# Patient Record
Sex: Male | Born: 1950 | Race: White | Hispanic: No | Marital: Married | Smoking: Never smoker
Health system: Southern US, Community
[De-identification: ages and names within clinical notes are randomized; demographics above are authoritative.]

## PROBLEM LIST (undated history)

## (undated) DIAGNOSIS — M1990 Unspecified osteoarthritis, unspecified site: Secondary | ICD-10-CM

## (undated) DIAGNOSIS — E119 Type 2 diabetes mellitus without complications: Secondary | ICD-10-CM

## (undated) DIAGNOSIS — R011 Cardiac murmur, unspecified: Secondary | ICD-10-CM

## (undated) DIAGNOSIS — N200 Calculus of kidney: Secondary | ICD-10-CM

## (undated) DIAGNOSIS — K219 Gastro-esophageal reflux disease without esophagitis: Secondary | ICD-10-CM

## (undated) DIAGNOSIS — R42 Dizziness and giddiness: Secondary | ICD-10-CM

## (undated) DIAGNOSIS — N189 Chronic kidney disease, unspecified: Secondary | ICD-10-CM

## (undated) DIAGNOSIS — E785 Hyperlipidemia, unspecified: Secondary | ICD-10-CM

## (undated) DIAGNOSIS — G4730 Sleep apnea, unspecified: Secondary | ICD-10-CM

## (undated) DIAGNOSIS — G5602 Carpal tunnel syndrome, left upper limb: Secondary | ICD-10-CM

## (undated) HISTORY — DX: Hyperlipidemia, unspecified: E78.5

## (undated) HISTORY — PX: CARPAL TUNNEL RELEASE: SHX101

## (undated) HISTORY — DX: Calculus of kidney: N20.0

## (undated) HISTORY — PX: LAMINECTOMY: SHX219

## (undated) HISTORY — DX: Chronic kidney disease, unspecified: N18.9

## (undated) HISTORY — DX: Sleep apnea, unspecified: G47.30

## (undated) HISTORY — PX: OTHER SURGICAL HISTORY: SHX169

---

## 2005-07-13 ENCOUNTER — Ambulatory Visit: Payer: Self-pay | Admitting: Unknown Physician Specialty

## 2005-08-13 ENCOUNTER — Emergency Department: Payer: Self-pay | Admitting: Emergency Medicine

## 2005-11-28 ENCOUNTER — Ambulatory Visit: Payer: Self-pay

## 2007-02-06 ENCOUNTER — Ambulatory Visit: Payer: Self-pay | Admitting: Nephrology

## 2007-03-12 ENCOUNTER — Ambulatory Visit: Payer: Self-pay | Admitting: Neurosurgery

## 2007-05-01 ENCOUNTER — Other Ambulatory Visit: Payer: Self-pay

## 2007-05-01 ENCOUNTER — Ambulatory Visit: Payer: Self-pay | Admitting: Urology

## 2007-05-05 ENCOUNTER — Ambulatory Visit: Payer: Self-pay | Admitting: Urology

## 2009-12-16 ENCOUNTER — Ambulatory Visit: Payer: Self-pay | Admitting: Urology

## 2010-01-16 ENCOUNTER — Ambulatory Visit: Payer: Self-pay | Admitting: Urology

## 2010-02-22 ENCOUNTER — Ambulatory Visit: Payer: Self-pay | Admitting: Urology

## 2010-03-01 ENCOUNTER — Ambulatory Visit: Payer: Self-pay | Admitting: Urology

## 2010-03-09 ENCOUNTER — Ambulatory Visit: Payer: Self-pay | Admitting: Urology

## 2010-04-10 ENCOUNTER — Ambulatory Visit: Payer: Self-pay | Admitting: Urology

## 2010-08-07 ENCOUNTER — Ambulatory Visit: Payer: Self-pay | Admitting: Urology

## 2010-08-15 ENCOUNTER — Ambulatory Visit: Payer: Self-pay | Admitting: Urology

## 2011-02-15 DIAGNOSIS — E669 Obesity, unspecified: Secondary | ICD-10-CM | POA: Insufficient documentation

## 2012-05-01 ENCOUNTER — Ambulatory Visit: Payer: Self-pay | Admitting: Unknown Physician Specialty

## 2012-05-01 LAB — BASIC METABOLIC PANEL
Anion Gap: 6 — ABNORMAL LOW (ref 7–16)
Co2: 26 mmol/L (ref 21–32)
Creatinine: 1.81 mg/dL — ABNORMAL HIGH (ref 0.60–1.30)
EGFR (African American): 46 — ABNORMAL LOW
EGFR (Non-African Amer.): 40 — ABNORMAL LOW
Glucose: 111 mg/dL — ABNORMAL HIGH (ref 65–99)
Osmolality: 280 (ref 275–301)
Potassium: 4.8 mmol/L (ref 3.5–5.1)

## 2012-05-06 ENCOUNTER — Ambulatory Visit: Payer: Self-pay | Admitting: Unknown Physician Specialty

## 2012-06-26 DIAGNOSIS — K589 Irritable bowel syndrome without diarrhea: Secondary | ICD-10-CM | POA: Insufficient documentation

## 2012-06-26 DIAGNOSIS — I152 Hypertension secondary to endocrine disorders: Secondary | ICD-10-CM | POA: Insufficient documentation

## 2012-06-26 DIAGNOSIS — E119 Type 2 diabetes mellitus without complications: Secondary | ICD-10-CM | POA: Insufficient documentation

## 2012-07-30 DIAGNOSIS — E119 Type 2 diabetes mellitus without complications: Secondary | ICD-10-CM | POA: Diagnosis not present

## 2012-07-30 DIAGNOSIS — K862 Cyst of pancreas: Secondary | ICD-10-CM | POA: Diagnosis not present

## 2012-07-30 DIAGNOSIS — N189 Chronic kidney disease, unspecified: Secondary | ICD-10-CM | POA: Diagnosis not present

## 2012-07-30 DIAGNOSIS — Z79899 Other long term (current) drug therapy: Secondary | ICD-10-CM | POA: Diagnosis not present

## 2012-07-30 DIAGNOSIS — K863 Pseudocyst of pancreas: Secondary | ICD-10-CM | POA: Diagnosis not present

## 2012-08-04 DIAGNOSIS — IMO0002 Reserved for concepts with insufficient information to code with codable children: Secondary | ICD-10-CM | POA: Diagnosis not present

## 2012-08-04 DIAGNOSIS — M5137 Other intervertebral disc degeneration, lumbosacral region: Secondary | ICD-10-CM | POA: Diagnosis not present

## 2012-08-04 DIAGNOSIS — M999 Biomechanical lesion, unspecified: Secondary | ICD-10-CM | POA: Diagnosis not present

## 2012-09-24 DIAGNOSIS — Z23 Encounter for immunization: Secondary | ICD-10-CM | POA: Diagnosis not present

## 2012-10-09 DIAGNOSIS — L821 Other seborrheic keratosis: Secondary | ICD-10-CM | POA: Diagnosis not present

## 2012-10-09 DIAGNOSIS — L919 Hypertrophic disorder of the skin, unspecified: Secondary | ICD-10-CM | POA: Diagnosis not present

## 2012-10-09 DIAGNOSIS — L909 Atrophic disorder of skin, unspecified: Secondary | ICD-10-CM | POA: Diagnosis not present

## 2012-10-09 DIAGNOSIS — L570 Actinic keratosis: Secondary | ICD-10-CM | POA: Diagnosis not present

## 2012-11-03 DIAGNOSIS — L821 Other seborrheic keratosis: Secondary | ICD-10-CM | POA: Diagnosis not present

## 2012-11-03 DIAGNOSIS — L570 Actinic keratosis: Secondary | ICD-10-CM | POA: Diagnosis not present

## 2012-11-03 DIAGNOSIS — L909 Atrophic disorder of skin, unspecified: Secondary | ICD-10-CM | POA: Diagnosis not present

## 2012-11-10 DIAGNOSIS — I10 Essential (primary) hypertension: Secondary | ICD-10-CM | POA: Diagnosis not present

## 2012-11-10 DIAGNOSIS — E119 Type 2 diabetes mellitus without complications: Secondary | ICD-10-CM | POA: Diagnosis not present

## 2012-11-10 DIAGNOSIS — E7800 Pure hypercholesterolemia, unspecified: Secondary | ICD-10-CM | POA: Diagnosis not present

## 2012-12-10 DIAGNOSIS — D485 Neoplasm of uncertain behavior of skin: Secondary | ICD-10-CM | POA: Diagnosis not present

## 2012-12-10 DIAGNOSIS — L820 Inflamed seborrheic keratosis: Secondary | ICD-10-CM | POA: Diagnosis not present

## 2012-12-10 DIAGNOSIS — D2370 Other benign neoplasm of skin of unspecified lower limb, including hip: Secondary | ICD-10-CM | POA: Diagnosis not present

## 2012-12-10 DIAGNOSIS — L821 Other seborrheic keratosis: Secondary | ICD-10-CM | POA: Diagnosis not present

## 2013-01-06 DIAGNOSIS — H6120 Impacted cerumen, unspecified ear: Secondary | ICD-10-CM | POA: Diagnosis not present

## 2013-01-06 DIAGNOSIS — Z0000 Encounter for general adult medical examination without abnormal findings: Secondary | ICD-10-CM | POA: Diagnosis not present

## 2013-01-06 DIAGNOSIS — Z125 Encounter for screening for malignant neoplasm of prostate: Secondary | ICD-10-CM | POA: Diagnosis not present

## 2013-01-06 DIAGNOSIS — N189 Chronic kidney disease, unspecified: Secondary | ICD-10-CM | POA: Diagnosis not present

## 2013-01-06 DIAGNOSIS — B351 Tinea unguium: Secondary | ICD-10-CM | POA: Diagnosis not present

## 2013-01-07 DIAGNOSIS — L905 Scar conditions and fibrosis of skin: Secondary | ICD-10-CM | POA: Diagnosis not present

## 2013-01-07 DIAGNOSIS — L570 Actinic keratosis: Secondary | ICD-10-CM | POA: Diagnosis not present

## 2013-01-07 DIAGNOSIS — L909 Atrophic disorder of skin, unspecified: Secondary | ICD-10-CM | POA: Diagnosis not present

## 2013-01-07 DIAGNOSIS — L919 Hypertrophic disorder of the skin, unspecified: Secondary | ICD-10-CM | POA: Diagnosis not present

## 2013-01-07 DIAGNOSIS — L821 Other seborrheic keratosis: Secondary | ICD-10-CM | POA: Diagnosis not present

## 2013-01-15 DIAGNOSIS — H6500 Acute serous otitis media, unspecified ear: Secondary | ICD-10-CM | POA: Diagnosis not present

## 2013-01-15 DIAGNOSIS — J309 Allergic rhinitis, unspecified: Secondary | ICD-10-CM | POA: Diagnosis not present

## 2013-01-28 DIAGNOSIS — K869 Disease of pancreas, unspecified: Secondary | ICD-10-CM | POA: Diagnosis not present

## 2013-01-28 DIAGNOSIS — K862 Cyst of pancreas: Secondary | ICD-10-CM | POA: Diagnosis not present

## 2013-01-28 DIAGNOSIS — K863 Pseudocyst of pancreas: Secondary | ICD-10-CM | POA: Diagnosis not present

## 2013-02-02 DIAGNOSIS — N183 Chronic kidney disease, stage 3 unspecified: Secondary | ICD-10-CM | POA: Diagnosis not present

## 2013-02-02 DIAGNOSIS — I10 Essential (primary) hypertension: Secondary | ICD-10-CM | POA: Diagnosis not present

## 2013-02-02 DIAGNOSIS — R809 Proteinuria, unspecified: Secondary | ICD-10-CM | POA: Diagnosis not present

## 2013-04-06 DIAGNOSIS — R5381 Other malaise: Secondary | ICD-10-CM | POA: Diagnosis not present

## 2013-04-06 DIAGNOSIS — I889 Nonspecific lymphadenitis, unspecified: Secondary | ICD-10-CM | POA: Diagnosis not present

## 2013-04-06 DIAGNOSIS — J309 Allergic rhinitis, unspecified: Secondary | ICD-10-CM | POA: Diagnosis not present

## 2013-06-23 DIAGNOSIS — M5137 Other intervertebral disc degeneration, lumbosacral region: Secondary | ICD-10-CM | POA: Diagnosis not present

## 2013-06-23 DIAGNOSIS — M999 Biomechanical lesion, unspecified: Secondary | ICD-10-CM | POA: Diagnosis not present

## 2013-06-23 DIAGNOSIS — IMO0002 Reserved for concepts with insufficient information to code with codable children: Secondary | ICD-10-CM | POA: Diagnosis not present

## 2013-06-24 DIAGNOSIS — E119 Type 2 diabetes mellitus without complications: Secondary | ICD-10-CM | POA: Diagnosis not present

## 2013-06-24 DIAGNOSIS — E785 Hyperlipidemia, unspecified: Secondary | ICD-10-CM | POA: Diagnosis not present

## 2013-06-24 DIAGNOSIS — Z79899 Other long term (current) drug therapy: Secondary | ICD-10-CM | POA: Diagnosis not present

## 2013-06-24 DIAGNOSIS — I10 Essential (primary) hypertension: Secondary | ICD-10-CM | POA: Diagnosis not present

## 2013-06-24 DIAGNOSIS — E559 Vitamin D deficiency, unspecified: Secondary | ICD-10-CM | POA: Diagnosis not present

## 2013-07-08 DIAGNOSIS — G729 Myopathy, unspecified: Secondary | ICD-10-CM | POA: Diagnosis not present

## 2013-07-08 DIAGNOSIS — M7710 Lateral epicondylitis, unspecified elbow: Secondary | ICD-10-CM | POA: Diagnosis not present

## 2013-07-22 DIAGNOSIS — E559 Vitamin D deficiency, unspecified: Secondary | ICD-10-CM | POA: Diagnosis not present

## 2013-07-22 DIAGNOSIS — IMO0001 Reserved for inherently not codable concepts without codable children: Secondary | ICD-10-CM | POA: Diagnosis not present

## 2013-07-22 DIAGNOSIS — E785 Hyperlipidemia, unspecified: Secondary | ICD-10-CM | POA: Diagnosis not present

## 2013-07-22 DIAGNOSIS — I10 Essential (primary) hypertension: Secondary | ICD-10-CM | POA: Diagnosis not present

## 2013-09-02 DIAGNOSIS — I10 Essential (primary) hypertension: Secondary | ICD-10-CM | POA: Diagnosis not present

## 2013-09-02 DIAGNOSIS — IMO0001 Reserved for inherently not codable concepts without codable children: Secondary | ICD-10-CM | POA: Diagnosis not present

## 2013-09-02 DIAGNOSIS — E119 Type 2 diabetes mellitus without complications: Secondary | ICD-10-CM | POA: Diagnosis not present

## 2013-09-02 DIAGNOSIS — N189 Chronic kidney disease, unspecified: Secondary | ICD-10-CM | POA: Diagnosis not present

## 2013-09-02 DIAGNOSIS — E785 Hyperlipidemia, unspecified: Secondary | ICD-10-CM | POA: Diagnosis not present

## 2013-09-02 DIAGNOSIS — Z23 Encounter for immunization: Secondary | ICD-10-CM | POA: Diagnosis not present

## 2013-10-20 DIAGNOSIS — N183 Chronic kidney disease, stage 3 unspecified: Secondary | ICD-10-CM | POA: Diagnosis not present

## 2013-10-27 DIAGNOSIS — G4733 Obstructive sleep apnea (adult) (pediatric): Secondary | ICD-10-CM | POA: Diagnosis not present

## 2013-10-27 DIAGNOSIS — I129 Hypertensive chronic kidney disease with stage 1 through stage 4 chronic kidney disease, or unspecified chronic kidney disease: Secondary | ICD-10-CM | POA: Diagnosis not present

## 2013-10-27 DIAGNOSIS — N183 Chronic kidney disease, stage 3 unspecified: Secondary | ICD-10-CM | POA: Diagnosis not present

## 2013-10-27 DIAGNOSIS — E1129 Type 2 diabetes mellitus with other diabetic kidney complication: Secondary | ICD-10-CM | POA: Diagnosis not present

## 2013-12-09 DIAGNOSIS — L819 Disorder of pigmentation, unspecified: Secondary | ICD-10-CM | POA: Diagnosis not present

## 2013-12-09 DIAGNOSIS — L570 Actinic keratosis: Secondary | ICD-10-CM | POA: Diagnosis not present

## 2013-12-09 DIAGNOSIS — L821 Other seborrheic keratosis: Secondary | ICD-10-CM | POA: Diagnosis not present

## 2014-01-08 DIAGNOSIS — Z1331 Encounter for screening for depression: Secondary | ICD-10-CM | POA: Diagnosis not present

## 2014-01-08 DIAGNOSIS — H9190 Unspecified hearing loss, unspecified ear: Secondary | ICD-10-CM | POA: Diagnosis not present

## 2014-01-08 DIAGNOSIS — B350 Tinea barbae and tinea capitis: Secondary | ICD-10-CM | POA: Diagnosis not present

## 2014-01-08 DIAGNOSIS — Z125 Encounter for screening for malignant neoplasm of prostate: Secondary | ICD-10-CM | POA: Diagnosis not present

## 2014-01-08 DIAGNOSIS — J309 Allergic rhinitis, unspecified: Secondary | ICD-10-CM | POA: Diagnosis not present

## 2014-01-08 DIAGNOSIS — Z0000 Encounter for general adult medical examination without abnormal findings: Secondary | ICD-10-CM | POA: Diagnosis not present

## 2014-01-08 DIAGNOSIS — H6120 Impacted cerumen, unspecified ear: Secondary | ICD-10-CM | POA: Diagnosis not present

## 2014-01-27 DIAGNOSIS — R935 Abnormal findings on diagnostic imaging of other abdominal regions, including retroperitoneum: Secondary | ICD-10-CM | POA: Diagnosis not present

## 2014-01-27 DIAGNOSIS — R7989 Other specified abnormal findings of blood chemistry: Secondary | ICD-10-CM | POA: Diagnosis not present

## 2014-01-27 DIAGNOSIS — K862 Cyst of pancreas: Secondary | ICD-10-CM | POA: Diagnosis not present

## 2014-01-27 DIAGNOSIS — K863 Pseudocyst of pancreas: Secondary | ICD-10-CM | POA: Diagnosis not present

## 2014-02-04 DIAGNOSIS — H6500 Acute serous otitis media, unspecified ear: Secondary | ICD-10-CM | POA: Diagnosis not present

## 2014-02-09 DIAGNOSIS — D1801 Hemangioma of skin and subcutaneous tissue: Secondary | ICD-10-CM | POA: Diagnosis not present

## 2014-02-09 DIAGNOSIS — L819 Disorder of pigmentation, unspecified: Secondary | ICD-10-CM | POA: Diagnosis not present

## 2014-02-09 DIAGNOSIS — L821 Other seborrheic keratosis: Secondary | ICD-10-CM | POA: Diagnosis not present

## 2014-02-09 DIAGNOSIS — L570 Actinic keratosis: Secondary | ICD-10-CM | POA: Diagnosis not present

## 2014-03-15 DIAGNOSIS — L570 Actinic keratosis: Secondary | ICD-10-CM | POA: Diagnosis not present

## 2014-03-15 DIAGNOSIS — L820 Inflamed seborrheic keratosis: Secondary | ICD-10-CM | POA: Diagnosis not present

## 2014-03-26 DIAGNOSIS — N183 Chronic kidney disease, stage 3 unspecified: Secondary | ICD-10-CM | POA: Diagnosis not present

## 2014-03-26 DIAGNOSIS — E1129 Type 2 diabetes mellitus with other diabetic kidney complication: Secondary | ICD-10-CM | POA: Diagnosis not present

## 2014-03-26 DIAGNOSIS — E785 Hyperlipidemia, unspecified: Secondary | ICD-10-CM | POA: Diagnosis not present

## 2014-03-26 DIAGNOSIS — I10 Essential (primary) hypertension: Secondary | ICD-10-CM | POA: Diagnosis not present

## 2014-03-26 DIAGNOSIS — I129 Hypertensive chronic kidney disease with stage 1 through stage 4 chronic kidney disease, or unspecified chronic kidney disease: Secondary | ICD-10-CM | POA: Diagnosis not present

## 2014-03-26 DIAGNOSIS — E119 Type 2 diabetes mellitus without complications: Secondary | ICD-10-CM | POA: Diagnosis not present

## 2014-04-12 DIAGNOSIS — L570 Actinic keratosis: Secondary | ICD-10-CM | POA: Diagnosis not present

## 2014-04-12 DIAGNOSIS — L909 Atrophic disorder of skin, unspecified: Secondary | ICD-10-CM | POA: Diagnosis not present

## 2014-04-12 DIAGNOSIS — L821 Other seborrheic keratosis: Secondary | ICD-10-CM | POA: Diagnosis not present

## 2014-04-12 DIAGNOSIS — L919 Hypertrophic disorder of the skin, unspecified: Secondary | ICD-10-CM | POA: Diagnosis not present

## 2014-04-21 DIAGNOSIS — E1129 Type 2 diabetes mellitus with other diabetic kidney complication: Secondary | ICD-10-CM | POA: Diagnosis not present

## 2014-04-21 DIAGNOSIS — N183 Chronic kidney disease, stage 3 unspecified: Secondary | ICD-10-CM | POA: Diagnosis not present

## 2014-04-27 DIAGNOSIS — N184 Chronic kidney disease, stage 4 (severe): Secondary | ICD-10-CM | POA: Diagnosis not present

## 2014-04-27 DIAGNOSIS — E1129 Type 2 diabetes mellitus with other diabetic kidney complication: Secondary | ICD-10-CM | POA: Diagnosis not present

## 2014-04-27 DIAGNOSIS — N058 Unspecified nephritic syndrome with other morphologic changes: Secondary | ICD-10-CM | POA: Diagnosis not present

## 2014-05-24 DIAGNOSIS — L820 Inflamed seborrheic keratosis: Secondary | ICD-10-CM | POA: Diagnosis not present

## 2014-05-24 DIAGNOSIS — L570 Actinic keratosis: Secondary | ICD-10-CM | POA: Diagnosis not present

## 2014-10-25 DIAGNOSIS — N184 Chronic kidney disease, stage 4 (severe): Secondary | ICD-10-CM | POA: Diagnosis not present

## 2014-10-25 DIAGNOSIS — E1129 Type 2 diabetes mellitus with other diabetic kidney complication: Secondary | ICD-10-CM | POA: Diagnosis not present

## 2014-11-01 DIAGNOSIS — R809 Proteinuria, unspecified: Secondary | ICD-10-CM | POA: Diagnosis not present

## 2014-11-01 DIAGNOSIS — I129 Hypertensive chronic kidney disease with stage 1 through stage 4 chronic kidney disease, or unspecified chronic kidney disease: Secondary | ICD-10-CM | POA: Diagnosis not present

## 2014-11-01 DIAGNOSIS — N183 Chronic kidney disease, stage 3 (moderate): Secondary | ICD-10-CM | POA: Diagnosis not present

## 2014-11-01 DIAGNOSIS — E1129 Type 2 diabetes mellitus with other diabetic kidney complication: Secondary | ICD-10-CM | POA: Diagnosis not present

## 2015-02-02 DIAGNOSIS — R7989 Other specified abnormal findings of blood chemistry: Secondary | ICD-10-CM | POA: Diagnosis not present

## 2015-02-02 DIAGNOSIS — K869 Disease of pancreas, unspecified: Secondary | ICD-10-CM | POA: Diagnosis not present

## 2015-02-02 DIAGNOSIS — K760 Fatty (change of) liver, not elsewhere classified: Secondary | ICD-10-CM | POA: Diagnosis not present

## 2015-02-02 DIAGNOSIS — K863 Pseudocyst of pancreas: Secondary | ICD-10-CM | POA: Diagnosis not present

## 2015-02-02 DIAGNOSIS — K862 Cyst of pancreas: Secondary | ICD-10-CM | POA: Diagnosis not present

## 2015-03-22 DIAGNOSIS — E119 Type 2 diabetes mellitus without complications: Secondary | ICD-10-CM | POA: Diagnosis not present

## 2015-03-22 DIAGNOSIS — K3184 Gastroparesis: Secondary | ICD-10-CM | POA: Diagnosis not present

## 2015-03-22 DIAGNOSIS — E1129 Type 2 diabetes mellitus with other diabetic kidney complication: Secondary | ICD-10-CM | POA: Diagnosis not present

## 2015-03-22 DIAGNOSIS — R109 Unspecified abdominal pain: Secondary | ICD-10-CM | POA: Diagnosis not present

## 2015-03-22 DIAGNOSIS — E785 Hyperlipidemia, unspecified: Secondary | ICD-10-CM | POA: Diagnosis not present

## 2015-03-22 DIAGNOSIS — K219 Gastro-esophageal reflux disease without esophagitis: Secondary | ICD-10-CM | POA: Diagnosis not present

## 2015-03-22 DIAGNOSIS — N183 Chronic kidney disease, stage 3 (moderate): Secondary | ICD-10-CM | POA: Diagnosis not present

## 2015-03-22 DIAGNOSIS — I129 Hypertensive chronic kidney disease with stage 1 through stage 4 chronic kidney disease, or unspecified chronic kidney disease: Secondary | ICD-10-CM | POA: Diagnosis not present

## 2015-04-20 DIAGNOSIS — N529 Male erectile dysfunction, unspecified: Secondary | ICD-10-CM | POA: Diagnosis not present

## 2015-04-20 DIAGNOSIS — J309 Allergic rhinitis, unspecified: Secondary | ICD-10-CM | POA: Diagnosis not present

## 2015-04-20 DIAGNOSIS — R109 Unspecified abdominal pain: Secondary | ICD-10-CM | POA: Diagnosis not present

## 2015-04-20 DIAGNOSIS — K219 Gastro-esophageal reflux disease without esophagitis: Secondary | ICD-10-CM | POA: Diagnosis not present

## 2015-05-02 DIAGNOSIS — N183 Chronic kidney disease, stage 3 (moderate): Secondary | ICD-10-CM | POA: Diagnosis not present

## 2015-05-04 DIAGNOSIS — N183 Chronic kidney disease, stage 3 (moderate): Secondary | ICD-10-CM | POA: Diagnosis not present

## 2015-05-04 DIAGNOSIS — I129 Hypertensive chronic kidney disease with stage 1 through stage 4 chronic kidney disease, or unspecified chronic kidney disease: Secondary | ICD-10-CM | POA: Diagnosis not present

## 2015-05-04 DIAGNOSIS — E559 Vitamin D deficiency, unspecified: Secondary | ICD-10-CM | POA: Diagnosis not present

## 2015-05-04 DIAGNOSIS — Z6836 Body mass index (BMI) 36.0-36.9, adult: Secondary | ICD-10-CM | POA: Diagnosis not present

## 2015-06-07 DIAGNOSIS — E1129 Type 2 diabetes mellitus with other diabetic kidney complication: Secondary | ICD-10-CM | POA: Insufficient documentation

## 2015-06-07 DIAGNOSIS — N529 Male erectile dysfunction, unspecified: Secondary | ICD-10-CM | POA: Insufficient documentation

## 2015-06-07 DIAGNOSIS — N200 Calculus of kidney: Secondary | ICD-10-CM

## 2015-06-07 DIAGNOSIS — E559 Vitamin D deficiency, unspecified: Secondary | ICD-10-CM

## 2015-06-07 DIAGNOSIS — J309 Allergic rhinitis, unspecified: Secondary | ICD-10-CM

## 2015-06-07 DIAGNOSIS — K219 Gastro-esophageal reflux disease without esophagitis: Secondary | ICD-10-CM

## 2015-06-07 DIAGNOSIS — E785 Hyperlipidemia, unspecified: Secondary | ICD-10-CM

## 2015-06-07 DIAGNOSIS — M545 Low back pain: Secondary | ICD-10-CM

## 2015-06-07 DIAGNOSIS — I129 Hypertensive chronic kidney disease with stage 1 through stage 4 chronic kidney disease, or unspecified chronic kidney disease: Secondary | ICD-10-CM

## 2015-06-07 DIAGNOSIS — E1169 Type 2 diabetes mellitus with other specified complication: Secondary | ICD-10-CM | POA: Insufficient documentation

## 2015-06-07 DIAGNOSIS — E1140 Type 2 diabetes mellitus with diabetic neuropathy, unspecified: Secondary | ICD-10-CM | POA: Insufficient documentation

## 2015-06-07 DIAGNOSIS — B359 Dermatophytosis, unspecified: Secondary | ICD-10-CM

## 2015-06-07 DIAGNOSIS — N183 Chronic kidney disease, stage 3 unspecified: Secondary | ICD-10-CM

## 2015-06-07 DIAGNOSIS — N411 Chronic prostatitis: Secondary | ICD-10-CM

## 2015-06-07 DIAGNOSIS — E1322 Other specified diabetes mellitus with diabetic chronic kidney disease: Secondary | ICD-10-CM

## 2015-06-07 DIAGNOSIS — M961 Postlaminectomy syndrome, not elsewhere classified: Secondary | ICD-10-CM | POA: Insufficient documentation

## 2015-06-15 ENCOUNTER — Ambulatory Visit: Payer: Self-pay | Admitting: Unknown Physician Specialty

## 2015-07-19 ENCOUNTER — Ambulatory Visit (INDEPENDENT_AMBULATORY_CARE_PROVIDER_SITE_OTHER): Payer: Medicare Other | Admitting: Family Medicine

## 2015-07-19 ENCOUNTER — Encounter: Payer: Self-pay | Admitting: Family Medicine

## 2015-07-19 VITALS — BP 138/81 | HR 93 | Temp 98.7°F | Ht 66.2 in | Wt 227.9 lb

## 2015-07-19 DIAGNOSIS — G4733 Obstructive sleep apnea (adult) (pediatric): Secondary | ICD-10-CM

## 2015-07-19 DIAGNOSIS — J0110 Acute frontal sinusitis, unspecified: Secondary | ICD-10-CM

## 2015-07-19 DIAGNOSIS — Z9989 Dependence on other enabling machines and devices: Secondary | ICD-10-CM

## 2015-07-19 MED ORDER — AMOXICILLIN-POT CLAVULANATE 875-125 MG PO TABS: 1.0000 | ORAL_TABLET | Freq: Two times a day (BID) | ORAL | Status: DC

## 2015-07-19 MED ORDER — HYDROCOD POLST-CPM POLST ER 10-8 MG/5ML PO SUER: 5.0000 mL | Freq: Two times a day (BID) | ORAL | Status: DC | PRN

## 2015-07-19 NOTE — Assessment & Plan Note (Signed)
Needs refill on mask and tubing. Rx given today.

## 2015-07-19 NOTE — Progress Notes (Signed)
BP 138/81 mmHg  Pulse 93  Temp(Src) 98.7 F (37.1 C)  Ht 5' 6.2" (1.681 m)  Wt 227 lb 14.4 oz (103.375 kg)  BMI 36.58 kg/m2  SpO2 97%   Subjective:    Patient ID: Thomas Huang, male    DOB: August 30, 1951, 64 y.o.   MRN: [id number]  HPI: Thomas Huang is a 64 y.o. male  Chief Complaint  Patient presents with  . URI    X 3.5 weeks, sinus pressure, sore throat, earache, cough, congestion. Used otc cough meds   UPPER RESPIRATORY TRACT INFECTION x3.5 weeks, started with a headaches, then went into his ears, now congested everywhere and getting worse Worst symptom: cough Fever: no Cough: yes Shortness of breath: no Wheezing: no Chest pain: no Chest tightness: no Chest congestion: yes Nasal congestion: yes Runny nose: yes Post nasal drip: yes Sneezing: no Sore throat: yes Swollen glands: yes Sinus pressure: yes Headache: yes Face pain: yes Toothache: no Ear pain: yes "right Ear pressure: yes "right Eyes red/itching:no Eye drainage/crusting: no  Vomiting: no Rash: no Fatigue: yes Sick contacts: no Strep contacts: no  Context: worse Recurrent sinusitis: yes Relief with OTC cold/cough medications: no  Treatments attempted: cold/sinus, mucinex, anti-histamine, pseudoephedrine and cough syrup   Relevant past medical, surgical, family and social history reviewed and updated as indicated. Interim medical history since our last visit reviewed. Allergies and medications reviewed and updated.  Review of Systems  Constitutional: Negative.   HENT: Positive for congestion, ear pain, postnasal drip, rhinorrhea, sinus pressure, sneezing and sore throat. Negative for dental problem, drooling, ear discharge, facial swelling, hearing loss, mouth sores, nosebleeds, tinnitus, trouble swallowing and voice change.   Respiratory: Negative.   Cardiovascular: Negative.   Psychiatric/Behavioral: Negative.     Per HPI unless specifically indicated above     Objective:    BP 138/81  mmHg  Pulse 93  Temp(Src) 98.7 F (37.1 C)  Ht 5' 6.2" (1.681 m)  Wt 227 lb 14.4 oz (103.375 kg)  BMI 36.58 kg/m2  SpO2 97%  Wt Readings from Last 3 Encounters:  07/19/15 227 lb 14.4 oz (103.375 kg)  04/20/15 225 lb (102.059 kg)    Physical Exam  Constitutional: He is oriented to person, place, and time. He appears well-developed and well-nourished. No distress.  HENT:  Head: Normocephalic and atraumatic.  Right Ear: Hearing, tympanic membrane, external ear and ear canal normal.  Left Ear: Hearing, tympanic membrane, external ear and ear canal normal.  Nose: Mucosal edema, rhinorrhea and sinus tenderness present. No nose lacerations, nasal deformity, septal deviation or nasal septal hematoma. No epistaxis.  No foreign bodies. Right sinus exhibits no maxillary sinus tenderness and no frontal sinus tenderness. Left sinus exhibits frontal sinus tenderness. Left sinus exhibits no maxillary sinus tenderness.  Mouth/Throat: Uvula is midline and oropharynx is clear and moist. No oropharyngeal exudate.  Eyes: Conjunctivae, EOM and lids are normal. Pupils are equal, round, and reactive to light. Right eye exhibits no discharge. Left eye exhibits no discharge. No scleral icterus.  Cardiovascular: Normal rate, regular rhythm, normal heart sounds and intact distal pulses.  Exam reveals no gallop and no friction rub.   No murmur heard. Pulmonary/Chest: Effort normal and breath sounds normal. No respiratory distress. He has no wheezes. He has no rales. He exhibits no tenderness.  Musculoskeletal: Normal range of motion.  Neurological: He is alert and oriented to person, place, and time.  Skin: Skin is warm, dry and intact. No rash noted. No erythema. No  pallor.  Psychiatric: He has a normal mood and affect. His speech is normal and behavior is normal. Judgment and thought content normal. Cognition and memory are normal.  Nursing note and vitals reviewed.       Assessment & Plan:   Problem List  Items Addressed This Visit      Respiratory   OSA on CPAP    Needs refill on mask and tubing. Rx given today.       Other Visit Diagnoses    Acute frontal sinusitis, recurrence not specified    -  Primary    Will treat with augmentin and tussionex for comfort. Call if not improving. Continue to monitor.     Relevant Medications    amoxicillin-clavulanate (AUGMENTIN) 875-125 MG per tablet    chlorpheniramine-HYDROcodone (TUSSIONEX PENNKINETIC ER) 10-8 MG/5ML SUER        Follow up plan: Return if symptoms worsen or fail to improve.

## 2015-08-31 DIAGNOSIS — M4806 Spinal stenosis, lumbar region: Secondary | ICD-10-CM | POA: Diagnosis not present

## 2015-09-08 ENCOUNTER — Other Ambulatory Visit: Payer: Self-pay | Admitting: Family Medicine

## 2015-09-08 DIAGNOSIS — M25552 Pain in left hip: Secondary | ICD-10-CM | POA: Diagnosis not present

## 2015-09-08 DIAGNOSIS — M7061 Trochanteric bursitis, right hip: Secondary | ICD-10-CM | POA: Diagnosis not present

## 2015-09-08 DIAGNOSIS — M25551 Pain in right hip: Secondary | ICD-10-CM | POA: Diagnosis not present

## 2015-09-08 DIAGNOSIS — M7062 Trochanteric bursitis, left hip: Secondary | ICD-10-CM | POA: Diagnosis not present

## 2015-09-08 DIAGNOSIS — M5416 Radiculopathy, lumbar region: Secondary | ICD-10-CM | POA: Diagnosis not present

## 2015-09-08 DIAGNOSIS — M545 Low back pain: Secondary | ICD-10-CM | POA: Diagnosis not present

## 2015-09-12 DIAGNOSIS — M25551 Pain in right hip: Secondary | ICD-10-CM | POA: Diagnosis not present

## 2015-09-12 DIAGNOSIS — M5416 Radiculopathy, lumbar region: Secondary | ICD-10-CM | POA: Diagnosis not present

## 2015-09-12 DIAGNOSIS — M25552 Pain in left hip: Secondary | ICD-10-CM | POA: Diagnosis not present

## 2015-09-15 DIAGNOSIS — M7061 Trochanteric bursitis, right hip: Secondary | ICD-10-CM | POA: Diagnosis not present

## 2015-09-15 DIAGNOSIS — M545 Low back pain: Secondary | ICD-10-CM | POA: Diagnosis not present

## 2015-09-15 DIAGNOSIS — M7062 Trochanteric bursitis, left hip: Secondary | ICD-10-CM | POA: Diagnosis not present

## 2015-09-15 DIAGNOSIS — M5417 Radiculopathy, lumbosacral region: Secondary | ICD-10-CM | POA: Diagnosis not present

## 2015-09-19 DIAGNOSIS — M545 Low back pain: Secondary | ICD-10-CM | POA: Diagnosis not present

## 2015-09-28 ENCOUNTER — Other Ambulatory Visit: Payer: Self-pay | Admitting: Family Medicine

## 2015-09-28 NOTE — Telephone Encounter (Signed)
Call for an apt

## 2015-09-29 NOTE — Telephone Encounter (Signed)
L/M for pt to call and scheduled appt.

## 2015-09-30 NOTE — Telephone Encounter (Signed)
Pt scheduled  

## 2015-10-03 DIAGNOSIS — M25551 Pain in right hip: Secondary | ICD-10-CM | POA: Diagnosis not present

## 2015-10-03 DIAGNOSIS — M5416 Radiculopathy, lumbar region: Secondary | ICD-10-CM | POA: Diagnosis not present

## 2015-10-03 DIAGNOSIS — M25552 Pain in left hip: Secondary | ICD-10-CM | POA: Diagnosis not present

## 2015-10-05 ENCOUNTER — Other Ambulatory Visit: Payer: Self-pay | Admitting: Specialist

## 2015-10-05 DIAGNOSIS — M5416 Radiculopathy, lumbar region: Secondary | ICD-10-CM

## 2015-10-05 DIAGNOSIS — M71559 Other bursitis, not elsewhere classified, unspecified hip: Secondary | ICD-10-CM | POA: Diagnosis not present

## 2015-10-06 ENCOUNTER — Encounter: Payer: Self-pay | Admitting: Family Medicine

## 2015-10-06 ENCOUNTER — Ambulatory Visit (INDEPENDENT_AMBULATORY_CARE_PROVIDER_SITE_OTHER): Payer: Medicare Other | Admitting: Family Medicine

## 2015-10-06 VITALS — BP 118/69 | HR 76 | Temp 98.6°F | Ht 66.0 in | Wt 224.0 lb

## 2015-10-06 DIAGNOSIS — I129 Hypertensive chronic kidney disease with stage 1 through stage 4 chronic kidney disease, or unspecified chronic kidney disease: Secondary | ICD-10-CM | POA: Diagnosis not present

## 2015-10-06 DIAGNOSIS — S61401A Unspecified open wound of right hand, initial encounter: Secondary | ICD-10-CM

## 2015-10-06 DIAGNOSIS — Z87828 Personal history of other (healed) physical injury and trauma: Secondary | ICD-10-CM | POA: Diagnosis not present

## 2015-10-06 DIAGNOSIS — E1122 Type 2 diabetes mellitus with diabetic chronic kidney disease: Secondary | ICD-10-CM

## 2015-10-06 DIAGNOSIS — Z23 Encounter for immunization: Secondary | ICD-10-CM | POA: Diagnosis not present

## 2015-10-06 DIAGNOSIS — J329 Chronic sinusitis, unspecified: Secondary | ICD-10-CM

## 2015-10-06 DIAGNOSIS — E785 Hyperlipidemia, unspecified: Secondary | ICD-10-CM

## 2015-10-06 DIAGNOSIS — N183 Chronic kidney disease, stage 3 (moderate): Secondary | ICD-10-CM | POA: Diagnosis not present

## 2015-10-06 LAB — LIPID PANEL PICCOLO, WAIVED
CHOLESTEROL PICCOLO, WAIVED: 158 mg/dL (ref <200)
Chol/HDL Ratio Piccolo,Waive: 3.8 mg/dL
HDL CHOL PICCOLO, WAIVED: 41 mg/dL — AB (ref >59)
LDL CHOL CALC PICCOLO WAIVED: 93 mg/dL (ref <100)
Triglycerides Piccolo,Waived: 120 mg/dL (ref <150)
VLDL Chol Calc Piccolo,Waive: 24 mg/dL (ref <30)

## 2015-10-06 LAB — BAYER DCA HB A1C WAIVED: HB A1C (BAYER DCA - WAIVED): 7 % — ABNORMAL HIGH (ref <7.0)

## 2015-10-06 LAB — MICROALBUMIN, URINE WAIVED
Creatinine, Urine Waived: 100 mg/dL (ref 10–300)
Microalb, Ur Waived: 80 mg/L — ABNORMAL HIGH (ref 0–19)

## 2015-10-06 MED ORDER — SILDENAFIL CITRATE 20 MG PO TABS: 20.0000 mg | ORAL_TABLET | Freq: Once | ORAL | Status: DC | PRN

## 2015-10-06 MED ORDER — AMOXICILLIN-POT CLAVULANATE 875-125 MG PO TABS
1.0000 | ORAL_TABLET | Freq: Two times a day (BID) | ORAL | Status: DC
Start: 2015-10-06 — End: 2016-02-02

## 2015-10-06 MED ORDER — GLIMEPIRIDE 2 MG PO TABS: 2.0000 mg | ORAL_TABLET | Freq: Every day | ORAL | Status: DC

## 2015-10-06 NOTE — Assessment & Plan Note (Signed)
The current medical regimen is effective;  continue present plan and medications.  

## 2015-10-06 NOTE — Progress Notes (Signed)
BP 118/69 mmHg  Pulse 76  Temp(Src) 98.6 F (37 C)  Ht 5\' 6"  (1.676 m)  Wt 224 lb (101.606 kg)  BMI 36.17 kg/m2  SpO2 98%   Subjective:    Patient ID: Thomas Huang, male    DOB: 01-10-1951, 64 y.o.   MRN: [id number]  HPI: [name] is a 64 y.o. male  Chief Complaint  Patient presents with  . Hypertension  . Hyperlipidemia  . Diabetes   recheck multiple medical issues blood pressures doing well with no complaints from medications no side effects Cholesterol taking medications without problems or side effects Diabetes good control at home except for yesterday after had steroid injection for hip pain where he is seeing orthopedics Patient also sinus infections and this last summer, little bit better after treatment but is been intermittently problematic with sloshing headache especially and frontal sinuses which is been ongoing intermittently all fall. On has not taken any over-the-counter medications  Relevant past medical, surgical, family and social history reviewed and updated as indicated. Interim medical history since our last visit reviewed. Allergies and medications reviewed and updated.  Review of Systems  Constitutional: Negative.   HENT: Positive for congestion, postnasal drip, rhinorrhea, sinus pressure and sore throat. Negative for ear pain, facial swelling and hearing loss.   Respiratory: Negative.   Cardiovascular: Negative.     Per HPI unless specifically indicated above     Objective:    BP 118/69 mmHg  Pulse 76  Temp(Src) 98.6 F (37 C)  Ht 5\' 6"  (1.676 m)  Wt 224 lb (101.606 kg)  BMI 36.17 kg/m2  SpO2 98%  Wt Readings from Last 3 Encounters:  10/06/15 224 lb (101.606 kg)  07/19/15 227 lb 14.4 oz (103.375 kg)  04/20/15 225 lb (102.059 kg)    Physical Exam  Constitutional: He is oriented to person, place, and time. He appears well-developed and well-nourished. No distress.  HENT:  Head: Normocephalic and atraumatic.  Right Ear: Hearing  and external ear normal.  Left Ear: Hearing and external ear normal.  Nose: Nose normal.  Mouth/Throat: Oropharynx is clear and moist.  Eyes: Conjunctivae and lids are normal. Right eye exhibits no discharge. Left eye exhibits no discharge. No scleral icterus.  Neck: Normal range of motion.  Cardiovascular: Normal rate, regular rhythm and normal heart sounds.   Pulmonary/Chest: Effort normal and breath sounds normal. No respiratory distress.  Musculoskeletal: Normal range of motion.  Patient with scrape on the right hand happened 3 days ago appears clean and well but needs a tetanus shot.  Lymphadenopathy:    He has no cervical adenopathy.  Neurological: He is alert and oriented to person, place, and time.  Skin: Skin is intact. No rash noted.  Psychiatric: He has a normal mood and affect. His speech is normal and behavior is normal. Judgment and thought content normal. Cognition and memory are normal.        Assessment & Plan:   Problem List Items Addressed This Visit      Respiratory   Sinusitis    Discussed sinusitis care and treatment restart Augmentin for another 10 days Discussed treating nasal allergies faithfully with Flonase which patient has Also use Allegra or Claritin      Relevant Medications   amoxicillin-clavulanate (AUGMENTIN) 875-125 MG tablet     Endocrine   Diabetes mellitus with renal manifestation ([place]) - Primary    The current medical regimen is effective;  continue present plan and medications.  Relevant Medications   glimepiride (AMARYL) 2 MG tablet   Other Relevant Orders   Bayer DCA Hb A1c Waived     Genitourinary   Hypertensive CKD (chronic kidney disease)    The current medical regimen is effective;  continue present plan and medications.         Other   Hyperlipidemia    The current medical regimen is effective;  continue present plan and medications.       Relevant Medications   sildenafil (REVATIO) 20 MG tablet   Hx of  injury   H/O open hand wound    Healing well will need tetanus shot       Other Visit Diagnoses    Parenchymal renal hypertension, stage 1-4 or unspecified chronic kidney disease        Relevant Medications    sildenafil (REVATIO) 20 MG tablet    Other Relevant Orders    Comprehensive metabolic panel    Microalbumin, Urine Waived    Uric acid    Hyperlipemia        Relevant Medications    sildenafil (REVATIO) 20 MG tablet    Other Relevant Orders    Lipid Panel Piccolo, Waived    Immunization due        Relevant Orders    Flu Vaccine QUAD 36+ mos PF IM (Fluarix & Fluzone Quad PF) (Completed)        Follow up plan: Return in about 3 months (around 01/06/2016), or if symptoms worsen or fail to improve, for Physical Exam and A1c.

## 2015-10-06 NOTE — Assessment & Plan Note (Signed)
Healing well will need tetanus shot 

## 2015-10-06 NOTE — Assessment & Plan Note (Signed)
Discussed sinusitis care and treatment restart Augmentin for another 10 days Discussed treating nasal allergies faithfully with Flonase which patient has Also use Allegra or Claritin

## 2015-10-07 LAB — URIC ACID: Uric Acid: 7.3 mg/dL (ref 3.7–8.6)

## 2015-10-07 LAB — COMPREHENSIVE METABOLIC PANEL
A/G RATIO: 1.6 (ref 1.1–2.5)
ALT: 26 IU/L (ref 0–44)
AST: 22 IU/L (ref 0–40)
Albumin: 4.3 g/dL (ref 3.6–4.8)
Alkaline Phosphatase: 65 IU/L (ref 39–117)
BUN / CREAT RATIO: 16 (ref 10–22)
BUN: 35 mg/dL — ABNORMAL HIGH (ref 8–27)
Bilirubin Total: 0.4 mg/dL (ref 0.0–1.2)
CALCIUM: 9.4 mg/dL (ref 8.6–10.2)
CO2: 20 mmol/L (ref 18–29)
Chloride: 99 mmol/L (ref 97–106)
Creatinine, Ser: 2.13 mg/dL — ABNORMAL HIGH (ref 0.76–1.27)
GFR, EST AFRICAN AMERICAN: 37 mL/min/{1.73_m2} — AB (ref >59)
GFR, EST NON AFRICAN AMERICAN: 32 mL/min/{1.73_m2} — AB (ref >59)
GLOBULIN, TOTAL: 2.7 g/dL (ref 1.5–4.5)
Glucose: 211 mg/dL — ABNORMAL HIGH (ref 65–99)
POTASSIUM: 4.7 mmol/L (ref 3.5–5.2)
SODIUM: 137 mmol/L (ref 136–144)
TOTAL PROTEIN: 7 g/dL (ref 6.0–8.5)

## 2015-10-19 ENCOUNTER — Ambulatory Visit
Admission: RE | Admit: 2015-10-19 | Discharge: 2015-10-19 | Disposition: A | Discharge status: Home / Self Care | Payer: Medicare Other | Source: Ambulatory Visit | Attending: Specialist | Admitting: Specialist

## 2015-10-19 DIAGNOSIS — N289 Disorder of kidney and ureter, unspecified: Secondary | ICD-10-CM | POA: Insufficient documentation

## 2015-10-19 DIAGNOSIS — M5136 Other intervertebral disc degeneration, lumbar region: Secondary | ICD-10-CM | POA: Diagnosis not present

## 2015-10-19 DIAGNOSIS — M5416 Radiculopathy, lumbar region: Secondary | ICD-10-CM

## 2015-10-19 DIAGNOSIS — M5137 Other intervertebral disc degeneration, lumbosacral region: Secondary | ICD-10-CM | POA: Insufficient documentation

## 2015-10-19 DIAGNOSIS — M47816 Spondylosis without myelopathy or radiculopathy, lumbar region: Secondary | ICD-10-CM | POA: Insufficient documentation

## 2015-10-19 DIAGNOSIS — N261 Atrophy of kidney (terminal): Secondary | ICD-10-CM | POA: Diagnosis not present

## 2015-10-24 DIAGNOSIS — M4806 Spinal stenosis, lumbar region: Secondary | ICD-10-CM | POA: Diagnosis not present

## 2015-10-31 DIAGNOSIS — I10 Essential (primary) hypertension: Secondary | ICD-10-CM | POA: Diagnosis not present

## 2015-10-31 DIAGNOSIS — E559 Vitamin D deficiency, unspecified: Secondary | ICD-10-CM | POA: Diagnosis not present

## 2015-10-31 DIAGNOSIS — N183 Chronic kidney disease, stage 3 (moderate): Secondary | ICD-10-CM | POA: Diagnosis not present

## 2015-11-09 DIAGNOSIS — E611 Iron deficiency: Secondary | ICD-10-CM | POA: Diagnosis not present

## 2015-11-09 DIAGNOSIS — Z6836 Body mass index (BMI) 36.0-36.9, adult: Secondary | ICD-10-CM | POA: Diagnosis not present

## 2015-11-09 DIAGNOSIS — N183 Chronic kidney disease, stage 3 (moderate): Secondary | ICD-10-CM | POA: Diagnosis not present

## 2015-11-09 DIAGNOSIS — D631 Anemia in chronic kidney disease: Secondary | ICD-10-CM | POA: Diagnosis not present

## 2015-11-09 DIAGNOSIS — I129 Hypertensive chronic kidney disease with stage 1 through stage 4 chronic kidney disease, or unspecified chronic kidney disease: Secondary | ICD-10-CM | POA: Diagnosis not present

## 2015-11-16 ENCOUNTER — Telehealth: Payer: Self-pay

## 2015-11-16 DIAGNOSIS — M4806 Spinal stenosis, lumbar region: Secondary | ICD-10-CM | POA: Diagnosis not present

## 2015-11-16 NOTE — Telephone Encounter (Signed)
We need to find out where this patient had a Sleep Study done.  We are getting a request from [facility] requesting a copy but I cannot find one.  I did look at all scanned studies in [facility], but no sleep study.

## 2015-11-16 NOTE — Telephone Encounter (Signed)
Please call this patient and inform him that in order for [place] to order CPAP supplies he is going to need a new sleep study done.  In order to have the study done he must have a face to face visit for insurance.  He has a CPE scheduled for March, he can choose to wait until then or get an appointment sooner if he cant wait.  Thank you

## 2015-11-16 NOTE — Telephone Encounter (Signed)
Pt said he had sleep study done at 20 years ago at [facility].

## 2015-12-08 DIAGNOSIS — M5416 Radiculopathy, lumbar region: Secondary | ICD-10-CM | POA: Diagnosis not present

## 2015-12-08 DIAGNOSIS — M5117 Intervertebral disc disorders with radiculopathy, lumbosacral region: Secondary | ICD-10-CM | POA: Diagnosis not present

## 2015-12-08 DIAGNOSIS — M4807 Spinal stenosis, lumbosacral region: Secondary | ICD-10-CM | POA: Diagnosis not present

## 2015-12-12 ENCOUNTER — Other Ambulatory Visit: Payer: Self-pay | Admitting: Family Medicine

## 2016-01-11 DIAGNOSIS — M4806 Spinal stenosis, lumbar region: Secondary | ICD-10-CM | POA: Diagnosis not present

## 2016-01-25 ENCOUNTER — Encounter: Payer: Medicare Other | Admitting: Family Medicine

## 2016-02-02 ENCOUNTER — Ambulatory Visit (INDEPENDENT_AMBULATORY_CARE_PROVIDER_SITE_OTHER): Payer: Medicare HMO | Admitting: Family Medicine

## 2016-02-02 ENCOUNTER — Encounter: Payer: Self-pay | Admitting: Family Medicine

## 2016-02-02 VITALS — BP 125/74 | HR 92 | Temp 99.0°F | Ht 65.7 in | Wt 224.0 lb

## 2016-02-02 DIAGNOSIS — J019 Acute sinusitis, unspecified: Secondary | ICD-10-CM | POA: Diagnosis not present

## 2016-02-02 MED ORDER — AMOXICILLIN-POT CLAVULANATE 875-125 MG PO TABS: 1.0000 | ORAL_TABLET | Freq: Two times a day (BID) | ORAL | Status: DC

## 2016-02-02 MED ORDER — AZELASTINE-FLUTICASONE 137-50 MCG/ACT NA SUSP: 2.0000 | Freq: Two times a day (BID) | NASAL | Status: DC

## 2016-02-02 NOTE — Progress Notes (Signed)
BP 125/74 mmHg  Pulse 92  Temp(Src) 99 F (37.2 C)  Ht 5' 5.7" (1.669 m)  Wt 224 lb (101.606 kg)  BMI 36.48 kg/m2  SpO2 97%   Subjective:    Patient ID: Thomas Huang, male    DOB: 1951-02-05, 65 y.o.   MRN: [id number]  HPI: Thomas Huang is a 65 y.o. male  Chief Complaint  Patient presents with  . testical swelling  . URI   patient with complaints of head cold ongoing over this last month but never really getting better from this winter when he took Augmentin. Had some intermittent head congestion especially left frontal sinus area Has used Flonase on occasional nasal rinse Sudafed and Allegra Has some chronic right ear eustachian tube issues which is been okay  Patient also noticed left testicle swelling above his testicle and epididymis area this got swollen tender about the size of a marble then spontaneously resolved with no further pain or symptoms yesterday.  Relevant past medical, surgical, family and social history reviewed and updated as indicated. Interim medical history since our last visit reviewed. Allergies and medications reviewed and updated.  Review of Systems  Constitutional: Negative.   HENT: Positive for congestion, postnasal drip, rhinorrhea, sinus pressure, sneezing, sore throat and voice change.   Respiratory: Positive for cough. Negative for choking and shortness of breath.   Cardiovascular: Negative.   Genitourinary: Negative for dysuria, urgency, frequency, hematuria, flank pain, discharge, penile swelling and difficulty urinating.    Per HPI unless specifically indicated above     Objective:    BP 125/74 mmHg  Pulse 92  Temp(Src) 99 F (37.2 C)  Ht 5' 5.7" (1.669 m)  Wt 224 lb (101.606 kg)  BMI 36.48 kg/m2  SpO2 97%  Wt Readings from Last 3 Encounters:  02/02/16 224 lb (101.606 kg)  10/19/15 220 lb (99.791 kg)  10/06/15 224 lb (101.606 kg)    Physical Exam  Constitutional: He is oriented to person, place, and time. He appears  well-developed and well-nourished. No distress.  HENT:  Head: Normocephalic and atraumatic.  Right Ear: Hearing and external ear normal.  Left Ear: Hearing and external ear normal.  Nose: Nose normal.  Mouth/Throat: Oropharyngeal exudate present.  Eyes: Conjunctivae and lids are normal. Right eye exhibits no discharge. Left eye exhibits no discharge. No scleral icterus.  Cardiovascular: Normal rate, regular rhythm and normal heart sounds.   Pulmonary/Chest: Effort normal and breath sounds normal. No respiratory distress.  Genitourinary:  Testicles normal epididymis area normal no evidence of hernia  Musculoskeletal: Normal range of motion.  Neurological: He is alert and oriented to person, place, and time.  Skin: Skin is intact. No rash noted.  Psychiatric: He has a normal mood and affect. His speech is normal and behavior is normal. Judgment and thought content normal. Cognition and memory are normal.    Results for orders placed or performed in visit on 10/06/15  Bayer DCA Hb A1c Waived  Result Value Ref Range   Bayer DCA Hb A1c Waived 7.0 (H) <7.0 %  Lipid Panel Piccolo, Waived  Result Value Ref Range   Cholesterol Piccolo, Waived 158 <200 mg/dL   HDL Chol Piccolo, Waived 41 (L) >59 mg/dL   Triglycerides Piccolo,Waived 120 <150 mg/dL   Chol/HDL Ratio Piccolo,Waive 3.8 mg/dL   LDL Chol Calc Piccolo Waived 93 <100 mg/dL   VLDL Chol Calc Piccolo,Waive 24 <30 mg/dL  Comprehensive metabolic panel  Result Value Ref Range   Glucose 211 (H)  65 - 99 mg/dL   BUN 35 (H) 8 - 27 mg/dL   Creatinine, Ser 2.13 (H) 0.76 - 1.27 mg/dL   GFR calc non Af Amer 32 (L) >59 mL/min/1.73   GFR calc Af Amer 37 (L) >59 mL/min/1.73   BUN/Creatinine Ratio 16 10 - 22   Sodium 137 136 - 144 mmol/L   Potassium 4.7 3.5 - 5.2 mmol/L   Chloride 99 97 - 106 mmol/L   CO2 20 18 - 29 mmol/L   Calcium 9.4 8.6 - 10.2 mg/dL   Total Protein 7.0 6.0 - 8.5 g/dL   Albumin 4.3 3.6 - 4.8 g/dL   Globulin, Total 2.7 1.5  - 4.5 g/dL   Albumin/Globulin Ratio 1.6 1.1 - 2.5   Bilirubin Total 0.4 0.0 - 1.2 mg/dL   Alkaline Phosphatase 65 39 - 117 IU/L   AST 22 0 - 40 IU/L   ALT 26 0 - 44 IU/L  Microalbumin, Urine Waived  Result Value Ref Range   Microalb, Ur Waived 80 (H) 0 - 19 mg/L   Creatinine, Urine Waived 100 10 - 300 mg/dL   Microalb/Creat Ratio 30-300 (H) <30 mg/g  Uric acid  Result Value Ref Range   Uric Acid 7.3 3.7 - 8.6 mg/dL      Assessment & Plan:   Problem List Items Addressed This Visit      Respiratory   Sinusitis - Primary   Relevant Medications   amoxicillin-clavulanate (AUGMENTIN) 875-125 MG tablet   Azelastine-Fluticasone 137-50 MCG/ACT SUSP      Observe testicle area most likely had cystocele that spontaneously resolved  Follow up plan: Return for As scheduled, Physical Exam.

## 2016-02-22 DIAGNOSIS — K862 Cyst of pancreas: Secondary | ICD-10-CM | POA: Diagnosis not present

## 2016-02-22 DIAGNOSIS — C252 Malignant neoplasm of tail of pancreas: Secondary | ICD-10-CM | POA: Diagnosis not present

## 2016-02-22 DIAGNOSIS — K863 Pseudocyst of pancreas: Secondary | ICD-10-CM | POA: Diagnosis not present

## 2016-03-12 ENCOUNTER — Other Ambulatory Visit: Payer: Self-pay | Admitting: Unknown Physician Specialty

## 2016-04-20 ENCOUNTER — Other Ambulatory Visit: Payer: Self-pay | Admitting: Family Medicine

## 2016-04-24 ENCOUNTER — Encounter: Payer: Self-pay | Admitting: Family Medicine

## 2016-04-24 NOTE — Telephone Encounter (Signed)
Apt pe 

## 2016-04-24 NOTE — Telephone Encounter (Signed)
Letter sent.

## 2016-04-28 ENCOUNTER — Other Ambulatory Visit: Payer: Self-pay | Admitting: Family Medicine

## 2016-05-02 DIAGNOSIS — E611 Iron deficiency: Secondary | ICD-10-CM | POA: Diagnosis not present

## 2016-05-02 DIAGNOSIS — I10 Essential (primary) hypertension: Secondary | ICD-10-CM | POA: Diagnosis not present

## 2016-05-02 DIAGNOSIS — N183 Chronic kidney disease, stage 3 (moderate): Secondary | ICD-10-CM | POA: Diagnosis not present

## 2016-05-09 DIAGNOSIS — Z6836 Body mass index (BMI) 36.0-36.9, adult: Secondary | ICD-10-CM | POA: Diagnosis not present

## 2016-05-09 DIAGNOSIS — I129 Hypertensive chronic kidney disease with stage 1 through stage 4 chronic kidney disease, or unspecified chronic kidney disease: Secondary | ICD-10-CM | POA: Diagnosis not present

## 2016-05-09 DIAGNOSIS — N183 Chronic kidney disease, stage 3 (moderate): Secondary | ICD-10-CM | POA: Diagnosis not present

## 2016-05-09 DIAGNOSIS — D631 Anemia in chronic kidney disease: Secondary | ICD-10-CM | POA: Diagnosis not present

## 2016-05-09 DIAGNOSIS — E559 Vitamin D deficiency, unspecified: Secondary | ICD-10-CM | POA: Diagnosis not present

## 2016-05-25 ENCOUNTER — Other Ambulatory Visit: Payer: Self-pay | Admitting: Family Medicine

## 2016-06-04 ENCOUNTER — Other Ambulatory Visit: Payer: Self-pay | Admitting: Unknown Physician Specialty

## 2016-06-25 ENCOUNTER — Ambulatory Visit (INDEPENDENT_AMBULATORY_CARE_PROVIDER_SITE_OTHER): Payer: Medicare HMO | Admitting: Family Medicine

## 2016-06-25 ENCOUNTER — Encounter: Payer: Self-pay | Admitting: Family Medicine

## 2016-06-25 VITALS — BP 111/70 | HR 78 | Temp 98.1°F | Ht 66.1 in | Wt 224.0 lb

## 2016-06-25 DIAGNOSIS — Z0000 Encounter for general adult medical examination without abnormal findings: Secondary | ICD-10-CM | POA: Diagnosis not present

## 2016-06-25 DIAGNOSIS — E785 Hyperlipidemia, unspecified: Secondary | ICD-10-CM

## 2016-06-25 DIAGNOSIS — I129 Hypertensive chronic kidney disease with stage 1 through stage 4 chronic kidney disease, or unspecified chronic kidney disease: Secondary | ICD-10-CM

## 2016-06-25 DIAGNOSIS — E1322 Other specified diabetes mellitus with diabetic chronic kidney disease: Secondary | ICD-10-CM | POA: Diagnosis not present

## 2016-06-25 LAB — BAYER DCA HB A1C WAIVED: HB A1C (BAYER DCA - WAIVED): 7.6 % — ABNORMAL HIGH (ref <7.0)

## 2016-06-25 LAB — LP+ALT+AST PICCOLO, WAIVED
ALT (SGPT) Piccolo, Waived: 31 U/L (ref 10–47)
AST (SGOT) PICCOLO, WAIVED: 30 U/L (ref 11–38)
CHOL/HDL RATIO PICCOLO,WAIVE: 4.6 mg/dL
Cholesterol Piccolo, Waived: 140 mg/dL (ref <200)
HDL Chol Piccolo, Waived: 30 mg/dL — ABNORMAL LOW (ref >59)
LDL Chol Calc Piccolo Waived: 69 mg/dL (ref <100)
TRIGLYCERIDES PICCOLO,WAIVED: 204 mg/dL — AB (ref <150)
VLDL Chol Calc Piccolo,Waive: 41 mg/dL — ABNORMAL HIGH (ref <30)

## 2016-06-25 MED ORDER — ATORVASTATIN CALCIUM 10 MG PO TABS: 10.0000 mg | ORAL_TABLET | Freq: Every day | ORAL | 0 refills | Status: DC

## 2016-06-25 MED ORDER — LISINOPRIL 10 MG PO TABS: 10.0000 mg | ORAL_TABLET | Freq: Every day | ORAL | 0 refills | Status: DC

## 2016-06-25 MED ORDER — OMEPRAZOLE 20 MG PO CPDR: 20.0000 mg | DELAYED_RELEASE_CAPSULE | Freq: Every day | ORAL | 0 refills | Status: DC

## 2016-06-25 MED ORDER — GLIMEPIRIDE 2 MG PO TABS: 2.0000 mg | ORAL_TABLET | Freq: Every day | ORAL | 0 refills | Status: DC

## 2016-06-25 NOTE — Progress Notes (Signed)
BP 111/70 (BP Location: Left Arm, Patient Position: Sitting, Cuff Size: Normal)   Pulse 78   Temp 98.1 F (36.7 C)   Ht 5' 6.1" (1.679 m)   Wt 224 lb (101.6 kg)   SpO2 97%   BMI 36.05 kg/m    Subjective:    Patient ID: Thomas Huang, male    DOB: [birth year], 65 y.o.   MRN: [id number]  HPI: Thomas Huang is a 65 y.o. male  Patient follow-up doing well with medications no complaints. Getting good reports from kidney doctors on stage III CK D which is been stable for 3 years. Taking lisinopril for renal protection and blood pressure without problems Cholesterol well controlled on Lipitor Diabetes with no low blood sugar spells or issues taking glyburide 2 mg without problems  Patient also wants shingles vaccine is discussed here or at drug stores  Relevant past medical, surgical, family and social history reviewed and updated as indicated. Interim medical history since our last visit reviewed. Allergies and medications reviewed and updated.  Review of Systems  Constitutional: Negative.   Respiratory: Negative.     Per HPI unless specifically indicated above     Objective:    BP 111/70 (BP Location: Left Arm, Patient Position: Sitting, Cuff Size: Normal)   Pulse 78   Temp 98.1 F (36.7 C)   Ht 5' 6.1" (1.679 m)   Wt 224 lb (101.6 kg)   SpO2 97%   BMI 36.05 kg/m   Wt Readings from Last 3 Encounters:  06/25/16 224 lb (101.6 kg)  02/02/16 224 lb (101.6 kg)  10/19/15 220 lb (99.8 kg)    Physical Exam  Constitutional: He is oriented to person, place, and time. He appears well-developed and well-nourished. No distress.  HENT:  Head: Normocephalic and atraumatic.  Right Ear: Hearing normal.  Left Ear: Hearing normal.  Nose: Nose normal.  Eyes: Conjunctivae and lids are normal. Right eye exhibits no discharge. Left eye exhibits no discharge. No scleral icterus.  Cardiovascular: Normal rate, regular rhythm and normal heart sounds.   Pulmonary/Chest: Effort normal and  breath sounds normal. No respiratory distress.  Musculoskeletal: Normal range of motion.  Neurological: He is alert and oriented to person, place, and time.  Skin: Skin is intact. No rash noted.  Psychiatric: He has a normal mood and affect. His speech is normal and behavior is normal. Judgment and thought content normal. Cognition and memory are normal.    Results for orders placed or performed in visit on 10/06/15  Bayer DCA Hb A1c Waived  Result Value Ref Range   Bayer DCA Hb A1c Waived 7.0 (H) <7.0 %  Lipid Panel Piccolo, Waived  Result Value Ref Range   Cholesterol Piccolo, Waived 158 <200 mg/dL   HDL Chol Piccolo, Waived 41 (L) >59 mg/dL   Triglycerides Piccolo,Waived 120 <150 mg/dL   Chol/HDL Ratio Piccolo,Waive 3.8 mg/dL   LDL Chol Calc Piccolo Waived 93 <100 mg/dL   VLDL Chol Calc Piccolo,Waive 24 <30 mg/dL  Comprehensive metabolic panel  Result Value Ref Range   Glucose 211 (H) 65 - 99 mg/dL   BUN 35 (H) 8 - 27 mg/dL   Creatinine, Ser 2.13 (H) 0.76 - 1.27 mg/dL   GFR calc non Af Amer 32 (L) >59 mL/min/1.73   GFR calc Af Amer 37 (L) >59 mL/min/1.73   BUN/Creatinine Ratio 16 10 - 22   Sodium 137 136 - 144 mmol/L   Potassium 4.7 3.5 - 5.2 mmol/L   Chloride  99 97 - 106 mmol/L   CO2 20 18 - 29 mmol/L   Calcium 9.4 8.6 - 10.2 mg/dL   Total Protein 7.0 6.0 - 8.5 g/dL   Albumin 4.3 3.6 - 4.8 g/dL   Globulin, Total 2.7 1.5 - 4.5 g/dL   Albumin/Globulin Ratio 1.6 1.1 - 2.5   Bilirubin Total 0.4 0.0 - 1.2 mg/dL   Alkaline Phosphatase 65 39 - 117 IU/L   AST 22 0 - 40 IU/L   ALT 26 0 - 44 IU/L  Microalbumin, Urine Waived  Result Value Ref Range   Microalb, Ur Waived 80 (H) 0 - 19 mg/L   Creatinine, Urine Waived 100 10 - 300 mg/dL   Microalb/Creat Ratio 30-300 (H) <30 mg/g  Uric acid  Result Value Ref Range   Uric Acid 7.3 3.7 - 8.6 mg/dL      Assessment & Plan:   Problem List Items Addressed This Visit      Endocrine   Diabetes mellitus with renal manifestation  ([place]) - Primary    Discuss poor control patient will work With diet exercise nutrition weight loss to gain back control      Relevant Medications   atorvastatin (LIPITOR) 10 MG tablet   glimepiride (AMARYL) 2 MG tablet   lisinopril (PRINIVIL,ZESTRIL) 10 MG tablet   Other Relevant Orders   Bayer DCA Hb A1c Waived     Genitourinary   Hypertensive CKD (chronic kidney disease)    The current medical regimen is effective;  continue present plan and medications.         Other   Hyperlipidemia    The current medical regimen is effective;  continue present plan and medications.       Relevant Medications   atorvastatin (LIPITOR) 10 MG tablet   lisinopril (PRINIVIL,ZESTRIL) 10 MG tablet   Other Relevant Orders   LP+ALT+AST Piccolo, Waived    Other Visit Diagnoses    Healthcare maintenance       Relevant Orders   Hepatitis C Antibody       Follow up plan: Return in about 3 months (around 09/25/2016) for Physical Exam and a1c.

## 2016-06-25 NOTE — Assessment & Plan Note (Signed)
The current medical regimen is effective;  continue present plan and medications.  

## 2016-06-25 NOTE — Assessment & Plan Note (Signed)
Discuss poor control patient will work With diet exercise nutrition weight loss to gain back control

## 2016-06-26 ENCOUNTER — Encounter: Payer: Self-pay | Admitting: Family Medicine

## 2016-06-26 LAB — HEPATITIS C ANTIBODY: Hep C Virus Ab: 0.1 s/co ratio (ref 0.0–0.9)

## 2016-07-09 ENCOUNTER — Ambulatory Visit (INDEPENDENT_AMBULATORY_CARE_PROVIDER_SITE_OTHER): Payer: Medicare HMO | Admitting: Family Medicine

## 2016-07-09 ENCOUNTER — Encounter: Payer: Self-pay | Admitting: Family Medicine

## 2016-07-09 VITALS — BP 110/65 | HR 73 | Temp 98.4°F | Ht 66.5 in | Wt 222.0 lb

## 2016-07-09 DIAGNOSIS — Z0000 Encounter for general adult medical examination without abnormal findings: Secondary | ICD-10-CM

## 2016-07-09 DIAGNOSIS — R010 Benign and innocent cardiac murmurs: Secondary | ICD-10-CM | POA: Diagnosis not present

## 2016-07-09 DIAGNOSIS — E1129 Type 2 diabetes mellitus with other diabetic kidney complication: Secondary | ICD-10-CM | POA: Diagnosis not present

## 2016-07-09 DIAGNOSIS — I358 Other nonrheumatic aortic valve disorders: Secondary | ICD-10-CM | POA: Insufficient documentation

## 2016-07-09 DIAGNOSIS — R011 Cardiac murmur, unspecified: Secondary | ICD-10-CM

## 2016-07-09 DIAGNOSIS — E0821 Diabetes mellitus due to underlying condition with diabetic nephropathy: Secondary | ICD-10-CM | POA: Diagnosis not present

## 2016-07-09 DIAGNOSIS — E785 Hyperlipidemia, unspecified: Secondary | ICD-10-CM | POA: Diagnosis not present

## 2016-07-09 LAB — URINALYSIS, ROUTINE W REFLEX MICROSCOPIC
Bilirubin, UA: NEGATIVE
Glucose, UA: NEGATIVE
Ketones, UA: NEGATIVE
LEUKOCYTES UA: NEGATIVE
Nitrite, UA: NEGATIVE
PH UA: 5.5 (ref 5.0–7.5)
Specific Gravity, UA: 1.015 (ref 1.005–1.030)
Urobilinogen, Ur: 0.2 mg/dL (ref 0.2–1.0)

## 2016-07-09 LAB — MICROSCOPIC EXAMINATION

## 2016-07-09 MED ORDER — GLIMEPIRIDE 2 MG PO TABS: 2.0000 mg | ORAL_TABLET | Freq: Every day | ORAL | 4 refills | Status: DC

## 2016-07-09 MED ORDER — OMEPRAZOLE 20 MG PO CPDR: 20.0000 mg | DELAYED_RELEASE_CAPSULE | Freq: Every day | ORAL | 4 refills | Status: DC

## 2016-07-09 MED ORDER — LISINOPRIL 10 MG PO TABS: 10.0000 mg | ORAL_TABLET | Freq: Every day | ORAL | 4 refills | Status: DC

## 2016-07-09 MED ORDER — ATORVASTATIN CALCIUM 10 MG PO TABS: 10.0000 mg | ORAL_TABLET | Freq: Every day | ORAL | 4 refills | Status: DC

## 2016-07-09 NOTE — Progress Notes (Signed)
BP 110/65 (BP Location: Left Arm, Patient Position: Sitting, Cuff Size: Normal)   Pulse 73   Temp 98.4 F (36.9 C)   Ht 5' 6.5" (1.689 m)   Wt 222 lb (100.7 kg)   SpO2 98%   BMI 35.29 kg/m    Subjective:    Patient ID: Thomas Huang, male    DOB: 06-07-1951, 65 y.o.   MRN: [id number]  HPI: Thomas Huang is a 65 y.o. male  Chief Complaint  Patient presents with  . Annual Exam  Patient follow-up has done better with diabetes home glucose checks getting good glucose numbers now has cut back bread and sweets's feeling better and noticed a good response with dietary changes. No change was made in medications.    Relevant past medical, surgical, family and social history reviewed and updated as indicated. Interim medical history since our last visit reviewed. Allergies and medications reviewed and updated.  Review of Systems  Constitutional: Negative.   HENT: Negative.   Eyes: Negative.   Respiratory: Negative.   Cardiovascular: Negative.   Gastrointestinal: Negative.   Endocrine: Negative.   Genitourinary: Negative.   Musculoskeletal: Negative.   Skin: Negative.   Allergic/Immunologic: Negative.   Neurological: Negative.   Hematological: Negative.   Psychiatric/Behavioral: Negative.     Per HPI unless specifically indicated above     Objective:    BP 110/65 (BP Location: Left Arm, Patient Position: Sitting, Cuff Size: Normal)   Pulse 73   Temp 98.4 F (36.9 C)   Ht 5' 6.5" (1.689 m)   Wt 222 lb (100.7 kg)   SpO2 98%   BMI 35.29 kg/m   Wt Readings from Last 3 Encounters:  07/09/16 222 lb (100.7 kg)  06/25/16 224 lb (101.6 kg)  02/02/16 224 lb (101.6 kg)    Physical Exam  Constitutional: He is oriented to person, place, and time. He appears well-developed and well-nourished.  HENT:  Head: Normocephalic and atraumatic.  Right Ear: External ear normal.  Left Ear: External ear normal.  Eyes: Conjunctivae and EOM are normal. Pupils are equal, round, and  reactive to light.  Neck: Normal range of motion. Neck supple.  Cardiovascular: Normal rate, regular rhythm and intact distal pulses.   Murmur heard. 2/6 SM aortic area  Pulmonary/Chest: Effort normal and breath sounds normal.  Abdominal: Soft. Bowel sounds are normal. There is no splenomegaly or hepatomegaly.  Genitourinary: Rectum normal, prostate normal and penis normal.  Musculoskeletal: Normal range of motion.  Neurological: He is alert and oriented to person, place, and time. He has normal reflexes.  Skin: No rash noted. No erythema.  Psychiatric: He has a normal mood and affect. His behavior is normal. Judgment and thought content normal.    Results for orders placed or performed in visit on 06/25/16  Hepatitis C Antibody  Result Value Ref Range   Hep C Virus Ab 0.1 0.0 - 0.9 s/co ratio  Bayer DCA Hb A1c Waived  Result Value Ref Range   Bayer DCA Hb A1c Waived 7.6 (H) <7.0 %  LP+ALT+AST Piccolo, Waived  Result Value Ref Range   ALT (SGPT) Piccolo, Waived 31 10 - 47 U/L   AST (SGOT) Piccolo, Waived 30 11 - 38 U/L   Cholesterol Piccolo, Waived 140 <200 mg/dL   HDL Chol Piccolo, Waived 30 (L) >59 mg/dL   Triglycerides Piccolo,Waived 204 (H) <150 mg/dL   Chol/HDL Ratio Piccolo,Waive 4.6 mg/dL   LDL Chol Calc Piccolo Waived 69 <100 mg/dL   VLDL  Chol Calc Piccolo,Waive 41 (H) <30 mg/dL      Assessment & Plan:   Problem List Items Addressed This Visit      Endocrine   Diabetes mellitus with renal manifestation ([place])    Diabetes already improving with dietary modification      Relevant Medications   lisinopril (PRINIVIL,ZESTRIL) 10 MG tablet   glimepiride (AMARYL) 2 MG tablet   atorvastatin (LIPITOR) 10 MG tablet     Other   Heart murmur previously undiagnosed   Relevant Orders   Ambulatory referral to Cardiology    Other Visit Diagnoses    Annual physical exam    -  Primary   Relevant Orders   CBC with Differential/Platelet   Comprehensive metabolic panel    Lipid Panel w/o Chol/HDL Ratio   PSA   TSH   Urinalysis, Routine w reflex microscopic (not at [facility])       Follow up plan: Return in about 2 months (around 09/08/2016), or if symptoms worsen or fail to improve, for Hemoglobin A1c.

## 2016-07-09 NOTE — Assessment & Plan Note (Signed)
Diabetes already improving with dietary modification

## 2016-07-10 ENCOUNTER — Encounter: Payer: Self-pay | Admitting: Family Medicine

## 2016-07-10 LAB — LIPID PANEL W/O CHOL/HDL RATIO
Cholesterol, Total: 115 mg/dL (ref 100–199)
HDL: 28 mg/dL — ABNORMAL LOW (ref >39)
LDL CALC: 62 mg/dL (ref 0–99)
TRIGLYCERIDES: 123 mg/dL (ref 0–149)
VLDL CHOLESTEROL CAL: 25 mg/dL (ref 5–40)

## 2016-07-10 LAB — COMPREHENSIVE METABOLIC PANEL
ALBUMIN: 4 g/dL (ref 3.6–4.8)
ALT: 35 IU/L (ref 0–44)
AST: 27 IU/L (ref 0–40)
Albumin/Globulin Ratio: 1.5 (ref 1.2–2.2)
Alkaline Phosphatase: 68 IU/L (ref 39–117)
BUN / CREAT RATIO: 13 (ref 10–24)
BUN: 27 mg/dL (ref 8–27)
Bilirubin Total: 0.3 mg/dL (ref 0.0–1.2)
CALCIUM: 9.2 mg/dL (ref 8.6–10.2)
CO2: 18 mmol/L (ref 18–29)
CREATININE: 2.12 mg/dL — AB (ref 0.76–1.27)
Chloride: 106 mmol/L (ref 96–106)
GFR calc Af Amer: 37 mL/min/{1.73_m2} — ABNORMAL LOW (ref >59)
GFR, EST NON AFRICAN AMERICAN: 32 mL/min/{1.73_m2} — AB (ref >59)
GLOBULIN, TOTAL: 2.7 g/dL (ref 1.5–4.5)
GLUCOSE: 109 mg/dL — AB (ref 65–99)
Potassium: 4.7 mmol/L (ref 3.5–5.2)
SODIUM: 142 mmol/L (ref 134–144)
Total Protein: 6.7 g/dL (ref 6.0–8.5)

## 2016-07-10 LAB — PSA: PROSTATE SPECIFIC AG, SERUM: 2 ng/mL (ref 0.0–4.0)

## 2016-07-10 LAB — CBC WITH DIFFERENTIAL/PLATELET
Basophils Absolute: 0.1 10*3/uL (ref 0.0–0.2)
Basos: 1 %
EOS (ABSOLUTE): 0.4 10*3/uL (ref 0.0–0.4)
EOS: 7 %
HEMATOCRIT: 36.4 % — AB (ref 37.5–51.0)
HEMOGLOBIN: 12.2 g/dL — AB (ref 12.6–17.7)
IMMATURE GRANULOCYTES: 0 %
Immature Grans (Abs): 0 10*3/uL (ref 0.0–0.1)
LYMPHS: 23 %
Lymphocytes Absolute: 1.2 10*3/uL (ref 0.7–3.1)
MCH: 29.8 pg (ref 26.6–33.0)
MCHC: 33.5 g/dL (ref 31.5–35.7)
MCV: 89 fL (ref 79–97)
MONOCYTES: 9 %
Monocytes Absolute: 0.5 10*3/uL (ref 0.1–0.9)
NEUTROS PCT: 60 %
Neutrophils Absolute: 3.3 10*3/uL (ref 1.4–7.0)
Platelets: 163 10*3/uL (ref 150–379)
RBC: 4.09 x10E6/uL — AB (ref 4.14–5.80)
RDW: 15.6 % — ABNORMAL HIGH (ref 12.3–15.4)
WBC: 5.4 10*3/uL (ref 3.4–10.8)

## 2016-07-10 LAB — TSH: TSH: 2.4 u[IU]/mL (ref 0.450–4.500)

## 2016-07-24 NOTE — Progress Notes (Signed)
Cardiology Office Note   Date:  08/01/2016   ID:  Thomas Huang, DOB Aug 30, 1951, MRN [id number]  Referring Doctor:  Golden Pop, MD   Cardiologist:   Wende Bushy, MD   Reason for consultation:  Chief Complaint  Patient presents with  . Other    Ref by Dr. Jeananne Rama for heart murmur. Meds reviewed by the patient verbally.       History of Present Illness: Thomas Huang is a 65 y.o. male who presents for Evaluation of murmur. During a routine visit with PCP, PCP noted possible murmur. Patient was sent to cardiology for further evaluation.  Patient denies chest pain and shortness breath. He continues to be physically active, works in his yard without any difficulty breathing or chest tightness or chest pain.  No PND, orthopnea, edema.   ROS:  Please see the history of present illness. Aside from mentioned under HPI, all other systems are reviewed and negative.     Past Medical History:  Diagnosis Date  . Chronic kidney disease   . Hyperlipidemia   . Sleep apnea     Past Surgical History:  Procedure Laterality Date  . CARPAL TUNNEL RELEASE    . LAMINECTOMY    . recurrent kidney stones       reports that he has never smoked. He has never used smokeless tobacco. He reports that he does not drink alcohol or use drugs.   family history includes Cancer in his father and mother; Diabetes in his brother, maternal grandmother, mother, and paternal grandmother; Heart attack in his mother; Heart disease in his mother; Hyperlipidemia in his brother; Hypertension in his brother; Stroke in his mother.   Outpatient Medications Prior to Visit  Medication Sig Dispense Refill  . atorvastatin (LIPITOR) 10 MG tablet Take 1 tablet (10 mg total) by mouth daily. 90 tablet 4  . Azelastine-Fluticasone 137-50 MCG/ACT SUSP Place 2 puffs into the nose 2 (two) times daily. 1 Bottle 12  . fexofenadine-pseudoephedrine (ALLEGRA-D) 60-120 MG per tablet Take 1 tablet by mouth 2 (two) times daily.     Marland Kitchen glimepiride (AMARYL) 2 MG tablet Take 1 tablet (2 mg total) by mouth daily with breakfast. 90 tablet 4  . glucose blood test strip 1 each by Other route as needed for other. Use as instructed    . lisinopril (PRINIVIL,ZESTRIL) 10 MG tablet Take 1 tablet (10 mg total) by mouth daily. 90 tablet 4  . omeprazole (PRILOSEC) 20 MG capsule Take 1 capsule (20 mg total) by mouth daily. 90 capsule 4  . polyethylene glycol powder (GLYCOLAX/MIRALAX) powder Take by mouth.    . sildenafil (REVATIO) 20 MG tablet Take 1 tablet (20 mg total) by mouth once as needed. Take 1-5 prn ED 50 tablet 12   No facility-administered medications prior to visit.      Allergies: Nsaids; Pollen extract; and Shellfish allergy    PHYSICAL EXAM: VS:  BP (!) 148/60 (BP Location: Right Arm, Patient Position: Sitting, Cuff Size: Normal)   Pulse 84   Ht 5\' 6"  (1.676 m)   Wt 223 lb (101.2 kg)   BMI 35.99 kg/m  , Body mass index is 35.99 kg/m. Wt Readings from Last 3 Encounters:  08/01/16 223 lb (101.2 kg)  07/09/16 222 lb (100.7 kg)  06/25/16 224 lb (101.6 kg)    GENERAL:  well developed, well nourished, obese, not in acute distress HEENT: normocephalic, pink conjunctivae, anicteric sclerae, no xanthelasma, normal dentition, oropharynx clear NECK:  no neck  vein engorgement, JVP normal, no hepatojugular reflux, carotid upstroke brisk and symmetric, no bruit, no thyromegaly, no lymphadenopathy LUNGS:  good respiratory effort, clear to auscultation bilaterally CV:  PMI not displaced, no thrills, no lifts, S1 and S2 within normal limits, no palpable S3 or S4, 3/6 systolic murmur, no rubs, no gallops ABD:  Soft, nontender, nondistended, normoactive bowel sounds, no abdominal aortic bruit, no hepatomegaly, no splenomegaly MS: nontender back, no kyphosis, no scoliosis, no joint deformities EXT:  2+ DP/PT pulses, no edema, no varicosities, no cyanosis, no clubbing SKIN: warm, nondiaphoretic, normal turgor, no  ulcers NEUROPSYCH: alert, oriented to person, place, and time, sensory/motor grossly intact, normal mood, appropriate affect  Recent Labs: 07/09/2016: ALT 35; BUN 27; Creatinine, Ser 2.12; Platelets 163; Potassium 4.7; Sodium 142; TSH 2.400   Lipid Panel    Component Value Date/Time   CHOL 115 07/09/2016 0953   CHOL 140 06/25/2016 1130   TRIG 123 07/09/2016 0953   HDL 28 (L) 07/09/2016 0953   LDLCALC 62 07/09/2016 0953     Other studies Reviewed:  EKG:  The ekg from 08/01/2016 was personally reviewed by me and it revealed sinus rhythm, 84 BPM, nonspecific ST abnormality.  Additional studies/ records that were reviewed personally reviewed by me today include: None available   ASSESSMENT AND PLAN:  Systolic murmur Recommend echocardiogram.  Hypertension Ideal goal is less than 140/80. Continue blood pressure log. Continue current medications.  Hyperlipidemia Patient on statin therapy. PCP.  Current medicines are reviewed at length with the patient today.  The patient does not have concerns regarding medicines.  Labs/ tests ordered today include:  Orders Placed This Encounter  Procedures  . EKG 12-Lead  . ECHOCARDIOGRAM COMPLETE    I had a lengthy and detailed discussion with the patient regarding diagnoses, prognosis, diagnostic options.   I counseled the patient on importance of lifestyle modification including heart healthy diet, regular physical activity .   Disposition:   FU with undersigned after tests   Signed, Wende Bushy, MD  08/01/2016 12:20 PM    [place]  This note was generated in part with voice recognition software and I apologize for any typographical errors that were not detected and corrected.

## 2016-08-01 ENCOUNTER — Ambulatory Visit (INDEPENDENT_AMBULATORY_CARE_PROVIDER_SITE_OTHER): Payer: Medicare HMO | Admitting: Cardiology

## 2016-08-01 ENCOUNTER — Encounter: Payer: Self-pay | Admitting: Cardiology

## 2016-08-01 VITALS — BP 148/60 | HR 84 | Ht 66.0 in | Wt 223.0 lb

## 2016-08-01 DIAGNOSIS — R011 Cardiac murmur, unspecified: Secondary | ICD-10-CM | POA: Diagnosis not present

## 2016-08-01 DIAGNOSIS — I10 Essential (primary) hypertension: Secondary | ICD-10-CM

## 2016-08-01 DIAGNOSIS — E785 Hyperlipidemia, unspecified: Secondary | ICD-10-CM | POA: Diagnosis not present

## 2016-08-01 NOTE — Patient Instructions (Addendum)
Testing/Procedures: Your physician has requested that you have an echocardiogram. Echocardiography is a painless test that uses sound waves to create images of your heart. It provides your doctor with information about the size and shape of your heart and how well your heart's chambers and valves are working. This procedure takes approximately one hour. There are no restrictions for this procedure.    Follow-Up: Your physician recommends that you schedule a follow-up appointment as needed. We will call you with results and schedule follow up at that time if needed.   It was a pleasure seeing you today here in the office. Please do not hesitate to give [place] a call back if you have any further questions. [place], BSN     Echocardiogram An echocardiogram, or echocardiography, uses sound waves (ultrasound) to produce an image of your heart. The echocardiogram is simple, painless, obtained within a short period of time, and offers valuable information to your health care provider. The images from an echocardiogram can provide information such as:  Evidence of coronary artery disease (CAD).  Heart size.  Heart muscle function.  Heart valve function.  Aneurysm detection.  Evidence of a past heart attack.  Fluid buildup around the heart.  Heart muscle thickening.  Assess heart valve function. LET [facility] CARE PROVIDER KNOW ABOUT:  Any allergies you have.  All medicines you are taking, including vitamins, herbs, eye drops, creams, and over-the-counter medicines.  Previous problems you or members of your family have had with the use of anesthetics.  Any blood disorders you have.  Previous surgeries you have had.  Medical conditions you have.  Possibility of pregnancy, if this applies. BEFORE THE PROCEDURE  No special preparation is needed. Eat and drink normally.  PROCEDURE   In order to produce an image of your heart, gel will be applied to your chest  and a wand-like tool (transducer) will be moved over your chest. The gel will help transmit the sound waves from the transducer. The sound waves will harmlessly bounce off your heart to allow the heart images to be captured in real-time motion. These images will then be recorded.  You may need an IV to receive a medicine that improves the quality of the pictures. AFTER THE PROCEDURE You may return to your normal schedule including diet, activities, and medicines, unless your health care provider tells you otherwise.   This information is not intended to replace advice given to you by your health care provider. Make sure you discuss any questions you have with your health care provider.   Document Released: 11/09/2000 Document Revised: 12/03/2014 Document Reviewed: 07/20/2013 Elsevier Interactive Patient Education [employer].

## 2016-08-03 ENCOUNTER — Ambulatory Visit (INDEPENDENT_AMBULATORY_CARE_PROVIDER_SITE_OTHER): Payer: Medicare HMO

## 2016-08-03 ENCOUNTER — Other Ambulatory Visit: Payer: Self-pay

## 2016-08-03 DIAGNOSIS — R011 Cardiac murmur, unspecified: Secondary | ICD-10-CM | POA: Diagnosis not present

## 2016-09-10 ENCOUNTER — Ambulatory Visit (INDEPENDENT_AMBULATORY_CARE_PROVIDER_SITE_OTHER): Payer: Medicare HMO | Admitting: Family Medicine

## 2016-09-10 ENCOUNTER — Encounter: Payer: Self-pay | Admitting: Family Medicine

## 2016-09-10 VITALS — BP 115/72 | HR 83 | Temp 98.4°F | Wt 219.0 lb

## 2016-09-10 DIAGNOSIS — E0821 Diabetes mellitus due to underlying condition with diabetic nephropathy: Secondary | ICD-10-CM

## 2016-09-10 DIAGNOSIS — I358 Other nonrheumatic aortic valve disorders: Secondary | ICD-10-CM | POA: Diagnosis not present

## 2016-09-10 DIAGNOSIS — E7800 Pure hypercholesterolemia, unspecified: Secondary | ICD-10-CM | POA: Diagnosis not present

## 2016-09-10 DIAGNOSIS — Z23 Encounter for immunization: Secondary | ICD-10-CM

## 2016-09-10 LAB — BAYER DCA HB A1C WAIVED: HB A1C: 6.3 % (ref <7.0)

## 2016-09-10 NOTE — Assessment & Plan Note (Signed)
The current medical regimen is effective;  continue present plan and medications.  

## 2016-09-10 NOTE — Progress Notes (Signed)
BP 115/72   Pulse 83   Temp 98.4 F (36.9 C)   Wt 219 lb (99.3 kg)   SpO2 97%   BMI 35.35 kg/m    Subjective:    Patient ID: Thomas Huang, male    DOB: 1951/07/02, 65 y.o.   MRN: [id number]  HPI: Thomas Huang is a 65 y.o. male  Chief Complaint  Patient presents with  . Diabetes    patient to call and schedule eye exam   Patient doing well with good control of diabetes has lost weight. A1c is coming down more than in the long time. Patient did have one episode of low blood sugar responded well to food. Discussed carrying glucose tablets. So may need to reduce medications. Reviewed aortic valve murmur which was more functional with no significant pathology.  Relevant past medical, surgical, family and social history reviewed and updated as indicated. Interim medical history since our last visit reviewed. Allergies and medications reviewed and updated.  Review of Systems  Constitutional: Negative.   Respiratory: Negative.   Cardiovascular: Negative.     Per HPI unless specifically indicated above     Objective:    BP 115/72   Pulse 83   Temp 98.4 F (36.9 C)   Wt 219 lb (99.3 kg)   SpO2 97%   BMI 35.35 kg/m   Wt Readings from Last 3 Encounters:  09/10/16 219 lb (99.3 kg)  08/01/16 223 lb (101.2 kg)  07/09/16 222 lb (100.7 kg)    Physical Exam  Constitutional: He is oriented to person, place, and time. He appears well-developed and well-nourished. No distress.  HENT:  Head: Normocephalic and atraumatic.  Right Ear: Hearing normal.  Left Ear: Hearing normal.  Nose: Nose normal.  Eyes: Conjunctivae and lids are normal. Right eye exhibits no discharge. Left eye exhibits no discharge. No scleral icterus.  Cardiovascular: Normal rate, regular rhythm and normal heart sounds.   Pulmonary/Chest: Effort normal and breath sounds normal. No respiratory distress.  Musculoskeletal: Normal range of motion.  Neurological: He is alert and oriented to person, place, and  time.  Skin: Skin is intact. No rash noted.  Psychiatric: He has a normal mood and affect. His speech is normal and behavior is normal. Judgment and thought content normal. Cognition and memory are normal.    Results for orders placed or performed in visit on 07/09/16  Microscopic Examination  Result Value Ref Range   WBC, UA 0-5 0 - 5 /hpf   RBC, UA 0-2 0 - 2 /hpf   Epithelial Cells (non renal) 0-10 0 - 10 /hpf   Mucus, UA Present Not Estab.   Bacteria, UA Few None seen/Few  CBC with Differential/Platelet  Result Value Ref Range   WBC 5.4 3.4 - 10.8 x10E3/uL   RBC 4.09 (L) 4.14 - 5.80 x10E6/uL   Hemoglobin 12.2 (L) 12.6 - 17.7 g/dL   Hematocrit 36.4 (L) 37.5 - 51.0 %   MCV 89 79 - 97 fL   MCH 29.8 26.6 - 33.0 pg   MCHC 33.5 31.5 - 35.7 g/dL   RDW 15.6 (H) 12.3 - 15.4 %   Platelets 163 150 - 379 x10E3/uL   Neutrophils 60 %   Lymphs 23 %   Monocytes 9 %   Eos 7 %   Basos 1 %   Neutrophils Absolute 3.3 1.4 - 7.0 x10E3/uL   Lymphocytes Absolute 1.2 0.7 - 3.1 x10E3/uL   Monocytes Absolute 0.5 0.1 - 0.9 x10E3/uL  EOS (ABSOLUTE) 0.4 0.0 - 0.4 x10E3/uL   Basophils Absolute 0.1 0.0 - 0.2 x10E3/uL   Immature Granulocytes 0 %   Immature Grans (Abs) 0.0 0.0 - 0.1 x10E3/uL  Comprehensive metabolic panel  Result Value Ref Range   Glucose 109 (H) 65 - 99 mg/dL   BUN 27 8 - 27 mg/dL   Creatinine, Ser 2.12 (H) 0.76 - 1.27 mg/dL   GFR calc non Af Amer 32 (L) >59 mL/min/1.73   GFR calc Af Amer 37 (L) >59 mL/min/1.73   BUN/Creatinine Ratio 13 10 - 24   Sodium 142 134 - 144 mmol/L   Potassium 4.7 3.5 - 5.2 mmol/L   Chloride 106 96 - 106 mmol/L   CO2 18 18 - 29 mmol/L   Calcium 9.2 8.6 - 10.2 mg/dL   Total Protein 6.7 6.0 - 8.5 g/dL   Albumin 4.0 3.6 - 4.8 g/dL   Globulin, Total 2.7 1.5 - 4.5 g/dL   Albumin/Globulin Ratio 1.5 1.2 - 2.2   Bilirubin Total 0.3 0.0 - 1.2 mg/dL   Alkaline Phosphatase 68 39 - 117 IU/L   AST 27 0 - 40 IU/L   ALT 35 0 - 44 IU/L  Lipid Panel w/o Chol/HDL  Ratio  Result Value Ref Range   Cholesterol, Total 115 100 - 199 mg/dL   Triglycerides 123 0 - 149 mg/dL   HDL 28 (L) >39 mg/dL   VLDL Cholesterol Cal 25 5 - 40 mg/dL   LDL Calculated 62 0 - 99 mg/dL  PSA  Result Value Ref Range   Prostate Specific Ag, Serum 2.0 0.0 - 4.0 ng/mL  TSH  Result Value Ref Range   TSH 2.400 0.450 - 4.500 uIU/mL  Urinalysis, Routine w reflex microscopic (not at [facility])  Result Value Ref Range   Specific Gravity, UA 1.015 1.005 - 1.030   pH, UA 5.5 5.0 - 7.5   Color, UA Yellow Yellow   Appearance Ur Clear Clear   Leukocytes, UA Negative Negative   Protein, UA Trace Negative/Trace   Glucose, UA Negative Negative   Ketones, UA Negative Negative   RBC, UA Trace (A) Negative   Bilirubin, UA Negative Negative   Urobilinogen, Ur 0.2 0.2 - 1.0 mg/dL   Nitrite, UA Negative Negative   Microscopic Examination See below:       Assessment & Plan:   Problem List Items Addressed This Visit      Endocrine   Diabetes mellitus with renal manifestation ([facility]) - Primary    The current medical regimen is effective;  continue present plan and medications.       Relevant Orders   Bayer DCA Hb A1c Waived     Other   Hyperlipidemia    The current medical regimen is effective;  continue present plan and medications.       Aortic heart murmur    Other Visit Diagnoses    Needs flu shot       Encounter for immunization       Relevant Orders   Flu Vaccine QUAD 36+ mos IM (Completed)       Follow up plan: Return in about 3 months (around 12/11/2016) for Hemoglobin A1c, BMP,  Lipids, ALT, AST.

## 2016-09-24 ENCOUNTER — Other Ambulatory Visit: Payer: Self-pay | Admitting: Family Medicine

## 2016-10-01 ENCOUNTER — Ambulatory Visit (INDEPENDENT_AMBULATORY_CARE_PROVIDER_SITE_OTHER): Payer: Medicare HMO | Admitting: Family Medicine

## 2016-10-01 ENCOUNTER — Encounter: Payer: Self-pay | Admitting: Family Medicine

## 2016-10-01 DIAGNOSIS — L02211 Cutaneous abscess of abdominal wall: Secondary | ICD-10-CM | POA: Diagnosis not present

## 2016-10-01 MED ORDER — SULFAMETHOXAZOLE-TRIMETHOPRIM 800-160 MG PO TABS: 1.0000 | ORAL_TABLET | Freq: Two times a day (BID) | ORAL | 0 refills | Status: DC

## 2016-10-01 NOTE — Progress Notes (Signed)
   BP 119/74 (BP Location: Left Arm, Patient Position: Sitting, Cuff Size: Large)   Pulse 86   Temp 98.2 F (36.8 C)   Wt 219 lb 6.4 oz (99.5 kg)   SpO2 97%   BMI 35.41 kg/m    Subjective:    Patient ID: Thomas Huang, male    DOB: 1951-05-12, 65 y.o.   MRN: [id number]  HPI: Thomas Huang is a 65 y.o. male  Chief Complaint  Patient presents with  . Hernia    Patient thinks that he may have a hernia  Patient with egg size area in right lower groin area adjacent to the scrotum. Was seemingly bigger this morning. His been somewhat painful also.  Relevant past medical, surgical, family and social history reviewed and updated as indicated. Interim medical history since our last visit reviewed. Allergies and medications reviewed and updated.  Review of Systems  Constitutional: Negative.   Respiratory: Negative.   Cardiovascular: Negative.   Gastrointestinal: Negative.     Per HPI unless specifically indicated above     Objective:    BP 119/74 (BP Location: Left Arm, Patient Position: Sitting, Cuff Size: Large)   Pulse 86   Temp 98.2 F (36.8 C)   Wt 219 lb 6.4 oz (99.5 kg)   SpO2 97%   BMI 35.41 kg/m   Wt Readings from Last 3 Encounters:  10/01/16 219 lb 6.4 oz (99.5 kg)  09/10/16 219 lb (99.3 kg)  08/01/16 223 lb (101.2 kg)    Physical Exam  Constitutional: He is oriented to person, place, and time. He appears well-developed and well-nourished. No distress.  HENT:  Head: Normocephalic and atraumatic.  Right Ear: Hearing normal.  Left Ear: Hearing normal.  Nose: Nose normal.  Eyes: Conjunctivae and lids are normal. Right eye exhibits no discharge. Left eye exhibits no discharge. No scleral icterus.  Cardiovascular: Normal rate, regular rhythm and normal heart sounds.   Pulmonary/Chest: Effort normal and breath sounds normal. No respiratory distress.  Abdominal: Bowel sounds are normal.  Genitourinary:  Genitourinary Comments: Abscess and right skin area adjacent  to penis and just above scrotum. Area of firm and indurated no fluctuance  Musculoskeletal: Normal range of motion.  Neurological: He is alert and oriented to person, place, and time.  Skin: Skin is intact. No rash noted.  Psychiatric: He has a normal mood and affect. His speech is normal and behavior is normal. Judgment and thought content normal. Cognition and memory are normal.    Results for orders placed or performed in visit on 09/10/16  Bayer DCA Hb A1c Waived  Result Value Ref Range   Bayer DCA Hb A1c Waived 6.3 <7.0 %      Assessment & Plan:   Problem List Items Addressed This Visit      Other   Abscess of abdominal wall    Discuss abscess care and treatment as not ready for I&D yet. Discuss hot compresses and antibiotics may be able to avoid I&D. Discuss if gets worse and/or larger will need urology for I&D because of its location. Will help with referral if needed.          Follow up plan: Return if symptoms worsen or fail to improve, for As scheduled.

## 2016-10-01 NOTE — Assessment & Plan Note (Signed)
Discuss abscess care and treatment as not ready for I&D yet. Discuss hot compresses and antibiotics may be able to avoid I&D. Discuss if gets worse and/or larger will need urology for I&D because of its location. Will help with referral if needed.

## 2016-10-05 ENCOUNTER — Telehealth: Payer: Self-pay | Admitting: Family Medicine

## 2016-10-05 DIAGNOSIS — N200 Calculus of kidney: Secondary | ICD-10-CM

## 2016-10-05 DIAGNOSIS — N411 Chronic prostatitis: Secondary | ICD-10-CM

## 2016-10-05 NOTE — Telephone Encounter (Signed)
Routing to provider to enter in referral.

## 2016-10-05 NOTE — Telephone Encounter (Signed)
Pt called stated he needs a referral to Dr. Bernardo Heater at [facility] in [place], [place] Phone # [phone]. Thanks.

## 2016-10-11 ENCOUNTER — Encounter: Payer: Medicare HMO | Admitting: Family Medicine

## 2016-10-11 DIAGNOSIS — Z6836 Body mass index (BMI) 36.0-36.9, adult: Secondary | ICD-10-CM | POA: Diagnosis not present

## 2016-10-11 DIAGNOSIS — N492 Inflammatory disorders of scrotum: Secondary | ICD-10-CM | POA: Diagnosis not present

## 2016-10-11 DIAGNOSIS — N411 Chronic prostatitis: Secondary | ICD-10-CM | POA: Diagnosis not present

## 2016-12-12 ENCOUNTER — Ambulatory Visit: Payer: Medicare HMO | Admitting: Family Medicine

## 2016-12-12 ENCOUNTER — Ambulatory Visit: Payer: Self-pay | Admitting: Family Medicine

## 2016-12-25 ENCOUNTER — Encounter: Payer: Self-pay | Admitting: Family Medicine

## 2016-12-25 ENCOUNTER — Ambulatory Visit (INDEPENDENT_AMBULATORY_CARE_PROVIDER_SITE_OTHER): Payer: Medicare HMO | Admitting: Family Medicine

## 2016-12-25 VITALS — BP 126/79 | HR 80 | Temp 98.0°F | Ht 66.0 in | Wt 223.0 lb

## 2016-12-25 DIAGNOSIS — N183 Chronic kidney disease, stage 3 unspecified: Secondary | ICD-10-CM

## 2016-12-25 DIAGNOSIS — I129 Hypertensive chronic kidney disease with stage 1 through stage 4 chronic kidney disease, or unspecified chronic kidney disease: Secondary | ICD-10-CM | POA: Diagnosis not present

## 2016-12-25 DIAGNOSIS — E7800 Pure hypercholesterolemia, unspecified: Secondary | ICD-10-CM

## 2016-12-25 DIAGNOSIS — R3911 Hesitancy of micturition: Secondary | ICD-10-CM | POA: Diagnosis not present

## 2016-12-25 DIAGNOSIS — N401 Enlarged prostate with lower urinary tract symptoms: Secondary | ICD-10-CM

## 2016-12-25 DIAGNOSIS — E0821 Diabetes mellitus due to underlying condition with diabetic nephropathy: Secondary | ICD-10-CM | POA: Diagnosis not present

## 2016-12-25 DIAGNOSIS — N400 Enlarged prostate without lower urinary tract symptoms: Secondary | ICD-10-CM | POA: Insufficient documentation

## 2016-12-25 LAB — LP+ALT+AST PICCOLO, WAIVED
ALT (SGPT) PICCOLO, WAIVED: 24 U/L (ref 10–47)
AST (SGOT) Piccolo, Waived: 27 U/L (ref 11–38)
Chol/HDL Ratio Piccolo,Waive: 4.3 mg/dL
Cholesterol Piccolo, Waived: 132 mg/dL (ref <200)
HDL Chol Piccolo, Waived: 31 mg/dL — ABNORMAL LOW (ref >59)
LDL Chol Calc Piccolo Waived: 62 mg/dL (ref <100)
TRIGLYCERIDES PICCOLO,WAIVED: 197 mg/dL — AB (ref <150)
VLDL CHOL CALC PICCOLO,WAIVE: 39 mg/dL — AB (ref <30)

## 2016-12-25 LAB — BAYER DCA HB A1C WAIVED: HB A1C: 6.8 % (ref <7.0)

## 2016-12-25 MED ORDER — TAMSULOSIN HCL 0.4 MG PO CAPS: 0.4000 mg | ORAL_CAPSULE | Freq: Every day | ORAL | 2 refills | Status: DC

## 2016-12-25 MED ORDER — FINASTERIDE 5 MG PO TABS: 5.0000 mg | ORAL_TABLET | Freq: Every day | ORAL | 2 refills | Status: DC

## 2016-12-25 NOTE — Progress Notes (Signed)
BP 126/79   Pulse 80   Temp 98 F (36.7 C) (Oral)   Ht 5\' 6"  (1.676 m)   Wt 223 lb (101.2 kg)   SpO2 95%   BMI 35.99 kg/m    Subjective:    Patient ID: Thomas Huang, male    DOB: [birth year], 67 y.o.   MRN: [id number]  HPI: Thomas Huang is a 66 y.o. male  Chief Complaint  Patient presents with  . Follow-up  . Diabetes  . Hypertension   Patient all in all doing well no real issues with low blood sugar spells takes diabetes medicines without problems has gained some weight during the holiday season but back on diet and doing better. Blood pressure doing well with no complaints from medications takes faithfully. Same with Lipitor for cholesterol. Patient also concerned about recurrence of BPH symptoms was on Avodart and tamsulosin previously did well was able to stop medications for years but is redeveloped symptoms and wants to have some help.  Relevant past medical, surgical, family and social history reviewed and updated as indicated. Interim medical history since our last visit reviewed. Allergies and medications reviewed and updated.  Review of Systems  Constitutional: Negative.   Respiratory: Negative.   Cardiovascular: Negative.     Per HPI unless specifically indicated above     Objective:    BP 126/79   Pulse 80   Temp 98 F (36.7 C) (Oral)   Ht 5\' 6"  (1.676 m)   Wt 223 lb (101.2 kg)   SpO2 95%   BMI 35.99 kg/m   Wt Readings from Last 3 Encounters:  12/25/16 223 lb (101.2 kg)  10/01/16 219 lb 6.4 oz (99.5 kg)  09/10/16 219 lb (99.3 kg)    Physical Exam  Constitutional: He is oriented to person, place, and time. He appears well-developed and well-nourished. No distress.  HENT:  Head: Normocephalic and atraumatic.  Right Ear: Hearing normal.  Left Ear: Hearing normal.  Nose: Nose normal.  Eyes: Conjunctivae and lids are normal. Right eye exhibits no discharge. Left eye exhibits no discharge. No scleral icterus.  Cardiovascular: Normal rate,  regular rhythm and normal heart sounds.   Pulmonary/Chest: Effort normal and breath sounds normal. No respiratory distress.  Musculoskeletal: Normal range of motion.  Neurological: He is alert and oriented to person, place, and time.  Skin: Skin is intact. No rash noted.  Psychiatric: He has a normal mood and affect. His speech is normal and behavior is normal. Judgment and thought content normal. Cognition and memory are normal.    Results for orders placed or performed in visit on 09/10/16  Bayer DCA Hb A1c Waived  Result Value Ref Range   Bayer DCA Hb A1c Waived 6.3 <7.0 %      Assessment & Plan:   Problem List Items Addressed This Visit      Endocrine   Diabetes mellitus with renal manifestation ([place]) - Primary   Relevant Orders   Bayer DCA Hb A1c Waived (STAT)   Basic Metabolic Panel (BMET)   LP+ALT+AST Piccolo, Waived     Genitourinary   Hypertensive CKD (chronic kidney disease)    The current medical regimen is effective;  continue present plan and medications.       CKD (chronic kidney disease), stage III   Relevant Orders   Basic Metabolic Panel (BMET)   BPH (benign prostatic hyperplasia)    With recurrent symptoms of BPH will restart finasteride and Flomax patient education given. PSA also  done      Relevant Medications   finasteride (PROSCAR) 5 MG tablet   tamsulosin (FLOMAX) 0.4 MG CAPS capsule     Other   Hyperlipidemia    The current medical regimen is effective;  continue present plan and medications.       Relevant Orders   LP+ALT+AST Piccolo, Waived       Follow up plan: Return in about 3 months (around 03/25/2017) for Hemoglobin A1c.

## 2016-12-25 NOTE — Assessment & Plan Note (Signed)
The current medical regimen is effective;  continue present plan and medications.  

## 2016-12-25 NOTE — Assessment & Plan Note (Addendum)
With recurrent symptoms of BPH will restart finasteride and Flomax patient education given. PSA also done

## 2016-12-26 ENCOUNTER — Encounter: Payer: Self-pay | Admitting: Family Medicine

## 2016-12-26 LAB — BASIC METABOLIC PANEL
BUN / CREAT RATIO: 12 (ref 10–24)
BUN: 23 mg/dL (ref 8–27)
CALCIUM: 9.3 mg/dL (ref 8.6–10.2)
CO2: 21 mmol/L (ref 18–29)
Chloride: 100 mmol/L (ref 96–106)
Creatinine, Ser: 1.89 mg/dL — ABNORMAL HIGH (ref 0.76–1.27)
GFR calc non Af Amer: 37 mL/min/{1.73_m2} — ABNORMAL LOW (ref >59)
GFR, EST AFRICAN AMERICAN: 42 mL/min/{1.73_m2} — AB (ref >59)
Glucose: 165 mg/dL — ABNORMAL HIGH (ref 65–99)
Potassium: 4.8 mmol/L (ref 3.5–5.2)
Sodium: 140 mmol/L (ref 134–144)

## 2016-12-26 LAB — PSA: Prostate Specific Ag, Serum: 1.6 ng/mL (ref 0.0–4.0)

## 2017-02-20 DIAGNOSIS — K7689 Other specified diseases of liver: Secondary | ICD-10-CM | POA: Diagnosis not present

## 2017-02-20 DIAGNOSIS — K863 Pseudocyst of pancreas: Secondary | ICD-10-CM | POA: Diagnosis not present

## 2017-02-20 DIAGNOSIS — K862 Cyst of pancreas: Secondary | ICD-10-CM | POA: Diagnosis not present

## 2017-03-25 ENCOUNTER — Ambulatory Visit (INDEPENDENT_AMBULATORY_CARE_PROVIDER_SITE_OTHER): Payer: Medicare HMO | Admitting: Family Medicine

## 2017-03-25 ENCOUNTER — Encounter: Payer: Self-pay | Admitting: Family Medicine

## 2017-03-25 VITALS — BP 111/70 | HR 73 | Temp 98.5°F | Wt 228.2 lb

## 2017-03-25 DIAGNOSIS — I129 Hypertensive chronic kidney disease with stage 1 through stage 4 chronic kidney disease, or unspecified chronic kidney disease: Secondary | ICD-10-CM

## 2017-03-25 DIAGNOSIS — E7800 Pure hypercholesterolemia, unspecified: Secondary | ICD-10-CM | POA: Diagnosis not present

## 2017-03-25 DIAGNOSIS — N183 Chronic kidney disease, stage 3 unspecified: Secondary | ICD-10-CM

## 2017-03-25 DIAGNOSIS — E0821 Diabetes mellitus due to underlying condition with diabetic nephropathy: Secondary | ICD-10-CM | POA: Diagnosis not present

## 2017-03-25 LAB — BAYER DCA HB A1C WAIVED: HB A1C (BAYER DCA - WAIVED): 7.4 % — ABNORMAL HIGH (ref <7.0)

## 2017-03-25 NOTE — Progress Notes (Signed)
BP 111/70 (BP Location: Left Arm, Patient Position: Sitting, Cuff Size: Large)   Pulse 73   Temp 98.5 F (36.9 C)   Wt 228 lb 3.2 oz (103.5 kg)   SpO2 95%   BMI 36.83 kg/m    Subjective:    Patient ID: Thomas Huang, male    DOB: 05-17-1951, 66 y.o.   MRN: [id number]  HPI: Thomas Huang is a 66 y.o. male  Chief Complaint  Patient presents with  . Diabetes   Patient follow-up doing well noted low blood sugar spells has had a sedentary winter with the weather and gained weight. No complaints from diabetes. Blood pressure cholesterol doing well no complaints Through misunderstanding tried stopping Flomax for a little while found out what and ready to stop it in his back on Flomax. BPH symptoms controlled on medication now. Relevant past medical, surgical, family and social history reviewed and updated as indicated. Interim medical history since our last visit reviewed. Allergies and medications reviewed and updated.  Review of Systems  Constitutional: Negative.   Respiratory: Negative.   Cardiovascular: Negative.     Per HPI unless specifically indicated above     Objective:    BP 111/70 (BP Location: Left Arm, Patient Position: Sitting, Cuff Size: Large)   Pulse 73   Temp 98.5 F (36.9 C)   Wt 228 lb 3.2 oz (103.5 kg)   SpO2 95%   BMI 36.83 kg/m   Wt Readings from Last 3 Encounters:  03/25/17 228 lb 3.2 oz (103.5 kg)  12/25/16 223 lb (101.2 kg)  10/01/16 219 lb 6.4 oz (99.5 kg)    Physical Exam  Constitutional: He is oriented to person, place, and time. He appears well-developed and well-nourished.  HENT:  Head: Normocephalic and atraumatic.  Eyes: Conjunctivae and EOM are normal.  Neck: Normal range of motion.  Cardiovascular: Normal rate, regular rhythm and normal heart sounds.   Pulmonary/Chest: Effort normal and breath sounds normal.  Musculoskeletal: Normal range of motion.  Neurological: He is alert and oriented to person, place, and time.  Skin: No  erythema.  Psychiatric: He has a normal mood and affect. His behavior is normal. Judgment and thought content normal.    Results for orders placed or performed in visit on 12/25/16  Bayer DCA Hb A1c Waived (STAT)  Result Value Ref Range   Bayer DCA Hb A1c Waived 6.8 <5.6 %  Basic Metabolic Panel (BMET)  Result Value Ref Range   Glucose 165 (H) 65 - 99 mg/dL   BUN 23 8 - 27 mg/dL   Creatinine, Ser 1.89 (H) 0.76 - 1.27 mg/dL   GFR calc non Af Amer 37 (L) >59 mL/min/1.73   GFR calc Af Amer 42 (L) >59 mL/min/1.73   BUN/Creatinine Ratio 12 10 - 24   Sodium 140 134 - 144 mmol/L   Potassium 4.8 3.5 - 5.2 mmol/L   Chloride 100 96 - 106 mmol/L   CO2 21 18 - 29 mmol/L   Calcium 9.3 8.6 - 10.2 mg/dL  LP+ALT+AST Piccolo, Waived  Result Value Ref Range   ALT (SGPT) Piccolo, Waived 24 10 - 47 U/L   AST (SGOT) Piccolo, Waived 27 11 - 38 U/L   Cholesterol Piccolo, Waived 132 <200 mg/dL   HDL Chol Piccolo, Waived 31 (L) >59 mg/dL   Triglycerides Piccolo,Waived 197 (H) <150 mg/dL   Chol/HDL Ratio Piccolo,Waive 4.3 mg/dL   LDL Chol Calc Piccolo Waived 62 <100 mg/dL   VLDL Chol Calc Piccolo,Waive 39 (  H) <30 mg/dL  PSA  Result Value Ref Range   Prostate Specific Ag, Serum 1.6 0.0 - 4.0 ng/mL      Assessment & Plan:   Problem List Items Addressed This Visit      Endocrine   Diabetes mellitus with renal manifestation ([place]) - Primary    The current medical regimen is effective;  continue present plan and medications. Discuss better control with diet exercise nutrition will not adjust medications at this point.       Relevant Orders   Bayer DCA Hb A1c Waived     Genitourinary   Hypertensive CKD (chronic kidney disease)    The current medical regimen is effective;  continue present plan and medications.         Other   Hyperlipidemia   Relevant Orders   Bayer DCA Hb A1c Waived       Follow up plan: Return in about 3 months (around 06/24/2017) for Hemoglobin A1c,  Lipids, ALT,  AST.

## 2017-03-25 NOTE — Assessment & Plan Note (Addendum)
The current medical regimen is effective;  continue present plan and medications. Discuss better control with diet exercise nutrition will not adjust medications at this point.

## 2017-03-25 NOTE — Assessment & Plan Note (Signed)
The current medical regimen is effective;  continue present plan and medications.  

## 2017-04-23 ENCOUNTER — Other Ambulatory Visit: Payer: Self-pay | Admitting: Family Medicine

## 2017-04-23 NOTE — Telephone Encounter (Signed)
Last OV: 03/25/17 Next OV: 06/25/17  BMP Latest Ref Rng & Units 12/25/2016 07/09/2016 10/06/2015  Glucose 65 - 99 mg/dL 165(H) 109(H) 211(H)  BUN 8 - 27 mg/dL 23 27 35(H)  Creatinine 0.76 - 1.27 mg/dL 1.89(H) 2.12(H) 2.13(H)  BUN/Creat Ratio 10 - 24 12 13 16   Sodium 134 - 144 mmol/L 140 142 137  Potassium 3.5 - 5.2 mmol/L 4.8 4.7 4.7  Chloride 96 - 106 mmol/L 100 106 99  CO2 18 - 29 mmol/L 21 18 20   Calcium 8.6 - 10.2 mg/dL 9.3 9.2 9.4

## 2017-05-08 DIAGNOSIS — N183 Chronic kidney disease, stage 3 (moderate): Secondary | ICD-10-CM | POA: Diagnosis not present

## 2017-06-05 DIAGNOSIS — D631 Anemia in chronic kidney disease: Secondary | ICD-10-CM | POA: Diagnosis not present

## 2017-06-05 DIAGNOSIS — I129 Hypertensive chronic kidney disease with stage 1 through stage 4 chronic kidney disease, or unspecified chronic kidney disease: Secondary | ICD-10-CM | POA: Diagnosis not present

## 2017-06-05 DIAGNOSIS — N183 Chronic kidney disease, stage 3 (moderate): Secondary | ICD-10-CM | POA: Diagnosis not present

## 2017-06-05 DIAGNOSIS — E611 Iron deficiency: Secondary | ICD-10-CM | POA: Diagnosis not present

## 2017-06-05 DIAGNOSIS — Z6836 Body mass index (BMI) 36.0-36.9, adult: Secondary | ICD-10-CM | POA: Diagnosis not present

## 2017-06-05 DIAGNOSIS — E559 Vitamin D deficiency, unspecified: Secondary | ICD-10-CM | POA: Diagnosis not present

## 2017-06-07 ENCOUNTER — Telehealth: Payer: Self-pay | Admitting: Family Medicine

## 2017-06-07 NOTE — Telephone Encounter (Signed)
Called pt to schedule Annual Wellness Visit with NHA  - knb  °

## 2017-06-17 ENCOUNTER — Telehealth: Payer: Self-pay | Admitting: Family Medicine

## 2017-06-17 NOTE — Telephone Encounter (Signed)
Called pt to schedule Annual Wellness Visit with NHA  - knb  °

## 2017-06-25 ENCOUNTER — Encounter: Payer: Self-pay | Admitting: Family Medicine

## 2017-06-25 ENCOUNTER — Ambulatory Visit (INDEPENDENT_AMBULATORY_CARE_PROVIDER_SITE_OTHER): Payer: Medicare HMO | Admitting: Family Medicine

## 2017-06-25 DIAGNOSIS — E7800 Pure hypercholesterolemia, unspecified: Secondary | ICD-10-CM | POA: Diagnosis not present

## 2017-06-25 DIAGNOSIS — I129 Hypertensive chronic kidney disease with stage 1 through stage 4 chronic kidney disease, or unspecified chronic kidney disease: Secondary | ICD-10-CM | POA: Diagnosis not present

## 2017-06-25 DIAGNOSIS — E0821 Diabetes mellitus due to underlying condition with diabetic nephropathy: Secondary | ICD-10-CM | POA: Diagnosis not present

## 2017-06-25 DIAGNOSIS — N183 Chronic kidney disease, stage 3 (moderate): Secondary | ICD-10-CM | POA: Diagnosis not present

## 2017-06-25 DIAGNOSIS — M961 Postlaminectomy syndrome, not elsewhere classified: Secondary | ICD-10-CM | POA: Diagnosis not present

## 2017-06-25 MED ORDER — FEXOFENADINE-PSEUDOEPHED ER 60-120 MG PO TB12: 1.0000 | ORAL_TABLET | Freq: Two times a day (BID) | ORAL | 5 refills | Status: DC

## 2017-06-25 NOTE — Assessment & Plan Note (Signed)
Early disability Has another disk will need surgery

## 2017-06-25 NOTE — Progress Notes (Signed)
   BP 108/63   Pulse 81   Wt 219 lb (99.3 kg)   SpO2 98%   BMI 35.35 kg/m    Subjective:    Patient ID: Thomas Huang, male    DOB: 17-Apr-1951, 66 y.o.   MRN: [id number]  HPI: Thomas Huang is a 66 y.o. male  Chief Complaint  Patient presents with  . Follow-up  . Diabetes  . Colon Cancer Screening    Pt states he willl call and get colonoscopy scheduled.     Relevant past medical, surgical, family and social history reviewed and updated as indicated. Interim medical history since our last visit reviewed. Allergies and medications reviewed and updated.  Review of Systems  Constitutional: Negative.   Respiratory: Negative.   Cardiovascular: Negative.     Per HPI unless specifically indicated above     Objective:    BP 108/63   Pulse 81   Wt 219 lb (99.3 kg)   SpO2 98%   BMI 35.35 kg/m   Wt Readings from Last 3 Encounters:  06/25/17 219 lb (99.3 kg)  03/25/17 228 lb 3.2 oz (103.5 kg)  12/25/16 223 lb (101.2 kg)    Physical Exam  Constitutional: He is oriented to person, place, and time. He appears well-developed and well-nourished.  HENT:  Head: Normocephalic and atraumatic.  Eyes: Conjunctivae and EOM are normal.  Neck: Normal range of motion.  Cardiovascular: Normal rate, regular rhythm and normal heart sounds.   Pulmonary/Chest: Effort normal and breath sounds normal.  Musculoskeletal: Normal range of motion.  Neurological: He is alert and oriented to person, place, and time.  Skin: No erythema.  Psychiatric: He has a normal mood and affect. His behavior is normal. Judgment and thought content normal.    Results for orders placed or performed in visit on 03/25/17  Bayer DCA Hb A1c Waived  Result Value Ref Range   Bayer DCA Hb A1c Waived 7.4 (H) <7.0 %      Assessment & Plan:   Problem List Items Addressed This Visit      Endocrine   Diabetes mellitus with renal manifestation ([place])    The current medical regimen is effective;  continue present  plan and medications.       Relevant Orders   Bayer DCA Hb A1c Waived   Basic metabolic panel   LP+ALT+AST Piccolo, Waived     Genitourinary   Hypertensive CKD (chronic kidney disease)    The current medical regimen is effective;  continue present plan and medications.         Other   Hyperlipidemia   Relevant Orders   Bayer DCA Hb A1c Waived   Basic metabolic panel   LP+ALT+AST Piccolo, Waived   Failed back surgical syndrome    Early disability Has another disk will need surgery          Follow up plan: Return in about 3 months (around 09/25/2017) for Hemoglobin A1c.

## 2017-06-25 NOTE — Assessment & Plan Note (Signed)
The current medical regimen is effective;  continue present plan and medications.  

## 2017-06-26 ENCOUNTER — Encounter: Payer: Self-pay | Admitting: Family Medicine

## 2017-06-26 LAB — BASIC METABOLIC PANEL
BUN/Creatinine Ratio: 14 (ref 10–24)
BUN: 28 mg/dL — ABNORMAL HIGH (ref 8–27)
CO2: 22 mmol/L (ref 20–29)
CREATININE: 2.07 mg/dL — AB (ref 0.76–1.27)
Calcium: 8.9 mg/dL (ref 8.6–10.2)
Chloride: 105 mmol/L (ref 96–106)
GFR calc Af Amer: 38 mL/min/{1.73_m2} — ABNORMAL LOW (ref >59)
GFR calc non Af Amer: 33 mL/min/{1.73_m2} — ABNORMAL LOW (ref >59)
GLUCOSE: 97 mg/dL (ref 65–99)
Potassium: 4.7 mmol/L (ref 3.5–5.2)
SODIUM: 140 mmol/L (ref 134–144)

## 2017-07-01 LAB — LP+ALT+AST PICCOLO, WAIVED
ALT (SGPT) PICCOLO, WAIVED: 31 U/L (ref 10–47)
AST (SGOT) PICCOLO, WAIVED: 36 U/L (ref 11–38)
Chol/HDL Ratio Piccolo,Waive: 4.1 mg/dL
Cholesterol Piccolo, Waived: 129 mg/dL (ref <200)
HDL Chol Piccolo, Waived: 31 mg/dL — ABNORMAL LOW (ref >59)
LDL Chol Calc Piccolo Waived: 73 mg/dL (ref <100)
Triglycerides Piccolo,Waived: 125 mg/dL (ref <150)
VLDL Chol Calc Piccolo,Waive: 25 mg/dL (ref <30)

## 2017-07-01 LAB — BAYER DCA HB A1C WAIVED: HB A1C: 6.7 % (ref <7.0)

## 2017-07-02 ENCOUNTER — Telehealth: Payer: Self-pay | Admitting: Family Medicine

## 2017-07-02 NOTE — Telephone Encounter (Signed)
Called pt to schedule for Annual Wellness Visit with Nurse Health Advisor, Tiffany Hill, my c/b # is [phone]  Kathryn Brown ° °

## 2017-08-21 ENCOUNTER — Other Ambulatory Visit: Payer: Self-pay | Admitting: Family Medicine

## 2017-08-22 ENCOUNTER — Other Ambulatory Visit: Payer: Self-pay | Admitting: Family Medicine

## 2017-09-10 ENCOUNTER — Other Ambulatory Visit: Payer: Self-pay | Admitting: Family Medicine

## 2017-09-10 DIAGNOSIS — R3911 Hesitancy of micturition: Principal | ICD-10-CM

## 2017-09-10 DIAGNOSIS — N401 Enlarged prostate with lower urinary tract symptoms: Secondary | ICD-10-CM

## 2017-09-20 ENCOUNTER — Telehealth: Payer: Self-pay

## 2017-09-20 NOTE — Telephone Encounter (Signed)
Called to schedule medicare wellness visit with pt at [facility]. Patient has an appt with Dr.Crissman on 09/25/17 at 1030, offered an 11am appt with NHA on same day.   Direct call [phone]

## 2017-09-25 ENCOUNTER — Ambulatory Visit (INDEPENDENT_AMBULATORY_CARE_PROVIDER_SITE_OTHER): Payer: Medicare Other | Admitting: Family Medicine

## 2017-09-25 ENCOUNTER — Encounter: Payer: Self-pay | Admitting: Family Medicine

## 2017-09-25 VITALS — BP 137/70 | HR 69 | Wt 221.0 lb

## 2017-09-25 DIAGNOSIS — Z23 Encounter for immunization: Secondary | ICD-10-CM

## 2017-09-25 DIAGNOSIS — B079 Viral wart, unspecified: Secondary | ICD-10-CM

## 2017-09-25 DIAGNOSIS — E0821 Diabetes mellitus due to underlying condition with diabetic nephropathy: Secondary | ICD-10-CM | POA: Diagnosis not present

## 2017-09-25 DIAGNOSIS — L989 Disorder of the skin and subcutaneous tissue, unspecified: Secondary | ICD-10-CM | POA: Diagnosis not present

## 2017-09-25 DIAGNOSIS — E1129 Type 2 diabetes mellitus with other diabetic kidney complication: Secondary | ICD-10-CM

## 2017-09-25 NOTE — Assessment & Plan Note (Signed)
Cryo-distraction of wart on left cheek

## 2017-09-25 NOTE — Assessment & Plan Note (Signed)
The current medical regimen is effective;  continue present plan and medications.  

## 2017-09-25 NOTE — Progress Notes (Signed)
BP 137/70 (BP Location: Left Arm)   Pulse 69   Wt 221 lb (100.2 kg)   SpO2 95%   BMI 35.67 kg/m    Subjective:    Patient ID: Thomas Huang, male    DOB: 10-17-1951, 66 y.o.   MRN: [id number]  HPI: Thomas Huang is a 66 y.o. male  Chief Complaint  Patient presents with  . Diabetes  . Follow-up  Patient follow-up all in all doing well no complaints from medication. Taking faithfully no low blood sugar spells blood pressure good control no issues with cholesterol either. Patient also has warty growth left side of his face adjacent to his nose. This gets irritated by CPAP mask and comes bothersome has occasionally scratched and bled.  Relevant past medical, surgical, family and social history reviewed and updated as indicated. Interim medical history since our last visit reviewed. Allergies and medications reviewed and updated.  Review of Systems  Constitutional: Negative.   Respiratory: Negative.   Cardiovascular: Negative.     Per HPI unless specifically indicated above     Objective:    BP 137/70 (BP Location: Left Arm)   Pulse 69   Wt 221 lb (100.2 kg)   SpO2 95%   BMI 35.67 kg/m   Wt Readings from Last 3 Encounters:  09/25/17 221 lb (100.2 kg)  06/25/17 219 lb (99.3 kg)  03/25/17 228 lb 3.2 oz (103.5 kg)    Physical Exam  Constitutional: He is oriented to person, place, and time. He appears well-developed and well-nourished.  HENT:  Head: Normocephalic and atraumatic.  Eyes: Conjunctivae and EOM are normal.  Neck: Normal range of motion.  Cardiovascular: Normal rate, regular rhythm and normal heart sounds.   Pulmonary/Chest: Effort normal and breath sounds normal.  Musculoskeletal: Normal range of motion.  Neurological: He is alert and oriented to person, place, and time.  Skin: No erythema.  Growth destroyed with cryo-unit with 2 freeze cycles  Psychiatric: He has a normal mood and affect. His behavior is normal. Judgment and thought content normal.     Results for orders placed or performed in visit on 06/25/17  Bayer DCA Hb A1c Waived  Result Value Ref Range   Bayer DCA Hb A1c Waived 6.7 <2.4 %  Basic metabolic panel  Result Value Ref Range   Glucose 97 65 - 99 mg/dL   BUN 28 (H) 8 - 27 mg/dL   Creatinine, Ser 2.07 (H) 0.76 - 1.27 mg/dL   GFR calc non Af Amer 33 (L) >59 mL/min/1.73   GFR calc Af Amer 38 (L) >59 mL/min/1.73   BUN/Creatinine Ratio 14 10 - 24   Sodium 140 134 - 144 mmol/L   Potassium 4.7 3.5 - 5.2 mmol/L   Chloride 105 96 - 106 mmol/L   CO2 22 20 - 29 mmol/L   Calcium 8.9 8.6 - 10.2 mg/dL  LP+ALT+AST Piccolo, Waived  Result Value Ref Range   ALT (SGPT) Piccolo, Waived 31 10 - 47 U/L   AST (SGOT) Piccolo, Waived 36 11 - 38 U/L   Cholesterol Piccolo, Waived 129 <200 mg/dL   HDL Chol Piccolo, Waived 31 (L) >59 mg/dL   Triglycerides Piccolo,Waived 125 <150 mg/dL   Chol/HDL Ratio Piccolo,Waive 4.1 mg/dL   LDL Chol Calc Piccolo Waived 73 <100 mg/dL   VLDL Chol Calc Piccolo,Waive 25 <30 mg/dL      Assessment & Plan:   Problem List Items Addressed This Visit      Endocrine   Diabetes  mellitus with renal manifestation ([facility])    The current medical regimen is effective;  continue present plan and medications.       Relevant Orders   Bayer DCA Hb A1c Waived     Musculoskeletal and Integument   Skin lesion of cheek    Cryo-distraction of wart on left cheek       Other Visit Diagnoses    Needs flu shot    -  Primary   Relevant Orders   Flu vaccine HIGH DOSE PF (Fluzone High dose) (Completed)       Follow up plan: Return in about 3 months (around 12/26/2017) for Physical Exam, Hemoglobin A1c.

## 2017-09-26 LAB — BAYER DCA HB A1C WAIVED: HB A1C (BAYER DCA - WAIVED): 6.6 % (ref <7.0)

## 2017-10-19 ENCOUNTER — Other Ambulatory Visit: Payer: Self-pay | Admitting: Family Medicine

## 2017-10-23 ENCOUNTER — Other Ambulatory Visit: Payer: Self-pay

## 2017-10-23 ENCOUNTER — Emergency Department
Admission: EM | Admit: 2017-10-23 | Discharge: 2017-10-23 | Disposition: A | Discharge status: Home / Self Care | Payer: Medicare Other | Attending: Emergency Medicine | Admitting: Emergency Medicine

## 2017-10-23 ENCOUNTER — Emergency Department: Payer: Medicare Other

## 2017-10-23 ENCOUNTER — Encounter: Payer: Self-pay | Admitting: Emergency Medicine

## 2017-10-23 DIAGNOSIS — E1122 Type 2 diabetes mellitus with diabetic chronic kidney disease: Secondary | ICD-10-CM | POA: Diagnosis not present

## 2017-10-23 DIAGNOSIS — S022XXB Fracture of nasal bones, initial encounter for open fracture: Secondary | ICD-10-CM | POA: Insufficient documentation

## 2017-10-23 DIAGNOSIS — S0993XA Unspecified injury of face, initial encounter: Secondary | ICD-10-CM | POA: Diagnosis present

## 2017-10-23 DIAGNOSIS — Y92009 Unspecified place in unspecified non-institutional (private) residence as the place of occurrence of the external cause: Secondary | ICD-10-CM | POA: Diagnosis not present

## 2017-10-23 DIAGNOSIS — Y93E9 Activity, other interior property and clothing maintenance: Secondary | ICD-10-CM | POA: Insufficient documentation

## 2017-10-23 DIAGNOSIS — S0992XA Unspecified injury of nose, initial encounter: Secondary | ICD-10-CM | POA: Diagnosis not present

## 2017-10-23 DIAGNOSIS — W010XXA Fall on same level from slipping, tripping and stumbling without subsequent striking against object, initial encounter: Secondary | ICD-10-CM | POA: Insufficient documentation

## 2017-10-23 DIAGNOSIS — N183 Chronic kidney disease, stage 3 (moderate): Secondary | ICD-10-CM | POA: Insufficient documentation

## 2017-10-23 DIAGNOSIS — S022XXA Fracture of nasal bones, initial encounter for closed fracture: Secondary | ICD-10-CM | POA: Insufficient documentation

## 2017-10-23 DIAGNOSIS — Z79899 Other long term (current) drug therapy: Secondary | ICD-10-CM | POA: Diagnosis not present

## 2017-10-23 DIAGNOSIS — J3489 Other specified disorders of nose and nasal sinuses: Secondary | ICD-10-CM | POA: Diagnosis not present

## 2017-10-23 DIAGNOSIS — Y999 Unspecified external cause status: Secondary | ICD-10-CM | POA: Diagnosis not present

## 2017-10-23 MED ORDER — AMOXICILLIN-POT CLAVULANATE 875-125 MG PO TABS: 1.0000 | ORAL_TABLET | Freq: Two times a day (BID) | ORAL | 0 refills | Status: AC

## 2017-10-23 MED ORDER — OXYCODONE-ACETAMINOPHEN 5-325 MG PO TABS
1.0000 | ORAL_TABLET | Freq: Four times a day (QID) | ORAL | 0 refills | Status: AC | PRN
Start: 2017-10-23 — End: 2017-10-28

## 2017-10-23 MED ORDER — OXYCODONE-ACETAMINOPHEN 5-325 MG PO TABS
1.0000 | ORAL_TABLET | Freq: Once | ORAL | Status: AC
  Administered 2017-10-23: 1 via ORAL
  Filled 2017-10-23: qty 1

## 2017-10-23 MED ORDER — OXYMETAZOLINE HCL 0.05 % NA SOLN
1.0000 | Freq: Once | NASAL | Status: AC
  Administered 2017-10-23: 1 via NASAL
  Filled 2017-10-23: qty 15

## 2017-10-23 NOTE — ED Triage Notes (Signed)
Pt ambulatory to triage without difficulty; bleeding noted from right nare; reports PTA, was outside closing house vents, tripped and fell forward hitting nose on flashlight; pt denies any other c/o or injuries; nasal clamp applied

## 2017-10-23 NOTE — ED Provider Notes (Signed)
[facility] Emergency Department Provider Note  ____________________________________________  Time seen: Approximately 10:09 PM  I have reviewed the triage vital signs and the nursing notes.   HISTORY  Chief Complaint Facial Injury    HPI Thomas Huang is a 66 y.o. male presents to the emergency department with bilateral epistaxis and 10 out of 10 nasal bone pain after patient tripped and fell forward while closing house vents.  Patient did not lose consciousness.  He denies any blurry vision, nausea, vomiting or disorientation.  No confusion.  Patient denies neck pain.  Patient reports that he can "hear sounds" when he moves his nose.   Past Medical History:  Diagnosis Date  . Chronic kidney disease   . Hyperlipidemia   . Sleep apnea     Patient Active Problem List   Diagnosis Date Noted  . Skin lesion of cheek 09/25/2017  . BPH (benign prostatic hyperplasia) 12/25/2016  . Abscess of abdominal wall 10/01/2016  . Aortic heart murmur 07/09/2016  . OSA on CPAP 07/19/2015  . Hyperlipidemia 06/07/2015  . Kidney stones 06/07/2015  . 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 Allergic rhinitis 06/07/2015  . Dermatophytosis 06/07/2015  . Vitamin D deficiency 06/07/2015  . Diabetes mellitus with renal manifestation ([place]) 06/07/2015  . Hypertensive CKD (chronic kidney disease) 06/07/2015  . CKD (chronic kidney disease), stage III ([place]) 06/07/2015  . Chronic prostatitis 06/07/2015  . GERD (gastroesophageal reflux disease) 06/07/2015  . ED (erectile dysfunction) 06/07/2015    Past Surgical History:  Procedure Laterality Date  . CARPAL TUNNEL RELEASE    . LAMINECTOMY    . recurrent kidney stones      Prior to Admission medications   Medication Sig Start Date End Date Taking? Authorizing Provider  amoxicillin-clavulanate (AUGMENTIN) 875-125 MG tablet Take 1 tablet by mouth 2 (two) times daily for 10 days. 10/23/17 11/02/17  Vallarie Mare M, PA-C   atorvastatin (LIPITOR) 10 MG tablet TAKE 1 TABLET BY MOUTH EVERY DAY 08/22/17   Guadalupe Maple, MD  Azelastine-Fluticasone 137-50 MCG/ACT SUSP Place 2 puffs into the nose 2 (two) times daily. 02/02/16   Guadalupe Maple, MD  fexofenadine-pseudoephedrine (ALLEGRA-D) 60-120 MG 12 hr tablet Take 1 tablet by mouth 2 (two) times daily. 06/25/17   Guadalupe Maple, MD  finasteride (PROSCAR) 5 MG tablet TAKE 1 TABLET (5 MG TOTAL) BY MOUTH DAILY. 09/10/17   Volney American, PA-C  glimepiride (AMARYL) 2 MG tablet TAKE 1 TABLET BY MOUTH EVERY MORNING WITH FOOD 08/21/17   Guadalupe Maple, MD  glucose blood test strip 1 each by Other route as needed for other. Use as instructed    [provider]  lisinopril (PRINIVIL,ZESTRIL) 10 MG tablet TAKE 1 TABLET BY MOUTH EVERY DAY 10/21/17   Guadalupe Maple, MD  omeprazole (PRILOSEC) 20 MG capsule Take 1 capsule (20 mg total) by mouth daily. 07/09/16   Guadalupe Maple, MD  oxyCODONE-acetaminophen (ROXICET) 5-325 MG tablet Take 1 tablet by mouth every 6 (six) hours as needed for up to 5 days for severe pain. 10/23/17 10/28/17  Lannie Fields, PA-C  sildenafil (REVATIO) 20 MG tablet Take 1 tablet (20 mg total) by mouth once as needed. Take 1-5 prn ED 10/06/15   Guadalupe Maple, MD  tamsulosin (FLOMAX) 0.4 MG CAPS capsule TAKE 1 CAPSULE (0.4 MG TOTAL) BY MOUTH DAILY. 09/10/17   Volney American, PA-C    Allergies Nsaids; Pollen extract; and Shellfish allergy  Family History  Problem Relation Age of Onset  . Diabetes Mother   . Heart disease Mother   . Cancer Mother        breast and lung  . Stroke Mother   . Heart attack Mother   . Cancer Father        pancreatic  . Hypertension Brother   . Hyperlipidemia Brother   . Diabetes Brother   . Diabetes Maternal Grandmother   . Diabetes Paternal Grandmother     Social History Social History   Tobacco Use  . Smoking status: Never Smoker  . Smokeless tobacco: Never Used  Substance Use  Topics  . Alcohol use: No    Alcohol/week: 0.0 oz  . Drug use: No     Review of Systems  Constitutional: No fever/chills Eyes: No visual changes. No discharge ENT: Patient has nasal pain. Patient has epistaxis.  Cardiovascular: no chest pain. Respiratory: no cough. No SOB. Gastrointestinal: No abdominal pain.  No nausea, no vomiting.  No diarrhea.  No constipation. Musculoskeletal: Negative for musculoskeletal pain. Skin: Negative for rash, abrasions, lacerations, ecchymosis. Neurological: Negative for headaches, focal weakness or numbness.   ____________________________________________   PHYSICAL EXAM:  VITAL SIGNS: ED Triage Vitals  Enc Vitals Group     BP 10/23/17 2030 (!) 151/73     Pulse Rate 10/23/17 2030 95     Resp 10/23/17 2030 16     Temp 10/23/17 2030 98.6 F (37 C)     Temp Source 10/23/17 2030 Oral     SpO2 10/23/17 2030 99 %     Weight 10/23/17 2029 225 lb (102.1 kg)     Height 10/23/17 2029 5\' 6"  (1.676 m)     Head Circumference --      Peak Flow --      Pain Score 10/23/17 2027 8     Pain Loc --      Pain Edu? --      Excl. in [place]? --      Constitutional: Alert and oriented. Well appearing and in no acute distress. Eyes: Conjunctivae are normal. PERRL. EOMI. Head: Atraumatic. ENT:      Ears: TMs are occluded by wax bilaterally.     Nose: Patient has trace blood at the nares bilaterally.  Nasal septum deviates to the right.  Edema of the nose visualized bilaterally.      Mouth/Throat: Mucous membranes are moist.  Neck: FROM.  Hematological/Lymphatic/Immunilogical: No cervical lymphadenopathy.  Cardiovascular: Normal rate, regular rhythm. Normal S1 and S2.  Good peripheral circulation. Respiratory: Normal respiratory effort without tachypnea or retractions. Lungs CTAB. Good air entry to the bases with no decreased or absent breath sounds. Musculoskeletal: Full range of motion to all extremities. No gross deformities appreciated. Neurologic:  Normal  speech and language. No gross focal neurologic deficits are appreciated.  Skin:  Skin is warm, dry and intact. No rash noted. Psychiatric: Mood and affect are normal. Speech and behavior are normal. Patient exhibits appropriate insight and judgement.   ____________________________________________   LABS (all labs ordered are listed, but only abnormal results are displayed)  Labs Reviewed - No data to display ____________________________________________  EKG   ____________________________________________  RADIOLOGY Unk Pinto, personally viewed and evaluated these images (plain radiographs) as part of my medical decision making, as well as reviewing the written report by the radiologist.  Dg Nasal Bones  Result Date: 10/23/2017 CLINICAL DATA:  Nasal injury EXAM: NASAL BONES - 3+ VIEW COMPARISON:  None. FINDINGS: Tiny slightly displaced fracture  at the tip of the right nasal bones. Metallic screw in the maxillary bone anteriorly. No fluid levels in the visible sinuses. IMPRESSION: Tiny slightly displaced fracture at the tip of the right nasal bones Electronically Signed   By: Donavan Foil M.D.   On: 10/23/2017 20:48   Ct Maxillofacial Wo Contrast  Result Date: 10/23/2017 CLINICAL DATA:  Fall with right nose injury.  Initial encounter. EXAM: CT MAXILLOFACIAL WITHOUT CONTRAST TECHNIQUE: Multidetector CT imaging of the maxillofacial structures was performed. Multiplanar CT image reconstructions were also generated. COMPARISON:  None. FINDINGS: Osseous: Comminuted bilateral nasal arch. There is medial displacement at the level of the anterior process left maxilla. The nasal septum is fractured at the bony cartilaginous junction with septal soft tissue gas. Septum is deviated to the right on a chronic basis based on rightward spurring. No orbital or ethmoid continuation. Orbits: No evidence of globe injury or postseptal hematoma. Sinuses: No acute finding. Soft tissues: Soft tissue  swelling over the nasal bridge. Punctate density along the skin surface of the left nasal arch the could be calcification or debris. Material in the bilateral nasal cavity that is likely hemorrhage. Prominent arterial calcifications. Limited intracranial: No evidence of injury. IMPRESSION: 1. Comminuted bilateral nasal arch fractures with mild depression on the left. 2. Nasal septum fracture. The septum is deviated and spurred towards the right. Electronically Signed   By: Monte Fantasia M.D.   On: 10/23/2017 22:15    ____________________________________________    PROCEDURES  Procedure(s) performed:    Procedures    Medications  oxymetazoline (AFRIN) 0.05 % nasal spray 1 spray (1 spray Each Nare Given 10/23/17 2212)  oxyCODONE-acetaminophen (PERCOCET/ROXICET) 5-325 MG per tablet 1 tablet (1 tablet Oral Given 10/23/17 2211)     ____________________________________________   INITIAL IMPRESSION / ASSESSMENT AND PLAN / ED COURSE  Pertinent labs & imaging results that were available during my care of the patient were reviewed by me and considered in my medical decision making (see chart for details).  Review of the [place] CSRS was performed in accordance of the [place] prior to dispensing any controlled drugs.     Assessment and Plan: Nasal bone fractures Nasal septum fracture Patient presents to the emergency department with nasal bone pain after falling against house vents.  CT maxillofacial reveals a fractured nasal septum along with comminuted bilateral nasal arch fractures.  Epistaxis was resolved in the emergency department with Afrin and patient was discharged with Roxicet and Augmentin.  Patient was referred to otolaryngology, Dr. Pryor Ochoa.  Patient was advised to make an appointment as soon as possible.  Vital signs were reassuring prior to discharge.  All patient questions were answered.    ____________________________________________  FINAL CLINICAL IMPRESSION(S) / ED  DIAGNOSES  Final diagnoses:  Open fracture of nasal septum, initial encounter  Closed fracture of nasal bone, initial encounter      NEW MEDICATIONS STARTED DURING THIS VISIT:  ED Discharge Orders        Ordered    amoxicillin-clavulanate (AUGMENTIN) 875-125 MG tablet  2 times daily     10/23/17 2238    oxyCODONE-acetaminophen (ROXICET) 5-325 MG tablet  Every 6 hours PRN     10/23/17 2238          This chart was dictated using voice recognition software/Dragon. Despite best efforts to proofread, errors can occur which can change the meaning. Any change was purely unintentional.    Lannie Fields, PA-C 10/23/17 2312    Nance Pear, MD 10/26/17 [phone]

## 2017-10-28 DIAGNOSIS — J343 Hypertrophy of nasal turbinates: Secondary | ICD-10-CM | POA: Diagnosis not present

## 2017-10-28 DIAGNOSIS — J342 Deviated nasal septum: Secondary | ICD-10-CM | POA: Diagnosis not present

## 2017-10-28 DIAGNOSIS — S022XXA Fracture of nasal bones, initial encounter for closed fracture: Secondary | ICD-10-CM | POA: Diagnosis not present

## 2017-10-29 ENCOUNTER — Encounter: Payer: Self-pay | Admitting: *Deleted

## 2017-10-29 ENCOUNTER — Other Ambulatory Visit: Payer: Self-pay

## 2017-11-05 ENCOUNTER — Ambulatory Visit
Admission: RE | Admit: 2017-11-05 | Discharge: 2017-11-05 | Disposition: A | Discharge status: Home / Self Care | Payer: Medicare Other | Source: Ambulatory Visit | Attending: Otolaryngology | Admitting: Otolaryngology

## 2017-11-05 ENCOUNTER — Encounter
Admission: RE | Disposition: A | Discharge status: Home / Self Care | Payer: Self-pay | Source: Ambulatory Visit | Attending: Otolaryngology

## 2017-11-05 ENCOUNTER — Ambulatory Visit: Payer: Medicare Other | Admitting: Anesthesiology

## 2017-11-05 DIAGNOSIS — E1122 Type 2 diabetes mellitus with diabetic chronic kidney disease: Secondary | ICD-10-CM | POA: Insufficient documentation

## 2017-11-05 DIAGNOSIS — N189 Chronic kidney disease, unspecified: Secondary | ICD-10-CM | POA: Diagnosis not present

## 2017-11-05 DIAGNOSIS — J343 Hypertrophy of nasal turbinates: Secondary | ICD-10-CM | POA: Diagnosis not present

## 2017-11-05 DIAGNOSIS — E785 Hyperlipidemia, unspecified: Secondary | ICD-10-CM | POA: Diagnosis not present

## 2017-11-05 DIAGNOSIS — K219 Gastro-esophageal reflux disease without esophagitis: Secondary | ICD-10-CM | POA: Diagnosis not present

## 2017-11-05 DIAGNOSIS — G4730 Sleep apnea, unspecified: Secondary | ICD-10-CM | POA: Diagnosis not present

## 2017-11-05 DIAGNOSIS — Z8249 Family history of ischemic heart disease and other diseases of the circulatory system: Secondary | ICD-10-CM | POA: Diagnosis not present

## 2017-11-05 DIAGNOSIS — Z823 Family history of stroke: Secondary | ICD-10-CM | POA: Insufficient documentation

## 2017-11-05 DIAGNOSIS — Z79899 Other long term (current) drug therapy: Secondary | ICD-10-CM | POA: Insufficient documentation

## 2017-11-05 DIAGNOSIS — M1990 Unspecified osteoarthritis, unspecified site: Secondary | ICD-10-CM | POA: Diagnosis not present

## 2017-11-05 DIAGNOSIS — Z886 Allergy status to analgesic agent status: Secondary | ICD-10-CM | POA: Insufficient documentation

## 2017-11-05 DIAGNOSIS — J3489 Other specified disorders of nose and nasal sinuses: Secondary | ICD-10-CM | POA: Insufficient documentation

## 2017-11-05 DIAGNOSIS — J342 Deviated nasal septum: Secondary | ICD-10-CM | POA: Diagnosis not present

## 2017-11-05 DIAGNOSIS — W19XXXA Unspecified fall, initial encounter: Secondary | ICD-10-CM | POA: Insufficient documentation

## 2017-11-05 DIAGNOSIS — Z8349 Family history of other endocrine, nutritional and metabolic diseases: Secondary | ICD-10-CM | POA: Insufficient documentation

## 2017-11-05 DIAGNOSIS — Z833 Family history of diabetes mellitus: Secondary | ICD-10-CM | POA: Insufficient documentation

## 2017-11-05 DIAGNOSIS — S022XXA Fracture of nasal bones, initial encounter for closed fracture: Secondary | ICD-10-CM | POA: Insufficient documentation

## 2017-11-05 DIAGNOSIS — I129 Hypertensive chronic kidney disease with stage 1 through stage 4 chronic kidney disease, or unspecified chronic kidney disease: Secondary | ICD-10-CM | POA: Diagnosis not present

## 2017-11-05 HISTORY — DX: Carpal tunnel syndrome, left upper limb: G56.02

## 2017-11-05 HISTORY — PX: CLOSED REDUCTION NASAL FRACTURE: SHX5365

## 2017-11-05 HISTORY — DX: Cardiac murmur, unspecified: R01.1

## 2017-11-05 HISTORY — DX: Type 2 diabetes mellitus without complications: E11.9

## 2017-11-05 HISTORY — DX: Gastro-esophageal reflux disease without esophagitis: K21.9

## 2017-11-05 HISTORY — DX: Unspecified osteoarthritis, unspecified site: M19.90

## 2017-11-05 HISTORY — DX: Dizziness and giddiness: R42

## 2017-11-05 LAB — GLUCOSE, CAPILLARY
GLUCOSE-CAPILLARY: 132 mg/dL — AB (ref 65–99)
Glucose-Capillary: 152 mg/dL — ABNORMAL HIGH (ref 65–99)

## 2017-11-05 SURGERY — CLOSED REDUCTION, FRACTURE, NASAL BONE
Anesthesia: General | Site: Nose | Wound class: Clean Contaminated

## 2017-11-05 MED ORDER — ONDANSETRON HCL 4 MG/2ML IJ SOLN
INTRAMUSCULAR | Status: DC | PRN
  Administered 2017-11-05: 4 mg via INTRAVENOUS

## 2017-11-05 MED ORDER — ACETAMINOPHEN 10 MG/ML IV SOLN
1000.0000 mg | Freq: Once | INTRAVENOUS | Status: AC
  Administered 2017-11-05: 1000 mg via INTRAVENOUS

## 2017-11-05 MED ORDER — HYDROCODONE-ACETAMINOPHEN 5-325 MG PO TABS: 1.0000 | ORAL_TABLET | Freq: Four times a day (QID) | ORAL | 0 refills | Status: DC | PRN

## 2017-11-05 MED ORDER — LACTATED RINGERS IV SOLN: INTRAVENOUS | Status: DC

## 2017-11-05 MED ORDER — CEPHALEXIN 500 MG PO CAPS: 500.0000 mg | ORAL_CAPSULE | Freq: Two times a day (BID) | ORAL | 0 refills | Status: DC

## 2017-11-05 MED ORDER — ACETAMINOPHEN 10 MG/ML IV SOLN: 1000.0000 mg | Freq: Once | INTRAVENOUS | Status: DC | PRN

## 2017-11-05 MED ORDER — DEXAMETHASONE SODIUM PHOSPHATE 4 MG/ML IJ SOLN
INTRAMUSCULAR | Status: DC | PRN
  Administered 2017-11-05: 12 mg via INTRAVENOUS

## 2017-11-05 MED ORDER — ONDANSETRON HCL 4 MG/2ML IJ SOLN: 4.0000 mg | Freq: Once | INTRAMUSCULAR | Status: DC | PRN

## 2017-11-05 MED ORDER — OXYCODONE HCL 5 MG/5ML PO SOLN: 5.0000 mg | Freq: Once | ORAL | Status: AC | PRN

## 2017-11-05 MED ORDER — OXYMETAZOLINE HCL 0.05 % NA SOLN
NASAL | Status: DC | PRN
  Administered 2017-11-05: 1 via TOPICAL

## 2017-11-05 MED ORDER — LACTATED RINGERS IV SOLN
INTRAVENOUS | Status: DC
  Administered 2017-11-05: 09:00:00 via INTRAVENOUS

## 2017-11-05 MED ORDER — SUCCINYLCHOLINE CHLORIDE 20 MG/ML IJ SOLN
INTRAMUSCULAR | Status: DC | PRN
  Administered 2017-11-05: 100 mg via INTRAVENOUS
  Administered 2017-11-05: 80 mg via INTRAVENOUS

## 2017-11-05 MED ORDER — MIDAZOLAM HCL 5 MG/5ML IJ SOLN
INTRAMUSCULAR | Status: DC | PRN
  Administered 2017-11-05: 2 mg via INTRAVENOUS

## 2017-11-05 MED ORDER — FENTANYL CITRATE (PF) 100 MCG/2ML IJ SOLN
INTRAMUSCULAR | Status: DC | PRN
  Administered 2017-11-05: 100 ug via INTRAVENOUS

## 2017-11-05 MED ORDER — LIDOCAINE HCL (CARDIAC) 20 MG/ML IV SOLN
INTRAVENOUS | Status: DC | PRN
  Administered 2017-11-05: 50 mg via INTRATRACHEAL

## 2017-11-05 MED ORDER — PHENYLEPHRINE HCL 10 MG/ML IJ SOLN
INTRAMUSCULAR | Status: DC | PRN
  Administered 2017-11-05 (×8): 100 ug via INTRAVENOUS

## 2017-11-05 MED ORDER — FENTANYL CITRATE (PF) 100 MCG/2ML IJ SOLN
25.0000 ug | INTRAMUSCULAR | Status: DC | PRN
  Administered 2017-11-05 (×2): 25 ug via INTRAVENOUS

## 2017-11-05 MED ORDER — OXYCODONE HCL 5 MG PO TABS
5.0000 mg | ORAL_TABLET | Freq: Once | ORAL | Status: AC | PRN
  Administered 2017-11-05: 5 mg via ORAL

## 2017-11-05 MED ORDER — PROPOFOL 10 MG/ML IV BOLUS
INTRAVENOUS | Status: DC | PRN
  Administered 2017-11-05: 100 mg via INTRAVENOUS
  Administered 2017-11-05: 200 mg via INTRAVENOUS

## 2017-11-05 SURGICAL SUPPLY — 21 items
ADHESIVE MASTISOL STRL (MISCELLANEOUS) ×4 IMPLANT
AQUAPLAST 3X3 FLAT (MISCELLANEOUS) ×2
BASIN GRAD PLASTIC 32OZ STRL (MISCELLANEOUS) ×2 IMPLANT
CANISTER SUCT 1200ML W/VALVE (MISCELLANEOUS) ×2 IMPLANT
COVER MAYO STAND STRL (DRAPES) ×2 IMPLANT
CUP MEDICINE 2OZ PLAST GRAD ST (MISCELLANEOUS) ×2 IMPLANT
GAUZE SPONGE 4X4 12PLY STRL (GAUZE/BANDAGES/DRESSINGS) ×2 IMPLANT
GLOVE BIO SURGEON STRL SZ7.5 (GLOVE) ×2 IMPLANT
KIT ROOM TURNOVER OR (KITS) ×2 IMPLANT
MARKER SKIN DUAL TIP RULER LAB (MISCELLANEOUS) ×2 IMPLANT
NEEDLE HYPO 25GX1X1/2 BEV (NEEDLE) ×2 IMPLANT
PATTIES SURGICAL .5 X3 (DISPOSABLE) ×2 IMPLANT
SPLINT AQUAPLAST 3X3 FLAT (MISCELLANEOUS) ×1 IMPLANT
STRIP CLOSURE SKIN 1/2X4 (GAUZE/BANDAGES/DRESSINGS) ×4 IMPLANT
SUT CHROMIC 4 0 RB 1X27 (SUTURE) ×2 IMPLANT
SUT PLAIN GUT 4-0 (SUTURE) ×2 IMPLANT
SYRINGE 10CC LL (SYRINGE) ×2 IMPLANT
TOWEL OR 17X26 4PK STRL BLUE (TOWEL DISPOSABLE) ×2 IMPLANT
TUBING CONN 6MMX3.1M (TUBING) ×1
TUBING SUCTION CONN 0.25 STRL (TUBING) ×1 IMPLANT
WAND COBLATOR ENT REFLEX ULT45 (MISCELLANEOUS) ×2 IMPLANT

## 2017-11-05 NOTE — Anesthesia Procedure Notes (Signed)
Procedure Name: Intubation Date/Time: 11/05/2017 10:52 AM Performed by: Londell Moh, CRNA Pre-anesthesia Checklist: Patient identified, Emergency Drugs available, Suction available, Patient being monitored and Timeout performed Patient Re-evaluated:Patient Re-evaluated prior to induction Oxygen Delivery Method: Circle system utilized Preoxygenation: Pre-oxygenation with 100% oxygen Induction Type: IV induction Ventilation: Mask ventilation without difficulty and Oral airway inserted - appropriate to patient size Laryngoscope Size: Glidescope and 3 Grade View: Grade IV Tube type: Oral Rae Tube size: 7.5 mm Number of attempts: 3 Airway Equipment and Method: Rigid stylet,  Bougie stylet and Video-laryngoscopy Placement Confirmation: ETT inserted through vocal cords under direct vision,  positive ETCO2 and breath sounds checked- equal and bilateral Tube secured with: Tape Dental Injury: Teeth and Oropharynx as per pre-operative assessment  Difficulty Due To: Difficulty was anticipated, Difficult Airway- due to anterior larynx, Difficult Airway- due to large tongue, Difficult Airway- due to limited oral opening and Difficult Airway- due to dentition Future Recommendations: Recommend- induction with short-acting agent, and alternative techniques readily available Comments: Successful intubation utilizing bougie stylet with glidescope 3.

## 2017-11-05 NOTE — H&P (Signed)
History and physical reviewed and will be scanned in later. No change in medical status reported by the patient or family, appears stable for surgery. All questions regarding the procedure answered, and patient (or family if a child) expressed understanding of the procedure.  Thomas Huang S @TODAY@ 

## 2017-11-05 NOTE — Discharge Instructions (Signed)
[facility] °[facility] °ENDOSCOPIC SINUS SURGERY °[facility] ° °What is Functional Endoscopic Sinus Surgery? ° The Surgery involves making the natural openings of the sinuses larger by removing the bony partitions that separate the sinuses from the nasal cavity.  The natural sinus lining is preserved as much as possible to allow the sinuses to resume normal function after the surgery.  In some patients nasal polyps (excessively swollen lining of the sinuses) may be removed to relieve obstruction of the sinus openings.  The surgery is performed through the nose using lighted scopes, which eliminates the need for incisions on the face.  A septoplasty is a different procedure which is sometimes performed with sinus surgery.  It involves straightening the boy partition that separates the two sides of your nose.  A crooked or deviated septum may need repair if is obstructing the sinuses or nasal airflow.  Turbinate reduction is also often performed during sinus surgery.  The turbinates are bony proturberances from the side walls of the nose which swell and can obstruct the nose in patients with sinus and allergy problems.  Their size can be surgically reduced to help relieve nasal obstruction. ° °What Can Sinus Surgery Do For Me? ° Sinus surgery can reduce the frequency of sinus infections requiring antibiotic treatment.  This can provide improvement in nasal congestion, post-nasal drainage, facial pressure and nasal obstruction.  Surgery will NOT prevent you from ever having an infection again, so it usually only for patients who get infections 4 or more times yearly requiring antibiotics, or for infections that do not clear with antibiotics.  It will not cure nasal allergies, so patients with allergies may still require medication to treat their allergies after surgery. Surgery may improve headaches related to sinusitis, however, some people will continue to  require medication to control sinus headaches related to allergies.  Surgery will do nothing for other forms of headache (migraine, tension or cluster). ° °What Are the Risks of Endoscopic Sinus Surgery? ° Current techniques allow surgery to be performed safely with little risk, however, there are rare complications that patients should be aware of.  Because the sinuses are located around the eyes, there is risk of eye injury, including blindness, though again, this would be quite rare. This is usually a result of bleeding behind the eye during surgery, which puts the vision oat risk, though there are treatments to protect the vision and prevent permanent disrupted by surgery causing a leak of the spinal fluid that surrounds the brain.  More serious complications would include bleeding inside the brain cavity or damage to the brain.  Again, all of these complications are uncommon, and spinal fluid leaks can be safely managed surgically if they occur.  The most common complication of sinus surgery is bleeding from the nose, which may require packing or cauterization of the nose.  Continued sinus have polyps may experience recurrence of the polyps requiring revision surgery.  Alterations of sense of smell or injury to the tear ducts are also rare complications.  ° °What is the Surgery Like, and what is the Recovery? ° The Surgery usually takes a couple of hours to perform, and is usually performed under a general anesthetic (completely asleep).  Patients are usually discharged home after a couple of hours.  Sometimes during surgery it is necessary to pack the nose to control bleeding, and the packing is left in place for 24 - 48 hours, and removed by your surgeon.    If a septoplasty was performed during the procedure, there is often a splint placed which must be removed after 5-7 days.   °Discomfort: Pain is usually mild to moderate, and can be controlled by prescription pain medication or acetaminophen (Tylenol).   Aspirin, Ibuprofen (Advil, Motrin), or Naprosyn (Aleve) should be avoided, as they can cause increased bleeding.  Most patients feel sinus pressure like they have a bad head cold for several days.  Sleeping with your head elevated can help reduce swelling and facial pressure, as can ice packs over the face.  A humidifier may be helpful to keep the mucous and blood from drying in the nose.  ° °Diet: There are no specific diet restrictions, however, you should generally start with clear liquids and a light diet of bland foods because the anesthetic can cause some nausea.  Advance your diet depending on how your stomach feels.  Taking your pain medication with food will often help reduce stomach upset which pain medications can cause. ° °Nasal Saline Irrigation: It is important to remove blood clots and dried mucous from the nose as it is healing.  This is done by having you irrigate the nose at least 3 - 4 times daily with a salt water solution.  We recommend using NeilMed Sinus Rinse (available at the drug store).  Fill the squeeze bottle with the solution, bend over a sink, and insert the tip of the squeeze bottle into the nose ½ of an inch.  Point the tip of the squeeze bottle towards the inside corner of the eye on the same side your irrigating.  Squeeze the bottle and gently irrigate the nose.  If you bend forward as you do this, most of the fluid will flow back out of the nose, instead of down your throat.   The solution should be warm, near body temperature, when you irrigate.   Each time you irrigate, you should use a full squeeze bottle.  ° °Note that if you are instructed to use Nasal Steroid Sprays at any time after your surgery, irrigate with saline BEFORE using the steroid spray, so you do not wash it all out of the nose. °Another product, Nasal Saline Gel (such as AYR Nasal Saline Gel) can be applied in each nostril 3 - 4 times daily to moisture the nose and reduce scabbing or crusting. ° °Bleeding:   Bloody drainage from the nose can be expected for several days, and patients are instructed to irrigate their nose frequently with salt water to help remove mucous and blood clots.  The drainage may be dark red or brown, though some fresh blood may be seen intermittently, especially after irrigation.  Do not blow you nose, as bleeding may occur. If you must sneeze, keep your mouth open to allow air to escape through your mouth. ° °If heavy bleeding occurs: Irrigate the nose with saline to rinse out clots, then spray the nose 3 - 4 times with Afrin Nasal Decongestant Spray.  The spray will constrict the blood vessels to slow bleeding.  Pinch the lower half of your nose shut to apply pressure, and lay down with your head elevated.  Ice packs over the nose may help as well. If bleeding persists despite these measures, you should notify your doctor.  Do not use the Afrin routinely to control nasal congestion after surgery, as it can result in worsening congestion and may affect healing.  ° ° ° °Activity: Return to work varies among patients. Most patients will be   out of work at least 5 - 7 days to recover.  Patient may return to work after they are off of narcotic pain medication, and feeling well enough to perform the functions of their job.  Patients must avoid heavy lifting (over 10 pounds) or strenuous physical for 2 weeks after surgery, so your employer may need to assign you to light duty, or keep you out of work longer if light duty is not possible.  NOTE: you should not drive, operate dangerous machinery, do any mentally demanding tasks or make any important legal or financial decisions while on narcotic pain medication and recovering from the general anesthetic.  °  °Call Your Doctor Immediately if You Have Any of the Following: °1. Bleeding that you cannot control with the above measures °2. Loss of vision, double vision, bulging of the eye or black eyes. °3. Fever over 101 degrees °4. Neck stiffness with  severe headache, fever, nausea and change in mental state. °You are always encourage to call anytime with concerns, however, please call with requests for pain medication refills during office hours. ° °Office Endoscopy: During follow-up visits your doctor will remove any packing or splints that may have been placed and evaluate and clean your sinuses endoscopically.  Topical anesthetic will be used to make this as comfortable as possible, though you may want to take your pain medication prior to the visit.  How often this will need to be done varies from patient to patient.  After complete recovery from the surgery, you may need follow-up endoscopy from time to time, particularly if there is concern of recurrent infection or nasal polyps. ° ° °General Anesthesia, Adult, Care After °These instructions provide you with information about caring for yourself after your procedure. Your health care provider may also give you more specific instructions. Your treatment has been planned according to current medical practices, but problems sometimes occur. Call your health care provider if you have any problems or questions after your procedure. °What can I expect after the procedure? °After the procedure, it is common to have: °· Vomiting. °· A sore throat. °· Mental slowness. ° °It is common to feel: °· Nauseous. °· Cold or shivery. °· Sleepy. °· Tired. °· Sore or achy, even in parts of your body where you did not have surgery. ° °Follow these instructions at home: °For at least 24 hours after the procedure: °· Do not: °? Participate in activities where you could fall or become injured. °? Drive. °? Use heavy machinery. °? Drink alcohol. °? Take sleeping pills or medicines that cause drowsiness. °? Make important decisions or sign legal documents. °? Take care of children on your own. °· Rest. °Eating and drinking °· If you vomit, drink water, juice, or soup when you can drink without vomiting. °· Drink enough fluid to  keep your urine clear or pale yellow. °· Make sure you have little or no nausea before eating solid foods. °· Follow the diet recommended by your health care provider. °General instructions °· Have a responsible adult stay with you until you are awake and alert. °· Return to your normal activities as told by your health care provider. Ask your health care provider what activities are safe for you. °· Take over-the-counter and prescription medicines only as told by your health care provider. °· If you smoke, do not smoke without supervision. °· Keep all follow-up visits as told by your health care provider. This is important. °Contact a health care provider if: °· You   continue to have nausea or vomiting at home, and medicines are not helpful. °· You cannot drink fluids or start eating again. °· You cannot urinate after 8-12 hours. °· You develop a skin rash. °· You have fever. °· You have increasing redness at the site of your procedure. °Get help right away if: °· You have difficulty breathing. °· You have chest pain. °· You have unexpected bleeding. °· You feel that you are having a life-threatening or urgent problem. °This information is not intended to replace advice given to you by your health care provider. Make sure you discuss any questions you have with your health care provider. °Document Released: 02/18/2001 Document Revised: 04/16/2016 Document Reviewed: 10/27/2015 °Elsevier Interactive Patient Education © 2018 [employer] ° °

## 2017-11-05 NOTE — Transfer of Care (Signed)
Immediate Anesthesia Transfer of Care Note  Patient: IDA UPPAL  Procedure(s) Performed: CLOSED REDUCTION NASAL FRACTURE WITH NASAL TURBINATE COBLATION (N/A Nose)  Patient Location: PACU  Anesthesia Type: General  Level of Consciousness: awake, alert  and patient cooperative  Airway and Oxygen Therapy: Patient Spontanous Breathing and Patient connected to supplemental oxygen  Post-op Assessment: Post-op Vital signs reviewed, Patient's Cardiovascular Status Stable, Respiratory Function Stable, Patent Airway and No signs of Nausea or vomiting  Post-op Vital Signs: Reviewed and stable  Complications: No apparent anesthesia complications

## 2017-11-05 NOTE — Op Note (Signed)
11/05/2017 11:40 AM  [name] [id number]   Pre-Op Dx: NASAL FRACTURE, SEPTAL DEVIATION, INFERIOR TURBINATE HYPERTROPHY  Post-op Dx: SAME  Procedure: 1) Nasal septoplasty, 2) Closed reduction of nasal fracture, 3) Bilateral intramural cautery of the inferior turbinates (with coblation)  Surgeon: Riley Nearing., MD  Anesthesia: General Endotracheal   EBL: less than 25 cc  Complications: None   Findings: Nasal dorsum deviated to the right with bilateral depressed nasal bone fracture, right septal deviation, inferior turbinate hypertrophy   Procedure: After the patient was identified in holding and the history and physical and consent was reviewed, the patient was taken to the operating room and placed in a supine position. General endotracheal anesthesia was induced in the normal fashion. The patient was taken to the Operating Room and placed in the supine position.  After induction of general endotracheal anesthesia, the table was turned 90 degrees. A time-out was issued to confirm the site and procedure. The nasal septum and inferior turbinates where then injected with 1% lidocaine with epiniephrine, 1:200,000. The nose was decongested with Afrin soaked pledgets. The patient was then prepped and draped in the usual sterile fashion. Beginning on the left hand side a hemitransfixion incision was then created on the leading edge of the septum on the left.  A subperichondrial plane was elevated posteriorly on the left and taken back to the perpendicular plate of the ethmoid where subperiosteal plane was elevated posteriorly on the left. A large septal spur was identified on the right hand side.  An inferior rim of redundant septal cartilage was removed from where it deviated over the maxillary crest. The perpendicular plate of the ethmoid was separated from the quadrangular cartilage, and a subperiosteal plane elevated on the right of the bony septum. The large septal spur was removed,  dividing the septal bone superiorly with Knight scissors, and inferiorly from the maxillary crest with a chisel.    The septum was then replaced in the midline. Reinspection through each nostril showed excellent reduction of the septal deformity. A left posterior inferior fenestration was then created to allow hematoma drainage.  The septal incision was closed with 4-0 chromic gut suture. A 4-0 plain gut suture was used to reapproximate the septal flaps to the underlying cartilage, utilizing a running, quilting type stitch.   With the septoplasty completed, beginning on the left-hand side, the inferior turbinate was addressed with the coblator, inserting the probe into the turbinate tissue and coblating on a setting of 4 watts for 10 seconds. A second pass was then made with the probe. The same procedure was then performed on the right side with 2 passes of the coblator. Each turbnate was then out-fractured with the Boies elevator.  A Boies elevator was then placed intranasally, and used to manipulate the nasal bone fragments until the nasal dorsum appeared to be midline with no palpable step-off deformity.  Following this, the skin was prepped with Mastisol, and 1/2 Ster-strips applied to the nose. Next an Aquaplast splint was fashioned to fit the nasal dorsum, and placed for protection of the nasal bones during healing. This was further secured with Steri-strips. The care of patient was returned to anesthesia, awakened, and transferred to recovery in stable condition.   Disposition: PACU to home   Plan: Regular diet. Ice pack to nose as needed for pain and swelling. Keep nasal splint in place until follow-up. May begin saline irrigations tomorrow to clear secretions. Take pain meds and antibiotics as prescribed. Limit exercise and strenuous  activity for the next week. Recheck my office once week.   Riley Nearing., MD  11:40 AM 11/05/2017

## 2017-11-05 NOTE — Anesthesia Preprocedure Evaluation (Signed)
Anesthesia Evaluation  Patient identified by MRN, date of birth, ID band Patient awake    History of Anesthesia Complications Negative for: history of anesthetic complications  Airway Mallampati: IV   Neck ROM: Full  Mouth opening: Limited Mouth Opening  Dental  (+)    Pulmonary sleep apnea and Continuous Positive Airway Pressure Ventilation ,    Pulmonary exam normal breath sounds clear to auscultation       Cardiovascular hypertension, + Valvular Problems/Murmurs (murmur)  Rhythm:Regular Rate:Normal + Systolic murmurs    Neuro/Psych Vertigo     GI/Hepatic GERD  ,  Endo/Other  diabetes, Type 2  Renal/GU Renal disease (stage III CKD)     Musculoskeletal  (+) Arthritis , Osteoarthritis,    Abdominal   Peds  Hematology negative hematology ROS (+)   Anesthesia Other Findings   Reproductive/Obstetrics                             Anesthesia Physical Anesthesia Plan  ASA: III  Anesthesia Plan: General   Post-op Pain Management:    Induction: Intravenous  PONV Risk Score and Plan: 1 and Ondansetron  Airway Management Planned: Oral ETT  Additional Equipment:   Intra-op Plan:   Post-operative Plan: Extubation in OR  Informed Consent: I have reviewed the patients History and Physical, chart, labs and discussed the procedure including the risks, benefits and alternatives for the proposed anesthesia with the patient or authorized representative who has indicated his/her understanding and acceptance.   Dental advisory given  Plan Discussed with: CRNA  Anesthesia Plan Comments:         Anesthesia Quick Evaluation

## 2017-11-05 NOTE — Anesthesia Postprocedure Evaluation (Signed)
Anesthesia Post Note  Patient: Thomas Huang  Procedure(s) Performed: CLOSED REDUCTION NASAL FRACTURE WITH NASAL TURBINATE COBLATION (N/A Nose)  Patient location during evaluation: PACU Anesthesia Type: General Level of consciousness: awake and alert, oriented and patient cooperative Pain management: pain level controlled Vital Signs Assessment: post-procedure vital signs reviewed and stable Respiratory status: spontaneous breathing, nonlabored ventilation and respiratory function stable Cardiovascular status: blood pressure returned to baseline and stable Postop Assessment: adequate PO intake Anesthetic complications: no    Darrin Nipper

## 2017-11-06 ENCOUNTER — Encounter: Payer: Self-pay | Admitting: Otolaryngology

## 2017-11-16 ENCOUNTER — Other Ambulatory Visit: Payer: Self-pay | Admitting: Family Medicine

## 2017-11-20 ENCOUNTER — Other Ambulatory Visit: Payer: Self-pay | Admitting: Family Medicine

## 2017-12-06 DIAGNOSIS — H6123 Impacted cerumen, bilateral: Secondary | ICD-10-CM | POA: Diagnosis not present

## 2017-12-07 ENCOUNTER — Other Ambulatory Visit: Payer: Self-pay | Admitting: Family Medicine

## 2017-12-07 DIAGNOSIS — R3911 Hesitancy of micturition: Principal | ICD-10-CM

## 2017-12-07 DIAGNOSIS — N401 Enlarged prostate with lower urinary tract symptoms: Secondary | ICD-10-CM

## 2017-12-09 NOTE — Telephone Encounter (Signed)
Don't see reference to these meds in notes.

## 2017-12-19 ENCOUNTER — Encounter: Payer: Self-pay | Admitting: Family Medicine

## 2017-12-19 ENCOUNTER — Ambulatory Visit (INDEPENDENT_AMBULATORY_CARE_PROVIDER_SITE_OTHER): Payer: Medicare Other | Admitting: Family Medicine

## 2017-12-19 VITALS — BP 129/76 | HR 86 | Ht 67.52 in | Wt 227.0 lb

## 2017-12-19 DIAGNOSIS — G4733 Obstructive sleep apnea (adult) (pediatric): Secondary | ICD-10-CM

## 2017-12-19 DIAGNOSIS — Z7189 Other specified counseling: Secondary | ICD-10-CM

## 2017-12-19 DIAGNOSIS — I129 Hypertensive chronic kidney disease with stage 1 through stage 4 chronic kidney disease, or unspecified chronic kidney disease: Secondary | ICD-10-CM | POA: Diagnosis not present

## 2017-12-19 DIAGNOSIS — N183 Chronic kidney disease, stage 3 (moderate): Secondary | ICD-10-CM

## 2017-12-19 DIAGNOSIS — E7800 Pure hypercholesterolemia, unspecified: Secondary | ICD-10-CM

## 2017-12-19 DIAGNOSIS — R3911 Hesitancy of micturition: Secondary | ICD-10-CM | POA: Diagnosis not present

## 2017-12-19 DIAGNOSIS — E0821 Diabetes mellitus due to underlying condition with diabetic nephropathy: Secondary | ICD-10-CM

## 2017-12-19 DIAGNOSIS — M48061 Spinal stenosis, lumbar region without neurogenic claudication: Secondary | ICD-10-CM

## 2017-12-19 DIAGNOSIS — Z9989 Dependence on other enabling machines and devices: Secondary | ICD-10-CM

## 2017-12-19 DIAGNOSIS — Z1329 Encounter for screening for other suspected endocrine disorder: Secondary | ICD-10-CM | POA: Diagnosis not present

## 2017-12-19 DIAGNOSIS — N401 Enlarged prostate with lower urinary tract symptoms: Secondary | ICD-10-CM

## 2017-12-19 DIAGNOSIS — K862 Cyst of pancreas: Secondary | ICD-10-CM

## 2017-12-19 DIAGNOSIS — Z114 Encounter for screening for human immunodeficiency virus [HIV]: Secondary | ICD-10-CM | POA: Diagnosis not present

## 2017-12-19 LAB — URINALYSIS, ROUTINE W REFLEX MICROSCOPIC
BILIRUBIN UA: NEGATIVE
Glucose, UA: NEGATIVE
KETONES UA: NEGATIVE
Leukocytes, UA: NEGATIVE
NITRITE UA: NEGATIVE
Protein, UA: NEGATIVE
RBC, UA: NEGATIVE
Specific Gravity, UA: 1.02 (ref 1.005–1.030)
Urobilinogen, Ur: 0.2 mg/dL (ref 0.2–1.0)
pH, UA: 5.5 (ref 5.0–7.5)

## 2017-12-19 LAB — BAYER DCA HB A1C WAIVED: HB A1C (BAYER DCA - WAIVED): 6.5 % (ref <7.0)

## 2017-12-19 MED ORDER — OMEPRAZOLE 20 MG PO CPDR: 20.0000 mg | DELAYED_RELEASE_CAPSULE | Freq: Every day | ORAL | 4 refills | Status: DC

## 2017-12-19 MED ORDER — LISINOPRIL 10 MG PO TABS: 10.0000 mg | ORAL_TABLET | Freq: Every day | ORAL | 4 refills | Status: DC

## 2017-12-19 MED ORDER — FINASTERIDE 5 MG PO TABS: 5.0000 mg | ORAL_TABLET | Freq: Every day | ORAL | 4 refills | Status: DC

## 2017-12-19 MED ORDER — GLIMEPIRIDE 2 MG PO TABS: 2.0000 mg | ORAL_TABLET | Freq: Every day | ORAL | 4 refills | Status: DC

## 2017-12-19 MED ORDER — TAMSULOSIN HCL 0.4 MG PO CAPS: 0.4000 mg | ORAL_CAPSULE | Freq: Every day | ORAL | 4 refills | Status: DC

## 2017-12-19 MED ORDER — ATORVASTATIN CALCIUM 10 MG PO TABS: 10.0000 mg | ORAL_TABLET | Freq: Every day | ORAL | 4 refills | Status: DC

## 2017-12-19 NOTE — Assessment & Plan Note (Signed)
The current medical regimen is effective;  continue present plan and medications.  

## 2017-12-19 NOTE — Assessment & Plan Note (Signed)
Followed by [facility] stable for now

## 2017-12-19 NOTE — Progress Notes (Addendum)
BP 129/76   Pulse 86   Ht 5' 7.52" (1.715 m)   Wt 227 lb (103 kg)   SpO2 95%   BMI 35.01 kg/m    Subjective:    Patient ID: Thomas Huang, male    DOB: August 27, 1951, 67 y.o.   MRN: [id number]  HPI: Thomas Huang is a 67 y.o. male  Chief Complaint  Patient presents with  . Annual Exam   Patient all in all doing well no complaints cholesterol blood pressure diabetes all good control without any symptoms taking minimal medications. BPH well controlled Followed by [facility] nephrology for CKD class III without problems. Followed by [facility] for pancreatic cyst which is asymptomatic. Has some allergy symptoms using OTC occasions and Astlin Relevant past medical, surgical, family and social history reviewed and updated as indicated. Interim medical history since our last visit reviewed. Allergies and medications reviewed and updated.  Review of Systems  Constitutional: Negative.   HENT: Negative.   Eyes: Negative.   Respiratory: Negative.   Cardiovascular: Negative.   Gastrointestinal: Negative.   Endocrine: Negative.   Genitourinary: Negative.   Musculoskeletal: Negative.   Skin: Negative.   Allergic/Immunologic: Negative.   Neurological: Negative.   Hematological: Negative.   Psychiatric/Behavioral: Negative.     Per HPI unless specifically indicated above     Objective:    BP 129/76   Pulse 86   Ht 5' 7.52" (1.715 m)   Wt 227 lb (103 kg)   SpO2 95%   BMI 35.01 kg/m   Wt Readings from Last 3 Encounters:  12/19/17 227 lb (103 kg)  11/05/17 219 lb (99.3 kg)  10/23/17 225 lb (102.1 kg)    Physical Exam  Constitutional: He is oriented to person, place, and time. He appears well-developed and well-nourished.  HENT:  Head: Normocephalic and atraumatic.  Right Ear: External ear normal.  Left Ear: External ear normal.  Eyes: Conjunctivae and EOM are normal. Pupils are equal, round, and reactive to light.  Neck: Normal range of motion. Neck supple.  Cardiovascular:  Normal rate, regular rhythm, normal heart sounds and intact distal pulses.  Pulmonary/Chest: Effort normal and breath sounds normal.  Abdominal: Soft. Bowel sounds are normal. There is no splenomegaly or hepatomegaly.  Genitourinary: Rectum normal, prostate normal and penis normal.  Musculoskeletal: Normal range of motion.  Neurological: He is alert and oriented to person, place, and time. He has normal reflexes.  Skin: No rash noted. No erythema.  Psychiatric: He has a normal mood and affect. His behavior is normal. Judgment and thought content normal.    Results for orders placed or performed in visit on 12/19/17  CBC with Differential/Platelet  Result Value Ref Range   WBC 6.0 3.4 - 10.8 x10E3/uL   RBC 4.22 4.14 - 5.80 x10E6/uL   Hemoglobin 12.5 (L) 13.0 - 17.7 g/dL   Hematocrit 39.0 37.5 - 51.0 %   MCV 92 79 - 97 fL   MCH 29.6 26.6 - 33.0 pg   MCHC 32.1 31.5 - 35.7 g/dL   RDW 15.4 12.3 - 15.4 %   Platelets 171 150 - 379 x10E3/uL   Neutrophils 55 Not Estab. %   Lymphs 23 Not Estab. %   Monocytes 12 Not Estab. %   Eos 9 Not Estab. %   Basos 1 Not Estab. %   Neutrophils Absolute 3.3 1.4 - 7.0 x10E3/uL   Lymphocytes Absolute 1.4 0.7 - 3.1 x10E3/uL   Monocytes Absolute 0.7 0.1 - 0.9 x10E3/uL  EOS (ABSOLUTE) 0.6 (H) 0.0 - 0.4 x10E3/uL   Basophils Absolute 0.0 0.0 - 0.2 x10E3/uL   Immature Granulocytes 0 Not Estab. %   Immature Grans (Abs) 0.0 0.0 - 0.1 x10E3/uL  Comprehensive metabolic panel  Result Value Ref Range   Glucose 127 (H) 65 - 99 mg/dL   BUN 24 8 - 27 mg/dL   Creatinine, Ser 2.06 (H) 0.76 - 1.27 mg/dL   GFR calc non Af Amer 33 (L) >59 mL/min/1.73   GFR calc Af Amer 38 (L) >59 mL/min/1.73   BUN/Creatinine Ratio 12 10 - 24   Sodium 140 134 - 144 mmol/L   Potassium 4.6 3.5 - 5.2 mmol/L   Chloride 103 96 - 106 mmol/L   CO2 23 20 - 29 mmol/L   Calcium 9.6 8.6 - 10.2 mg/dL   Total Protein 7.3 6.0 - 8.5 g/dL   Albumin 4.5 3.6 - 4.8 g/dL   Globulin, Total 2.8 1.5 -  4.5 g/dL   Albumin/Globulin Ratio 1.6 1.2 - 2.2   Bilirubin Total 0.3 0.0 - 1.2 mg/dL   Alkaline Phosphatase 68 39 - 117 IU/L   AST 24 0 - 40 IU/L   ALT 26 0 - 44 IU/L  Lipid panel  Result Value Ref Range   Cholesterol, Total 137 100 - 199 mg/dL   Triglycerides 180 (H) 0 - 149 mg/dL   HDL 33 (L) >39 mg/dL   VLDL Cholesterol Cal 36 5 - 40 mg/dL   LDL Calculated 68 0 - 99 mg/dL   Chol/HDL Ratio 4.2 0.0 - 5.0 ratio  PSA  Result Value Ref Range   Prostate Specific Ag, Serum 0.4 0.0 - 4.0 ng/mL  TSH  Result Value Ref Range   TSH 3.430 0.450 - 4.500 uIU/mL  Urinalysis, Routine w reflex microscopic  Result Value Ref Range   Specific Gravity, UA 1.020 1.005 - 1.030   pH, UA 5.5 5.0 - 7.5   Color, UA Yellow Yellow   Appearance Ur Clear Clear   Leukocytes, UA Negative Negative   Protein, UA Negative Negative/Trace   Glucose, UA Negative Negative   Ketones, UA Negative Negative   RBC, UA Negative Negative   Bilirubin, UA Negative Negative   Urobilinogen, Ur 0.2 0.2 - 1.0 mg/dL   Nitrite, UA Negative Negative  Bayer DCA Hb A1c Waived  Result Value Ref Range   Bayer DCA Hb A1c Waived 6.5 <7.0 %      Assessment & Plan:   Problem List Items Addressed This Visit      Respiratory   OSA on CPAP    yses CPAP        Digestive   Pancreatic cyst    No sx        Endocrine   Diabetes mellitus with renal manifestation (HCC) - Primary    The current medical regimen is effective;  continue present plan and medications.       Relevant Medications   glimepiride (AMARYL) 2 MG tablet   lisinopril (PRINIVIL,ZESTRIL) 10 MG tablet   atorvastatin (LIPITOR) 10 MG tablet   Other Relevant Orders   CBC with Differential/Platelet (Completed)   Comprehensive metabolic panel (Completed)   TSH (Completed)   Urinalysis, Routine w reflex microscopic (Completed)   Bayer DCA Hb A1c Waived (Completed)     Genitourinary   Hypertensive CKD (chronic kidney disease)   Relevant Medications    lisinopril (PRINIVIL,ZESTRIL) 10 MG tablet   Other Relevant Orders   CBC with Differential/Platelet (Completed)  Comprehensive metabolic panel (Completed)   Lipid panel (Completed)   TSH (Completed)   Urinalysis, Routine w reflex microscopic (Completed)   Bayer DCA Hb A1c Waived (Completed)   BPH (benign prostatic hyperplasia)    The current medical regimen is effective;  continue present plan and medications.       Relevant Medications   finasteride (PROSCAR) 5 MG tablet   tamsulosin (FLOMAX) 0.4 MG CAPS capsule   Other Relevant Orders   PSA (Completed)   TSH (Completed)     Other   Hyperlipidemia    The current medical regimen is effective;  continue present plan and medications.       Relevant Medications   lisinopril (PRINIVIL,ZESTRIL) 10 MG tablet   atorvastatin (LIPITOR) 10 MG tablet   Other Relevant Orders   CBC with Differential/Platelet (Completed)   Comprehensive metabolic panel (Completed)   Lipid panel (Completed)   Urinalysis, Routine w reflex microscopic (Completed)   Bayer DCA Hb A1c Waived (Completed)   Spinal stenosis of lumbar region    Followed by [facility] stable for now      Advanced care planning/counseling discussion    A voluntary discussion about advance care planning including the explanation and discussion of advance directives was extensively discussed  with the patient.  Explanation about the health care proxy and Living will was reviewed and packet with forms with explanation of how to fill them out was given.         Other Visit Diagnoses    Thyroid disorder screen       Relevant Orders   TSH (Completed)   Encounter for screening for HIV       Relevant Orders   HIV antibody       Follow up plan: Return in about 6 months (around 06/18/2018) for Hemoglobin A1c, BMP,  Lipids, ALT, AST.

## 2017-12-19 NOTE — Assessment & Plan Note (Signed)
yses CPAP

## 2017-12-19 NOTE — Assessment & Plan Note (Signed)
No sx 

## 2017-12-19 NOTE — Assessment & Plan Note (Addendum)
A voluntary discussion about advance care planning including the explanation and discussion of advance directives was extensively discussed  with the patient.  Explanation about the health care proxy and Living will was reviewed and packet with forms with explanation of how to fill them out was given.  16+ min and pt only

## 2017-12-20 LAB — CBC WITH DIFFERENTIAL/PLATELET
Basophils Absolute: 0 10*3/uL (ref 0.0–0.2)
Basos: 1 %
EOS (ABSOLUTE): 0.6 10*3/uL — ABNORMAL HIGH (ref 0.0–0.4)
EOS: 9 %
HEMATOCRIT: 39 % (ref 37.5–51.0)
HEMOGLOBIN: 12.5 g/dL — AB (ref 13.0–17.7)
IMMATURE GRANS (ABS): 0 10*3/uL (ref 0.0–0.1)
Immature Granulocytes: 0 %
LYMPHS ABS: 1.4 10*3/uL (ref 0.7–3.1)
LYMPHS: 23 %
MCH: 29.6 pg (ref 26.6–33.0)
MCHC: 32.1 g/dL (ref 31.5–35.7)
MCV: 92 fL (ref 79–97)
MONOCYTES: 12 %
Monocytes Absolute: 0.7 10*3/uL (ref 0.1–0.9)
NEUTROS ABS: 3.3 10*3/uL (ref 1.4–7.0)
Neutrophils: 55 %
Platelets: 171 10*3/uL (ref 150–379)
RBC: 4.22 x10E6/uL (ref 4.14–5.80)
RDW: 15.4 % (ref 12.3–15.4)
WBC: 6 10*3/uL (ref 3.4–10.8)

## 2017-12-20 LAB — COMPREHENSIVE METABOLIC PANEL
ALBUMIN: 4.5 g/dL (ref 3.6–4.8)
ALK PHOS: 68 IU/L (ref 39–117)
ALT: 26 IU/L (ref 0–44)
AST: 24 IU/L (ref 0–40)
Albumin/Globulin Ratio: 1.6 (ref 1.2–2.2)
BILIRUBIN TOTAL: 0.3 mg/dL (ref 0.0–1.2)
BUN / CREAT RATIO: 12 (ref 10–24)
BUN: 24 mg/dL (ref 8–27)
CO2: 23 mmol/L (ref 20–29)
CREATININE: 2.06 mg/dL — AB (ref 0.76–1.27)
Calcium: 9.6 mg/dL (ref 8.6–10.2)
Chloride: 103 mmol/L (ref 96–106)
GFR calc non Af Amer: 33 mL/min/{1.73_m2} — ABNORMAL LOW (ref >59)
GFR, EST AFRICAN AMERICAN: 38 mL/min/{1.73_m2} — AB (ref >59)
GLOBULIN, TOTAL: 2.8 g/dL (ref 1.5–4.5)
GLUCOSE: 127 mg/dL — AB (ref 65–99)
Potassium: 4.6 mmol/L (ref 3.5–5.2)
SODIUM: 140 mmol/L (ref 134–144)
Total Protein: 7.3 g/dL (ref 6.0–8.5)

## 2017-12-20 LAB — LIPID PANEL
CHOLESTEROL TOTAL: 137 mg/dL (ref 100–199)
Chol/HDL Ratio: 4.2 ratio (ref 0.0–5.0)
HDL: 33 mg/dL — ABNORMAL LOW (ref >39)
LDL CALC: 68 mg/dL (ref 0–99)
Triglycerides: 180 mg/dL — ABNORMAL HIGH (ref 0–149)
VLDL CHOLESTEROL CAL: 36 mg/dL (ref 5–40)

## 2017-12-20 LAB — PSA: PROSTATE SPECIFIC AG, SERUM: 0.4 ng/mL (ref 0.0–4.0)

## 2017-12-20 LAB — TSH: TSH: 3.43 u[IU]/mL (ref 0.450–4.500)

## 2017-12-24 ENCOUNTER — Encounter: Payer: Self-pay | Admitting: Family Medicine

## 2018-03-18 DIAGNOSIS — K863 Pseudocyst of pancreas: Secondary | ICD-10-CM | POA: Diagnosis not present

## 2018-03-18 DIAGNOSIS — K862 Cyst of pancreas: Secondary | ICD-10-CM | POA: Diagnosis not present

## 2018-03-19 DIAGNOSIS — R935 Abnormal findings on diagnostic imaging of other abdominal regions, including retroperitoneum: Secondary | ICD-10-CM | POA: Diagnosis not present

## 2018-03-19 DIAGNOSIS — D379 Neoplasm of uncertain behavior of digestive organ, unspecified: Secondary | ICD-10-CM | POA: Diagnosis not present

## 2018-03-19 DIAGNOSIS — K862 Cyst of pancreas: Secondary | ICD-10-CM | POA: Diagnosis not present

## 2018-06-19 ENCOUNTER — Ambulatory Visit (INDEPENDENT_AMBULATORY_CARE_PROVIDER_SITE_OTHER): Payer: Medicare Other | Admitting: Family Medicine

## 2018-06-19 ENCOUNTER — Encounter: Payer: Self-pay | Admitting: Family Medicine

## 2018-06-19 VITALS — BP 129/75 | HR 76 | Temp 98.4°F | Ht 67.5 in | Wt 225.0 lb

## 2018-06-19 DIAGNOSIS — Z23 Encounter for immunization: Secondary | ICD-10-CM | POA: Diagnosis not present

## 2018-06-19 DIAGNOSIS — E785 Hyperlipidemia, unspecified: Secondary | ICD-10-CM

## 2018-06-19 DIAGNOSIS — I129 Hypertensive chronic kidney disease with stage 1 through stage 4 chronic kidney disease, or unspecified chronic kidney disease: Secondary | ICD-10-CM

## 2018-06-19 DIAGNOSIS — G4733 Obstructive sleep apnea (adult) (pediatric): Secondary | ICD-10-CM | POA: Diagnosis not present

## 2018-06-19 DIAGNOSIS — N183 Chronic kidney disease, stage 3 (moderate): Secondary | ICD-10-CM | POA: Diagnosis not present

## 2018-06-19 DIAGNOSIS — Z9989 Dependence on other enabling machines and devices: Secondary | ICD-10-CM

## 2018-06-19 DIAGNOSIS — I10 Essential (primary) hypertension: Secondary | ICD-10-CM | POA: Diagnosis not present

## 2018-06-19 DIAGNOSIS — E1122 Type 2 diabetes mellitus with diabetic chronic kidney disease: Secondary | ICD-10-CM

## 2018-06-19 LAB — LP+ALT+AST PICCOLO, WAIVED
ALT (SGPT) Piccolo, Waived: 27 U/L (ref 10–47)
AST (SGOT) Piccolo, Waived: 23 U/L (ref 11–38)
Chol/HDL Ratio Piccolo,Waive: 4.3 mg/dL
Cholesterol Piccolo, Waived: 128 mg/dL
HDL Chol Piccolo, Waived: 30 mg/dL — ABNORMAL LOW
LDL Chol Calc Piccolo Waived: 66 mg/dL
Triglycerides Piccolo,Waived: 163 mg/dL — ABNORMAL HIGH
VLDL Chol Calc Piccolo,Waive: 33 mg/dL — ABNORMAL HIGH

## 2018-06-19 LAB — BAYER DCA HB A1C WAIVED: HB A1C (BAYER DCA - WAIVED): 6.8 % (ref <7.0)

## 2018-06-19 NOTE — Assessment & Plan Note (Signed)
Discuss uses CPAP faithfully for over 20+ years uses every night with resolution of daytime symptoms.  Will need new supplies from time to time.

## 2018-06-19 NOTE — Assessment & Plan Note (Signed)
The current medical regimen is effective;  continue present plan and medications.  

## 2018-06-19 NOTE — Patient Instructions (Signed)
Pneumococcal Conjugate Vaccine (PCV13) What You Need to Know 1. Why get vaccinated? Vaccination can protect both children and adults from pneumococcal disease. Pneumococcal disease is caused by bacteria that can spread from person to person through close contact. It can cause ear infections, and it can also lead to more serious infections of the:  Lungs (pneumonia),  Blood (bacteremia), and  Covering of the brain and spinal cord (meningitis).  Pneumococcal pneumonia is most common among adults. Pneumococcal meningitis can cause deafness and brain damage, and it kills about 1 child in 10 who get it. Anyone can get pneumococcal disease, but children under 2 years of age and adults 65 years and older, people with certain medical conditions, and cigarette smokers are at the highest risk. Before there was a vaccine, the [place] saw:  more than 700 cases of meningitis,  about 13,000 blood infections,  about 5 million ear infections, and  about 200 deaths  in children under 5 each year from pneumococcal disease. Since vaccine became available, severe pneumococcal disease in these children has fallen by 88%. About 18,000 older adults die of pneumococcal disease each year in the [place]. Treatment of pneumococcal infections with penicillin and other drugs is not as effective as it used to be, because some strains of the disease have become resistant to these drugs. This makes prevention of the disease, through vaccination, even more important. 2. PCV13 vaccine Pneumococcal conjugate vaccine (called PCV13) protects against 13 types of pneumococcal bacteria. PCV13 is routinely given to children at 2, 4, 6, and 12-15 months of age. It is also recommended for children and adults 2 to 64 years of age with certain health conditions, and for all adults 65 years of age and older. Your doctor can give you details. 3. Some people should not get this vaccine Anyone who has ever had a  life-threatening allergic reaction to a dose of this vaccine, to an earlier pneumococcal vaccine called PCV7, or to any vaccine containing diphtheria toxoid (for example, DTaP), should not get PCV13. Anyone with a severe allergy to any component of PCV13 should not get the vaccine. Tell your doctor if the person being vaccinated has any severe allergies. If the person scheduled for vaccination is not feeling well, your healthcare provider might decide to reschedule the shot on another day. 4. Risks of a vaccine reaction With any medicine, including vaccines, there is a chance of reactions. These are usually mild and go away on their own, but serious reactions are also possible. Problems reported following PCV13 varied by age and dose in the series. The most common problems reported among children were:  About half became drowsy after the shot, had a temporary loss of appetite, or had redness or tenderness where the shot was given.  About 1 out of 3 had swelling where the shot was given.  About 1 out of 3 had a mild fever, and about 1 in 20 had a fever over 102.2F.  Up to about 8 out of 10 became fussy or irritable.  Adults have reported pain, redness, and swelling where the shot was given; also mild fever, fatigue, headache, chills, or muscle pain. Young children who get PCV13 along with inactivated flu vaccine at the same time may be at increased risk for seizures caused by fever. Ask your doctor for more information. Problems that could happen after any vaccine:  People sometimes faint after a medical procedure, including vaccination. Sitting or lying down for about 15 minutes can help prevent   fainting, and injuries caused by a fall. Tell your doctor if you feel dizzy, or have vision changes or ringing in the ears.  Some older children and adults get severe pain in the shoulder and have difficulty moving the arm where a shot was given. This happens very rarely.  Any medication can cause a  severe allergic reaction. Such reactions from a vaccine are very rare, estimated at about 1 in a million doses, and would happen within a few minutes to a few hours after the vaccination. As with any medicine, there is a very small chance of a vaccine causing a serious injury or death. The safety of vaccines is always being monitored. For more information, visit: www.cdc.gov/vaccinesafety/ 5. What if there is a serious reaction? What should I look for? Look for anything that concerns you, such as signs of a severe allergic reaction, very high fever, or unusual behavior. Signs of a severe allergic reaction can include hives, swelling of the face and throat, difficulty breathing, a fast heartbeat, dizziness, and weakness-usually within a few minutes to a few hours after the vaccination. What should I do?  If you think it is a severe allergic reaction or other emergency that can't wait, call 9-1-1 or get the person to the nearest hospital. Otherwise, call your doctor.  Reactions should be reported to the Vaccine Adverse Event Reporting System (VAERS). Your doctor should file this report, or you can do it yourself through the VAERS web site at www.vaers.hhs.gov, or by calling 1-800-822-7967. ? VAERS does not give medical advice. 6. The National Vaccine Injury Compensation Program The National Vaccine Injury Compensation Program (VICP) is a federal program that was created to compensate people who may have been injured by certain vaccines. Persons who believe they may have been injured by a vaccine can learn about the program and about filing a claim by calling 1-800-338-2382 or visiting the VICP website at www.hrsa.gov/vaccinecompensation. There is a time limit to file a claim for compensation. 7. How can I learn more?  Ask your healthcare provider. He or she can give you the vaccine package insert or suggest other sources of information.  Call your local or state health department.  Contact the  Centers for Disease Control and Prevention (CDC): ? Call 1-800-232-4636 (1-800-CDC-INFO) or ? Visit CDC's website at www.cdc.gov/vaccines Vaccine Information Statement, PCV13 Vaccine (09/30/2014) This information is not intended to replace advice given to you by your health care provider. Make sure you discuss any questions you have with your health care provider. Document Released: 09/09/2006 Document Revised: 08/02/2016 Document Reviewed: 08/02/2016 Elsevier Interactive Patient Education  2017 [employer]  

## 2018-06-19 NOTE — Progress Notes (Signed)
BP 129/75   Pulse 76   Temp 98.4 F (36.9 C) (Oral)   Ht 5' 7.5" (1.715 m)   Wt 225 lb (102.1 kg)   SpO2 95%   BMI 34.72 kg/m    Subjective:    Patient ID: Thomas Huang, male    DOB: Apr 07, 1951, 67 y.o.   MRN: [id number]  HPI: Thomas Huang is a 67 y.o. male  Chief Complaint  Patient presents with  . Diabetes  . Hyperlipidemia  . Hypertension  Patient all in all doing well no complaints diabetes doing well with no low blood sugar spells are excursions. Blood pressure also doing well. BPH symptoms controlled with medications may be a little lightheaded at times. Cholesterol also doing well with no complaints from medications and taking meds faithfully.   Relevant past medical, surgical, family and social history reviewed and updated as indicated. Interim medical history since our last visit reviewed. Allergies and medications reviewed and updated.  Review of Systems  Constitutional: Negative.   Respiratory: Negative.   Cardiovascular: Negative.     Per HPI unless specifically indicated above     Objective:    BP 129/75   Pulse 76   Temp 98.4 F (36.9 C) (Oral)   Ht 5' 7.5" (1.715 m)   Wt 225 lb (102.1 kg)   SpO2 95%   BMI 34.72 kg/m   Wt Readings from Last 3 Encounters:  06/19/18 225 lb (102.1 kg)  12/19/17 227 lb (103 kg)  11/05/17 219 lb (99.3 kg)    Physical Exam  Constitutional: He is oriented to person, place, and time. He appears well-developed and well-nourished.  HENT:  Head: Normocephalic and atraumatic.  Eyes: Conjunctivae and EOM are normal.  Neck: Normal range of motion.  Cardiovascular: Normal rate, regular rhythm and normal heart sounds.  Pulmonary/Chest: Effort normal and breath sounds normal.  Musculoskeletal: Normal range of motion.  Neurological: He is alert and oriented to person, place, and time.  Skin: No erythema.  Psychiatric: He has a normal mood and affect. His behavior is normal. Judgment and thought content normal.     Results for orders placed or performed in visit on 12/19/17  CBC with Differential/Platelet  Result Value Ref Range   WBC 6.0 3.4 - 10.8 x10E3/uL   RBC 4.22 4.14 - 5.80 x10E6/uL   Hemoglobin 12.5 (L) 13.0 - 17.7 g/dL   Hematocrit 39.0 37.5 - 51.0 %   MCV 92 79 - 97 fL   MCH 29.6 26.6 - 33.0 pg   MCHC 32.1 31.5 - 35.7 g/dL   RDW 15.4 12.3 - 15.4 %   Platelets 171 150 - 379 x10E3/uL   Neutrophils 55 Not Estab. %   Lymphs 23 Not Estab. %   Monocytes 12 Not Estab. %   Eos 9 Not Estab. %   Basos 1 Not Estab. %   Neutrophils Absolute 3.3 1.4 - 7.0 x10E3/uL   Lymphocytes Absolute 1.4 0.7 - 3.1 x10E3/uL   Monocytes Absolute 0.7 0.1 - 0.9 x10E3/uL   EOS (ABSOLUTE) 0.6 (H) 0.0 - 0.4 x10E3/uL   Basophils Absolute 0.0 0.0 - 0.2 x10E3/uL   Immature Granulocytes 0 Not Estab. %   Immature Grans (Abs) 0.0 0.0 - 0.1 x10E3/uL  Comprehensive metabolic panel  Result Value Ref Range   Glucose 127 (H) 65 - 99 mg/dL   BUN 24 8 - 27 mg/dL   Creatinine, Ser 2.06 (H) 0.76 - 1.27 mg/dL   GFR calc non Af Amer 33 (  L) >59 mL/min/1.73   GFR calc Af Amer 38 (L) >59 mL/min/1.73   BUN/Creatinine Ratio 12 10 - 24   Sodium 140 134 - 144 mmol/L   Potassium 4.6 3.5 - 5.2 mmol/L   Chloride 103 96 - 106 mmol/L   CO2 23 20 - 29 mmol/L   Calcium 9.6 8.6 - 10.2 mg/dL   Total Protein 7.3 6.0 - 8.5 g/dL   Albumin 4.5 3.6 - 4.8 g/dL   Globulin, Total 2.8 1.5 - 4.5 g/dL   Albumin/Globulin Ratio 1.6 1.2 - 2.2   Bilirubin Total 0.3 0.0 - 1.2 mg/dL   Alkaline Phosphatase 68 39 - 117 IU/L   AST 24 0 - 40 IU/L   ALT 26 0 - 44 IU/L  Lipid panel  Result Value Ref Range   Cholesterol, Total 137 100 - 199 mg/dL   Triglycerides 180 (H) 0 - 149 mg/dL   HDL 33 (L) >39 mg/dL   VLDL Cholesterol Cal 36 5 - 40 mg/dL   LDL Calculated 68 0 - 99 mg/dL   Chol/HDL Ratio 4.2 0.0 - 5.0 ratio  PSA  Result Value Ref Range   Prostate Specific Ag, Serum 0.4 0.0 - 4.0 ng/mL  TSH  Result Value Ref Range   TSH 3.430 0.450 -  4.500 uIU/mL  Urinalysis, Routine w reflex microscopic  Result Value Ref Range   Specific Gravity, UA 1.020 1.005 - 1.030   pH, UA 5.5 5.0 - 7.5   Color, UA Yellow Yellow   Appearance Ur Clear Clear   Leukocytes, UA Negative Negative   Protein, UA Negative Negative/Trace   Glucose, UA Negative Negative   Ketones, UA Negative Negative   RBC, UA Negative Negative   Bilirubin, UA Negative Negative   Urobilinogen, Ur 0.2 0.2 - 1.0 mg/dL   Nitrite, UA Negative Negative  Bayer DCA Hb A1c Waived  Result Value Ref Range   HB A1C (BAYER DCA - WAIVED) 6.5 <7.0 %      Assessment & Plan:   Problem List Items Addressed This Visit      Respiratory   OSA on CPAP    Discuss uses CPAP faithfully for over 20+ years uses every night with resolution of daytime symptoms.  Will need new supplies from time to time.        Endocrine   Diabetes mellitus with renal manifestation ([place]) - Primary    The current medical regimen is effective;  continue present plan and medications.       Relevant Orders   Bayer DCA Hb A1c Waived     Genitourinary   Hypertensive CKD (chronic kidney disease)    The current medical regimen is effective;  continue present plan and medications.         Other   Hyperlipidemia    The current medical regimen is effective;  continue present plan and medications.       Relevant Orders   LP+ALT+AST [place], [place]    Other Visit Diagnoses    Essential hypertension       Relevant Orders   Basic metabolic panel   Need for pneumococcal vaccination       Relevant Orders   Pneumococcal conjugate vaccine 13-valent IM (Completed)       Follow up plan: Return in about 6 months (around 12/20/2018) for Hemoglobin A1c, Physical Exam.

## 2018-06-20 LAB — BASIC METABOLIC PANEL
BUN/Creatinine Ratio: 12 (ref 10–24)
BUN: 24 mg/dL (ref 8–27)
CO2: 20 mmol/L (ref 20–29)
Calcium: 8.9 mg/dL (ref 8.6–10.2)
Chloride: 107 mmol/L — ABNORMAL HIGH (ref 96–106)
Creatinine, Ser: 2.07 mg/dL — ABNORMAL HIGH (ref 0.76–1.27)
GFR calc Af Amer: 38 mL/min/{1.73_m2} — ABNORMAL LOW (ref >59)
GFR calc non Af Amer: 33 mL/min/{1.73_m2} — ABNORMAL LOW (ref >59)
Glucose: 111 mg/dL — ABNORMAL HIGH (ref 65–99)
Potassium: 4.4 mmol/L (ref 3.5–5.2)
Sodium: 142 mmol/L (ref 134–144)

## 2018-06-22 ENCOUNTER — Encounter: Payer: Self-pay | Admitting: Family Medicine

## 2018-08-11 DIAGNOSIS — E559 Vitamin D deficiency, unspecified: Secondary | ICD-10-CM | POA: Diagnosis not present

## 2018-08-11 DIAGNOSIS — Z6836 Body mass index (BMI) 36.0-36.9, adult: Secondary | ICD-10-CM | POA: Diagnosis not present

## 2018-08-11 DIAGNOSIS — I129 Hypertensive chronic kidney disease with stage 1 through stage 4 chronic kidney disease, or unspecified chronic kidney disease: Secondary | ICD-10-CM | POA: Diagnosis not present

## 2018-08-11 DIAGNOSIS — N183 Chronic kidney disease, stage 3 (moderate): Secondary | ICD-10-CM | POA: Diagnosis not present

## 2018-08-11 DIAGNOSIS — E611 Iron deficiency: Secondary | ICD-10-CM | POA: Diagnosis not present

## 2018-08-13 DIAGNOSIS — H10233 Serous conjunctivitis, except viral, bilateral: Secondary | ICD-10-CM | POA: Diagnosis not present

## 2018-08-21 DIAGNOSIS — H10233 Serous conjunctivitis, except viral, bilateral: Secondary | ICD-10-CM | POA: Diagnosis not present

## 2018-09-08 DIAGNOSIS — Z7984 Long term (current) use of oral hypoglycemic drugs: Secondary | ICD-10-CM | POA: Diagnosis not present

## 2018-09-08 DIAGNOSIS — H353121 Nonexudative age-related macular degeneration, left eye, early dry stage: Secondary | ICD-10-CM | POA: Diagnosis not present

## 2018-09-08 DIAGNOSIS — H5203 Hypermetropia, bilateral: Secondary | ICD-10-CM | POA: Diagnosis not present

## 2018-09-08 DIAGNOSIS — H524 Presbyopia: Secondary | ICD-10-CM | POA: Diagnosis not present

## 2018-09-08 DIAGNOSIS — H2513 Age-related nuclear cataract, bilateral: Secondary | ICD-10-CM | POA: Diagnosis not present

## 2018-09-08 DIAGNOSIS — H52223 Regular astigmatism, bilateral: Secondary | ICD-10-CM | POA: Diagnosis not present

## 2018-09-08 DIAGNOSIS — E119 Type 2 diabetes mellitus without complications: Secondary | ICD-10-CM | POA: Diagnosis not present

## 2018-09-08 LAB — HM DIABETES EYE EXAM

## 2018-10-06 ENCOUNTER — Ambulatory Visit (INDEPENDENT_AMBULATORY_CARE_PROVIDER_SITE_OTHER): Payer: Medicare Other

## 2018-10-06 DIAGNOSIS — Z23 Encounter for immunization: Secondary | ICD-10-CM

## 2018-12-25 ENCOUNTER — Ambulatory Visit (INDEPENDENT_AMBULATORY_CARE_PROVIDER_SITE_OTHER): Payer: Medicare Other | Admitting: Family Medicine

## 2018-12-25 ENCOUNTER — Encounter: Payer: Self-pay | Admitting: Family Medicine

## 2018-12-25 VITALS — BP 134/70 | HR 78 | Temp 98.4°F | Ht 67.5 in | Wt 222.1 lb

## 2018-12-25 DIAGNOSIS — G4733 Obstructive sleep apnea (adult) (pediatric): Secondary | ICD-10-CM | POA: Diagnosis not present

## 2018-12-25 DIAGNOSIS — I359 Nonrheumatic aortic valve disorder, unspecified: Secondary | ICD-10-CM | POA: Diagnosis not present

## 2018-12-25 DIAGNOSIS — E0821 Diabetes mellitus due to underlying condition with diabetic nephropathy: Secondary | ICD-10-CM

## 2018-12-25 DIAGNOSIS — R3911 Hesitancy of micturition: Secondary | ICD-10-CM

## 2018-12-25 DIAGNOSIS — N401 Enlarged prostate with lower urinary tract symptoms: Secondary | ICD-10-CM

## 2018-12-25 DIAGNOSIS — N183 Chronic kidney disease, stage 3 unspecified: Secondary | ICD-10-CM

## 2018-12-25 DIAGNOSIS — Z9989 Dependence on other enabling machines and devices: Secondary | ICD-10-CM

## 2018-12-25 DIAGNOSIS — E7800 Pure hypercholesterolemia, unspecified: Secondary | ICD-10-CM | POA: Diagnosis not present

## 2018-12-25 DIAGNOSIS — M961 Postlaminectomy syndrome, not elsewhere classified: Secondary | ICD-10-CM | POA: Diagnosis not present

## 2018-12-25 DIAGNOSIS — I129 Hypertensive chronic kidney disease with stage 1 through stage 4 chronic kidney disease, or unspecified chronic kidney disease: Secondary | ICD-10-CM

## 2018-12-25 DIAGNOSIS — K862 Cyst of pancreas: Secondary | ICD-10-CM

## 2018-12-25 DIAGNOSIS — Z7189 Other specified counseling: Secondary | ICD-10-CM | POA: Diagnosis not present

## 2018-12-25 LAB — URINALYSIS, ROUTINE W REFLEX MICROSCOPIC
Bilirubin, UA: NEGATIVE
Glucose, UA: NEGATIVE
Ketones, UA: NEGATIVE
Leukocytes, UA: NEGATIVE
NITRITE UA: NEGATIVE
Protein, UA: NEGATIVE
Specific Gravity, UA: 1.02 (ref 1.005–1.030)
Urobilinogen, Ur: 0.2 mg/dL (ref 0.2–1.0)
pH, UA: 5 (ref 5.0–7.5)

## 2018-12-25 LAB — BAYER DCA HB A1C WAIVED: HB A1C: 6.8 % (ref <7.0)

## 2018-12-25 LAB — MICROSCOPIC EXAMINATION

## 2018-12-25 MED ORDER — LISINOPRIL 10 MG PO TABS: 10.0000 mg | ORAL_TABLET | Freq: Every day | ORAL | 4 refills | Status: DC

## 2018-12-25 MED ORDER — GLIMEPIRIDE 2 MG PO TABS: 2.0000 mg | ORAL_TABLET | Freq: Every day | ORAL | 4 refills | Status: DC

## 2018-12-25 MED ORDER — OMEPRAZOLE 20 MG PO CPDR: 20.0000 mg | DELAYED_RELEASE_CAPSULE | Freq: Every day | ORAL | 4 refills | Status: DC

## 2018-12-25 MED ORDER — FINASTERIDE 5 MG PO TABS: 5.0000 mg | ORAL_TABLET | Freq: Every day | ORAL | 4 refills | Status: DC

## 2018-12-25 MED ORDER — TAMSULOSIN HCL 0.4 MG PO CAPS: 0.4000 mg | ORAL_CAPSULE | Freq: Every day | ORAL | 4 refills | Status: DC

## 2018-12-25 MED ORDER — ATORVASTATIN CALCIUM 10 MG PO TABS: 10.0000 mg | ORAL_TABLET | Freq: Every day | ORAL | 4 refills | Status: DC

## 2018-12-25 NOTE — Assessment & Plan Note (Signed)
Reviewed murmur the same as described by cardiology 2 years ago will observe and recheck again next year will consider cardiac echo

## 2018-12-25 NOTE — Assessment & Plan Note (Signed)
The current medical regimen is effective;  continue present plan and medications.  

## 2018-12-25 NOTE — Assessment & Plan Note (Signed)
Followed by [facility] nephrology and stable

## 2018-12-25 NOTE — Assessment & Plan Note (Addendum)
The current medical regimen is effective;  continue present plan and medications. Patient with some urgency symptoms discussed acid nature of urine cutting out drinks except for water which will help with weight loss.

## 2018-12-25 NOTE — Assessment & Plan Note (Signed)
A voluntary discussion about advanced care planning including explanation and discussion of advanced directives was extentively discussed with the patient.  Explained about the healthcare proxy and living will was reviewed and packet with forms with expiration of how to fill them out was given.  Time spent: Encounter 16+ min individuals present: Patient 

## 2018-12-25 NOTE — Assessment & Plan Note (Signed)
Sleep apnea symptoms completely resolved on CPAP.  Patient with nightly use and no daytime drowsiness. Continued use of his CPAP is mandatory patient indicates he understands and will comply.

## 2018-12-25 NOTE — Assessment & Plan Note (Signed)
Ongoing chronic back pain is possibly scheduled another surgery later this year

## 2018-12-25 NOTE — Assessment & Plan Note (Signed)
Followed at [facility] and stable

## 2018-12-25 NOTE — Progress Notes (Addendum)
BP 134/70 (BP Location: Left Arm, Patient Position: Sitting, Cuff Size: Large)   Pulse 78   Temp 98.4 F (36.9 C)   Ht 5' 7.5" (1.715 m)   Wt 222 lb 1 oz (100.7 kg)   SpO2 96%   BMI 34.27 kg/m    Subjective:    Patient ID: Thomas Huang, male    DOB: [birth year], 68 y.o.   MRN: [id number]  HPI: Thomas Huang is a 68 y.o. male  Chief Complaint  Patient presents with  . Diabetes  Patient follow-up CPAP has been doing well with using his CPAP.  Has been using his CPAP for over 20 years with nightly use and good resolution of sleep symptoms.  Patient with no daytime drowsiness or problems.  No reported snoring no issues with fit.  Patient is also noticed some urgency during the daytime with urination.  Maybe some slight burning no problems at night is able to sleep all night. BPH symptoms with tamsulosin and finasteride seem to be controlled with good urinary flow.  Diabetes only taking glimepiride without problems no low blood sugar spells. Blood pressure cholesterol doing well with medications no complaints.  Relevant past medical, surgical, family and social history reviewed and updated as indicated. Interim medical history since our last visit reviewed. Allergies and medications reviewed and updated.  Review of Systems  Constitutional: Negative.   HENT: Negative.   Eyes: Negative.   Respiratory: Negative.   Cardiovascular: Negative.   Gastrointestinal: Negative.   Endocrine: Negative.   Genitourinary: Negative.   Musculoskeletal: Negative.   Skin: Negative.   Allergic/Immunologic: Negative.   Neurological: Negative.   Hematological: Negative.   Psychiatric/Behavioral: Negative.     Per HPI unless specifically indicated above     Objective:    BP 134/70 (BP Location: Left Arm, Patient Position: Sitting, Cuff Size: Large)   Pulse 78   Temp 98.4 F (36.9 C)   Ht 5' 7.5" (1.715 m)   Wt 222 lb 1 oz (100.7 kg)   SpO2 96%   BMI 34.27 kg/m   Wt Readings from  Last 3 Encounters:  12/25/18 222 lb 1 oz (100.7 kg)  06/19/18 225 lb (102.1 kg)  12/19/17 227 lb (103 kg)    Physical Exam Constitutional:      Appearance: He is well-developed.  HENT:     Head: Normocephalic and atraumatic.     Right Ear: External ear normal.     Left Ear: External ear normal.  Eyes:     Conjunctiva/sclera: Conjunctivae normal.     Pupils: Pupils are equal, round, and reactive to light.  Neck:     Musculoskeletal: Normal range of motion and neck supple.  Cardiovascular:     Rate and Rhythm: Normal rate and regular rhythm.     Heart sounds: Murmur present.     Comments: 3/6 SM arotic area Pulmonary:     Effort: Pulmonary effort is normal.     Breath sounds: Normal breath sounds.  Abdominal:     General: Bowel sounds are normal.     Palpations: Abdomen is soft. There is no hepatomegaly or splenomegaly.  Genitourinary:    Penis: Normal.      Rectum: Normal.     Comments: BPH changes Musculoskeletal: Normal range of motion.  Skin:    Findings: No erythema or rash.  Neurological:     Mental Status: He is alert and oriented to person, place, and time.     Deep Tendon Reflexes: Reflexes  are normal and symmetric.  Psychiatric:        Behavior: Behavior normal.        Thought Content: Thought content normal.        Judgment: Judgment normal.     Results for orders placed or performed in visit on 09/12/18  HM DIABETES EYE EXAM  Result Value Ref Range   HM Diabetic Eye Exam No Retinopathy No Retinopathy      Assessment & Plan:   Problem List Items Addressed This Visit      Cardiovascular and Mediastinum   Aortic valve calcification    Reviewed murmur the same as described by cardiology 2 years ago will observe and recheck again next year will consider cardiac echo      Relevant Medications   lisinopril (PRINIVIL,ZESTRIL) 10 MG tablet   atorvastatin (LIPITOR) 10 MG tablet     Respiratory   OSA on CPAP    Sleep apnea symptoms completely resolved  on CPAP.  Patient with nightly use and no daytime drowsiness. Continued use of his CPAP is mandatory patient indicates he understands and will comply.        Digestive   Pancreatic cyst    Followed at [facility] and stable        Endocrine   Diabetes mellitus with renal manifestation ([place]) - Primary    The current medical regimen is effective;  continue present plan and medications.       Relevant Medications   lisinopril (PRINIVIL,ZESTRIL) 10 MG tablet   atorvastatin (LIPITOR) 10 MG tablet   glimepiride (AMARYL) 2 MG tablet   Other Relevant Orders   Bayer DCA Hb A1c Waived   Comprehensive metabolic panel   CBC with Differential/Platelet   TSH     Genitourinary   Hypertensive CKD (chronic kidney disease)   Relevant Medications   lisinopril (PRINIVIL,ZESTRIL) 10 MG tablet   CKD (chronic kidney disease), stage III (H[facility])    Followed by [facility] nephrology and stable      Relevant Orders   TSH   Urinalysis, Routine w reflex microscopic   BPH (benign prostatic hyperplasia)    The current medical regimen is effective;  continue present plan and medications. Patient with some urgency symptoms discussed acid nature of urine cutting out drinks except for water which will help with weight loss.      Relevant Medications   tamsulosin (FLOMAX) 0.4 MG CAPS capsule   finasteride (PROSCAR) 5 MG tablet   Other Relevant Orders   PSA     Other   Hyperlipidemia    The current medical regimen is effective;  continue present plan and medications.       Relevant Medications   lisinopril (PRINIVIL,ZESTRIL) 10 MG tablet   atorvastatin (LIPITOR) 10 MG tablet   Other Relevant Orders   Lipid panel   TSH   Failed back surgical syndrome    Ongoing chronic back pain is possibly scheduled another surgery later this year      Advanced care planning/counseling discussion    A voluntary discussion about advanced care planning including explanation and discussion of advanced directives was  extentively discussed with the patient.  Explained about the healthcare proxy and living will was reviewed and packet with forms with expiration of how to fill them out was given.  Time spent: Encounter 16+ min individuals present: Patient          Follow up plan: Return in about 6 months (around 06/25/2019) for Hemoglobin A1c, BMP,  Lipids, ALT,  AST.

## 2018-12-26 ENCOUNTER — Encounter: Payer: Self-pay | Admitting: Family Medicine

## 2018-12-26 LAB — CBC WITH DIFFERENTIAL/PLATELET
BASOS ABS: 0 10*3/uL (ref 0.0–0.2)
BASOS: 1 %
EOS (ABSOLUTE): 0.3 10*3/uL (ref 0.0–0.4)
Eos: 6 %
HEMOGLOBIN: 12.5 g/dL — AB (ref 13.0–17.7)
Hematocrit: 37.6 % (ref 37.5–51.0)
IMMATURE GRANS (ABS): 0 10*3/uL (ref 0.0–0.1)
Immature Granulocytes: 0 %
LYMPHS: 20 %
Lymphocytes Absolute: 1.2 10*3/uL (ref 0.7–3.1)
MCH: 30.6 pg (ref 26.6–33.0)
MCHC: 33.2 g/dL (ref 31.5–35.7)
MCV: 92 fL (ref 79–97)
Monocytes Absolute: 0.5 10*3/uL (ref 0.1–0.9)
Monocytes: 8 %
NEUTROS PCT: 65 %
Neutrophils Absolute: 4.1 10*3/uL (ref 1.4–7.0)
PLATELETS: 155 10*3/uL (ref 150–450)
RBC: 4.08 x10E6/uL — AB (ref 4.14–5.80)
RDW: 14.2 % (ref 11.6–15.4)
WBC: 6.2 10*3/uL (ref 3.4–10.8)

## 2018-12-26 LAB — COMPREHENSIVE METABOLIC PANEL
ALBUMIN: 4.5 g/dL (ref 3.8–4.8)
ALK PHOS: 68 IU/L (ref 39–117)
ALT: 27 IU/L (ref 0–44)
AST: 21 IU/L (ref 0–40)
Albumin/Globulin Ratio: 1.8 (ref 1.2–2.2)
BUN / CREAT RATIO: 14 (ref 10–24)
BUN: 25 mg/dL (ref 8–27)
Bilirubin Total: 0.4 mg/dL (ref 0.0–1.2)
CO2: 21 mmol/L (ref 20–29)
CREATININE: 1.79 mg/dL — AB (ref 0.76–1.27)
Calcium: 9.4 mg/dL (ref 8.6–10.2)
Chloride: 105 mmol/L (ref 96–106)
GFR calc non Af Amer: 39 mL/min/{1.73_m2} — ABNORMAL LOW (ref >59)
GFR, EST AFRICAN AMERICAN: 45 mL/min/{1.73_m2} — AB (ref >59)
GLOBULIN, TOTAL: 2.5 g/dL (ref 1.5–4.5)
Glucose: 121 mg/dL — ABNORMAL HIGH (ref 65–99)
Potassium: 4.5 mmol/L (ref 3.5–5.2)
SODIUM: 142 mmol/L (ref 134–144)
Total Protein: 7 g/dL (ref 6.0–8.5)

## 2018-12-26 LAB — PSA: Prostate Specific Ag, Serum: 0.3 ng/mL (ref 0.0–4.0)

## 2018-12-26 LAB — LIPID PANEL
CHOLESTEROL TOTAL: 127 mg/dL (ref 100–199)
Chol/HDL Ratio: 3.8 ratio (ref 0.0–5.0)
HDL: 33 mg/dL — ABNORMAL LOW (ref >39)
LDL CALC: 68 mg/dL (ref 0–99)
Triglycerides: 132 mg/dL (ref 0–149)
VLDL Cholesterol Cal: 26 mg/dL (ref 5–40)

## 2018-12-26 LAB — TSH: TSH: 2.94 u[IU]/mL (ref 0.450–4.500)

## 2019-05-22 ENCOUNTER — Encounter: Payer: Self-pay | Admitting: Family Medicine

## 2019-06-01 ENCOUNTER — Encounter: Payer: Self-pay | Admitting: Family Medicine

## 2019-06-01 ENCOUNTER — Ambulatory Visit (INDEPENDENT_AMBULATORY_CARE_PROVIDER_SITE_OTHER): Payer: Medicare Other | Admitting: Family Medicine

## 2019-06-01 ENCOUNTER — Ambulatory Visit: Payer: Self-pay | Admitting: Family Medicine

## 2019-06-01 ENCOUNTER — Other Ambulatory Visit: Payer: Self-pay

## 2019-06-01 VITALS — Ht 66.0 in | Wt 225.0 lb

## 2019-06-01 DIAGNOSIS — R1032 Left lower quadrant pain: Secondary | ICD-10-CM | POA: Diagnosis not present

## 2019-06-01 MED ORDER — METRONIDAZOLE 500 MG PO TABS: 500.0000 mg | ORAL_TABLET | Freq: Three times a day (TID) | ORAL | 0 refills | Status: DC

## 2019-06-01 MED ORDER — CIPROFLOXACIN HCL 500 MG PO TABS: 500.0000 mg | ORAL_TABLET | Freq: Two times a day (BID) | ORAL | 0 refills | Status: DC

## 2019-06-01 NOTE — Progress Notes (Signed)
Ht 5\' 6"  (1.676 m)   Wt 225 lb (102.1 kg)   BMI 36.32 kg/m    Subjective:    Patient ID: Thomas Huang, male    DOB: 1951-10-11, 68 y.o.   MRN: [id number]  HPI: Thomas Huang is a 68 y.o. male  Chief Complaint  Patient presents with  . Abdominal Pain    LLQ abdominal pain. Pain keeps increasing. Patient states yesterday he could not put any pressure on the area or it'd be severe. Patient state Hx of Diverticulitis.     . This visit was completed via telephone due to the restrictions of the COVID-19 pandemic. All issues as above were discussed and addressed. Physical exam was done as above through visual confirmation on telephone. If it was felt that the patient should be evaluated in the office, they were directed there. The patient verbally consented to this visit. . Location of the patient: home . Location of the provider: home . Those involved with this call:  . Provider: Merrie Roof, PA-C . CMA: Merilyn Baba, [place] . Front Desk/Registration: Jill Side  . Time spent on call: 15 minutes with patient face to face via video conference. More than 50% of this time was spent in counseling and coordination of care. 5 minutes total spent in review of patient's record and preparation of their chart. I verified patient identity using two factors (patient name and date of birth). Patient consents verbally to being seen via telemedicine visit today.   Started 4 days ago with LLQ pain that was mild at first and worsens each day. Now is severely painful when he presses in that area since yesterday. Pain has eased up a bit today. Has not eaten anything today at all, that seems to make it worse. Has had diverticulitis twice in the past and states this feels consistent. Denies fevers, chills, N/V/D, constipation, melena, sick contacts, urinary sxs. Taking OTC pain relievers with mild temporary relief.   Relevant past medical, surgical, family and social history reviewed and updated as indicated.  Interim medical history since our last visit reviewed. Allergies and medications reviewed and updated.  Review of Systems  Per HPI unless specifically indicated above     Objective:    Ht 5\' 6"  (1.676 m)   Wt 225 lb (102.1 kg)   BMI 36.32 kg/m   Wt Readings from Last 3 Encounters:  06/01/19 225 lb (102.1 kg)  12/25/18 222 lb 1 oz (100.7 kg)  06/19/18 225 lb (102.1 kg)    Physical Exam  Unable to perform PE today as he did not have access to video technology for today's visit.   Results for orders placed or performed in visit on 12/25/18  Microscopic Examination   BLD  Result Value Ref Range   Epithelial Cells (non renal) CANCELED   Bayer DCA Hb A1c Waived  Result Value Ref Range   HB A1C (BAYER DCA - WAIVED) 6.8 <7.0 %  Comprehensive metabolic panel  Result Value Ref Range   Glucose 121 (H) 65 - 99 mg/dL   BUN 25 8 - 27 mg/dL   Creatinine, Ser 1.79 (H) 0.76 - 1.27 mg/dL   GFR calc non Af Amer 39 (L) >59 mL/min/1.73   GFR calc Af Amer 45 (L) >59 mL/min/1.73   BUN/Creatinine Ratio 14 10 - 24   Sodium 142 134 - 144 mmol/L   Potassium 4.5 3.5 - 5.2 mmol/L   Chloride 105 96 - 106 mmol/L   CO2 21 20 -  29 mmol/L   Calcium 9.4 8.6 - 10.2 mg/dL   Total Protein 7.0 6.0 - 8.5 g/dL   Albumin 4.5 3.8 - 4.8 g/dL   Globulin, Total 2.5 1.5 - 4.5 g/dL   Albumin/Globulin Ratio 1.8 1.2 - 2.2   Bilirubin Total 0.4 0.0 - 1.2 mg/dL   Alkaline Phosphatase 68 39 - 117 IU/L   AST 21 0 - 40 IU/L   ALT 27 0 - 44 IU/L  Lipid panel  Result Value Ref Range   Cholesterol, Total 127 100 - 199 mg/dL   Triglycerides 132 0 - 149 mg/dL   HDL 33 (L) >39 mg/dL   VLDL Cholesterol Cal 26 5 - 40 mg/dL   LDL Calculated 68 0 - 99 mg/dL   Chol/HDL Ratio 3.8 0.0 - 5.0 ratio  CBC with Differential/Platelet  Result Value Ref Range   WBC 6.2 3.4 - 10.8 x10E3/uL   RBC 4.08 (L) 4.14 - 5.80 x10E6/uL   Hemoglobin 12.5 (L) 13.0 - 17.7 g/dL   Hematocrit 37.6 37.5 - 51.0 %   MCV 92 79 - 97 fL   MCH 30.6  26.6 - 33.0 pg   MCHC 33.2 31.5 - 35.7 g/dL   RDW 14.2 11.6 - 15.4 %   Platelets 155 150 - 450 x10E3/uL   Neutrophils 65 Not Estab. %   Lymphs 20 Not Estab. %   Monocytes 8 Not Estab. %   Eos 6 Not Estab. %   Basos 1 Not Estab. %   Neutrophils Absolute 4.1 1.4 - 7.0 x10E3/uL   Lymphocytes Absolute 1.2 0.7 - 3.1 x10E3/uL   Monocytes Absolute 0.5 0.1 - 0.9 x10E3/uL   EOS (ABSOLUTE) 0.3 0.0 - 0.4 x10E3/uL   Basophils Absolute 0.0 0.0 - 0.2 x10E3/uL   Immature Granulocytes 0 Not Estab. %   Immature Grans (Abs) 0.0 0.0 - 0.1 x10E3/uL  TSH  Result Value Ref Range   TSH 2.940 0.450 - 4.500 uIU/mL  Urinalysis, Routine w reflex microscopic  Result Value Ref Range   Specific Gravity, UA 1.020 1.005 - 1.030   pH, UA 5.0 5.0 - 7.5   Color, UA Yellow Yellow   Appearance Ur Clear Clear   Leukocytes, UA Negative Negative   Protein, UA Negative Negative/Trace   Glucose, UA Negative Negative   Ketones, UA Negative Negative   RBC, UA Trace (A) Negative   Bilirubin, UA Negative Negative   Urobilinogen, Ur 0.2 0.2 - 1.0 mg/dL   Nitrite, UA Negative Negative   Microscopic Examination See below:   PSA  Result Value Ref Range   Prostate Specific Ag, Serum 0.3 0.0 - 4.0 ng/mL      Assessment & Plan:   Problem List Items Addressed This Visit    None    Visit Diagnoses    LLQ pain    -  Primary    Concern for diverticulitis, particularly given his hx. Refuses CT scan or in person eval w/ labs d/t COVID concerns. Start cipro flagyl and bland diet and monitor closely for benefit. Strict ER precautions for persistent or worsening sxs given, pt agreeable to this plan.    Follow up plan: Return if symptoms worsen or fail to improve.

## 2019-06-01 NOTE — Telephone Encounter (Signed)
Pt. Reports he started having abdominal pain Sat. Hurts at the belly button line and to the left. Constant now. Hurts worse when he eats. No fever. Had diverticulitis in the past. Warm transfer to [facility] in the practice. Answer Assessment - Initial Assessment Questions 1. LOCATION: "Where does it hurt?"      Left side to the left 2. RADIATION: "Does the pain shoot anywhere else?" (e.g., chest, back)     No 3. ONSET: "When did the pain begin?" (Minutes, hours or days ago)      Started Sat. morning 4. SUDDEN: "Gradual or sudden onset?"     Gradual 5. PATTERN "Does the pain come and go, or is it constant?"    - If constant: "Is it getting better, staying the same, or worsening?"      (Note: Constant means the pain never goes away completely; most serious pain is constant and it progresses)     - If intermittent: "How long does it last?" "Do you have pain now?"     (Note: Intermittent means the pain goes away completely between bouts)     Constant 6. SEVERITY: "How bad is the pain?"  (e.g., Scale 1-10; mild, moderate, or severe)    - MILD (1-3): doesn't interfere with normal activities, abdomen soft and not tender to touch     - MODERATE (4-7): interferes with normal activities or awakens from sleep, tender to touch     - SEVERE (8-10): excruciating pain, doubled over, unable to do any normal activities       6 7. RECURRENT SYMPTOM: "Have you ever had this type of abdominal pain before?" If so, ask: "When was the last time?" and "What happened that time?"      Yes 8. CAUSE: "What do you think is causing the abdominal pain?"     Diverticulitis 9. RELIEVING/AGGRAVATING FACTORS: "What makes it better or worse?" (e.g., movement, antacids, bowel movement)     Eating makes it worse 10. OTHER SYMPTOMS: "Has there been any vomiting, diarrhea, constipation, or urine problems?"       No  Protocols used: ABDOMINAL PAIN - MALE-A-AH

## 2019-06-17 DIAGNOSIS — D378 Neoplasm of uncertain behavior of other specified digestive organs: Secondary | ICD-10-CM | POA: Diagnosis not present

## 2019-06-17 DIAGNOSIS — D379 Neoplasm of uncertain behavior of digestive organ, unspecified: Secondary | ICD-10-CM | POA: Diagnosis not present

## 2019-06-17 DIAGNOSIS — Z87442 Personal history of urinary calculi: Secondary | ICD-10-CM | POA: Diagnosis not present

## 2019-06-17 DIAGNOSIS — K862 Cyst of pancreas: Secondary | ICD-10-CM | POA: Diagnosis not present

## 2019-06-17 DIAGNOSIS — K7689 Other specified diseases of liver: Secondary | ICD-10-CM | POA: Diagnosis not present

## 2019-06-17 DIAGNOSIS — Z86004 Personal history of in-situ neoplasm of other and unspecified digestive organs: Secondary | ICD-10-CM | POA: Diagnosis not present

## 2019-06-17 DIAGNOSIS — N200 Calculus of kidney: Secondary | ICD-10-CM | POA: Diagnosis not present

## 2019-06-21 IMAGING — CT CT MAXILLOFACIAL W/O CM
3 of 5 series · 16 of 47 positions shown, 19 images · non-contrast
Comparison: None.

CLINICAL DATA: Fall with right nose injury.  Initial encounter.

EXAM:
CT MAXILLOFACIAL WITHOUT CONTRAST
TECHNIQUE: Multidetector CT imaging of the maxillofacial structures was
performed. Multiplanar CT image reconstructions were also generated.

[Series 2: max soft · axial · 0.38mm/px · z∈[+165,+323]mm · 11 of 93 slices shown, 14 images]
[im 7/93  brain]
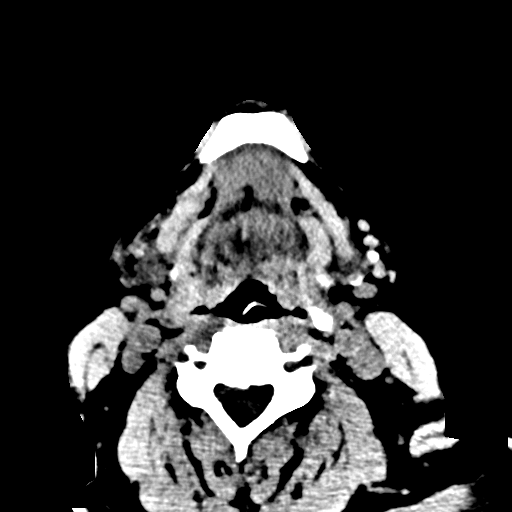
[im 7/93  bone]
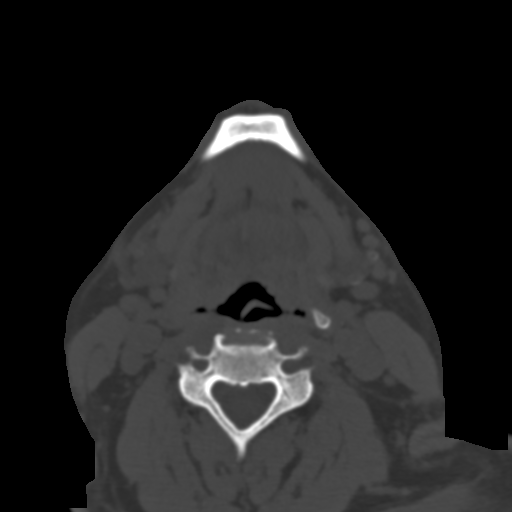
[im 13/93  bone]
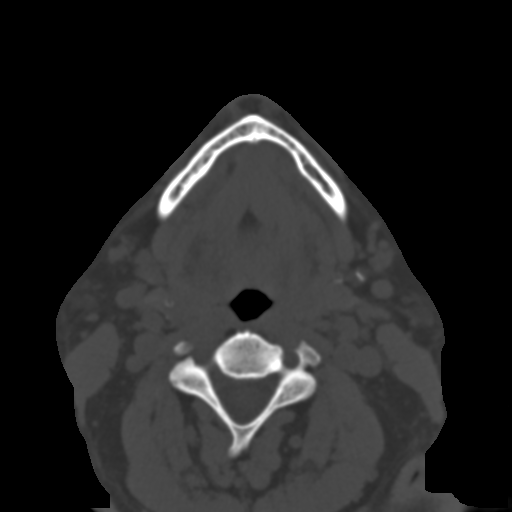
[im 23/93  bone]
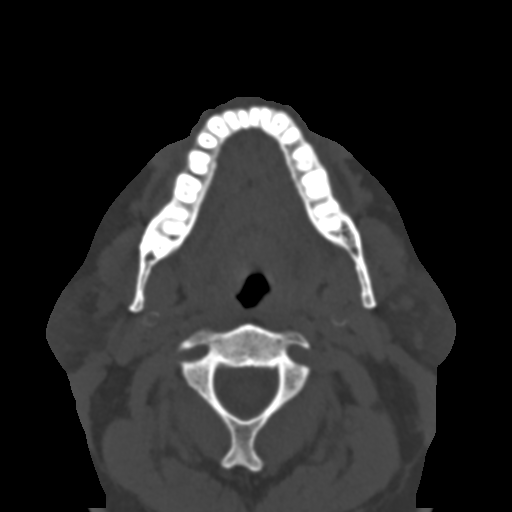
[im 29/93  bone]
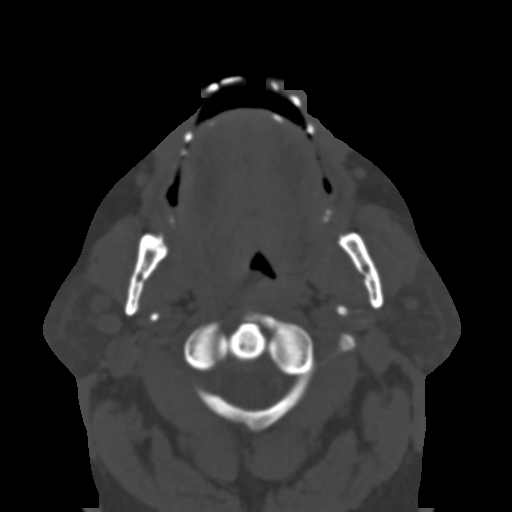
[im 39/93  brain]
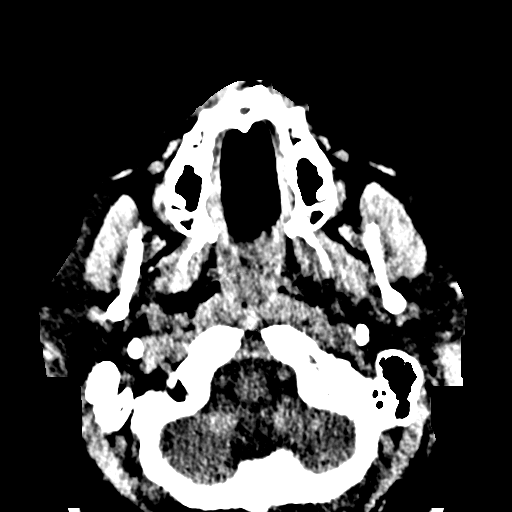
[im 39/93  bone]
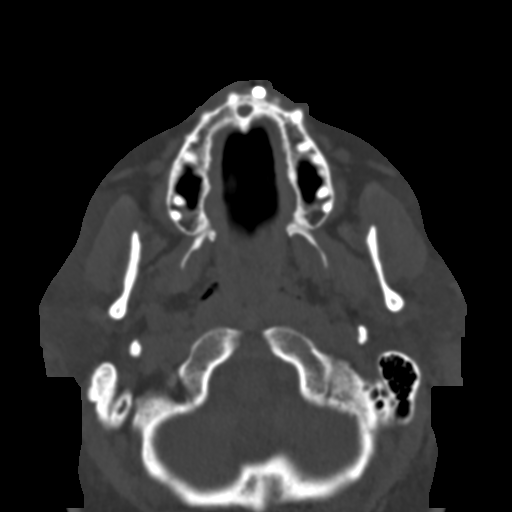
[im 48/93  bone]
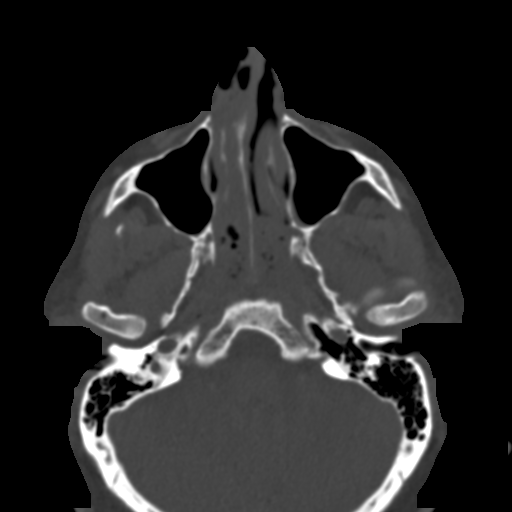
[im 54/93  bone]
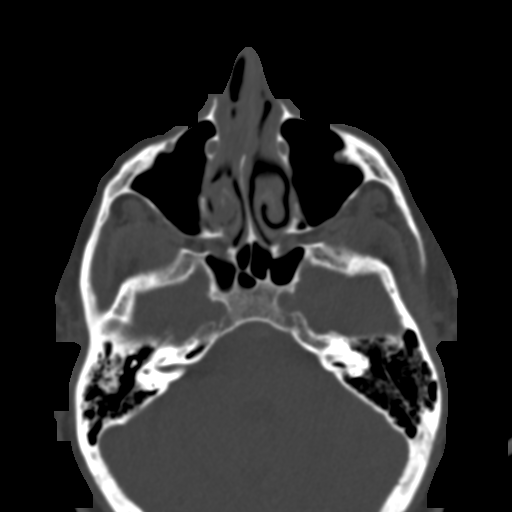
[im 64/93  bone]
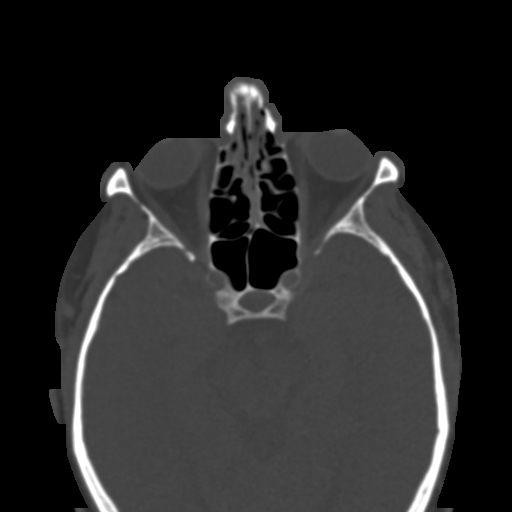
[im 70/93  brain]
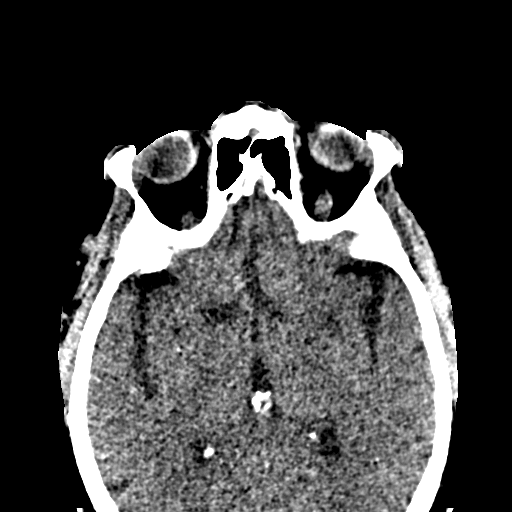
[im 70/93  bone]
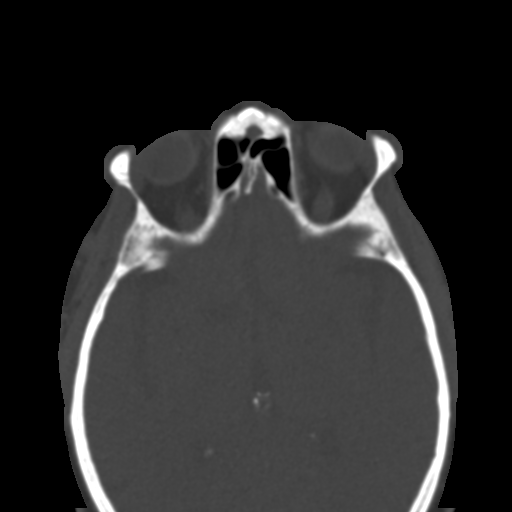
[im 80/93  bone]
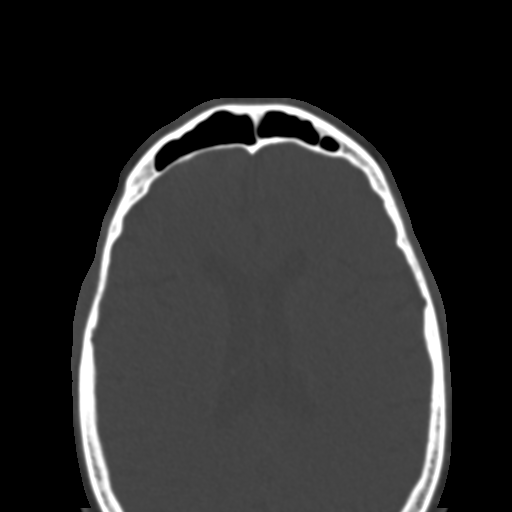
[im 86/93  bone]
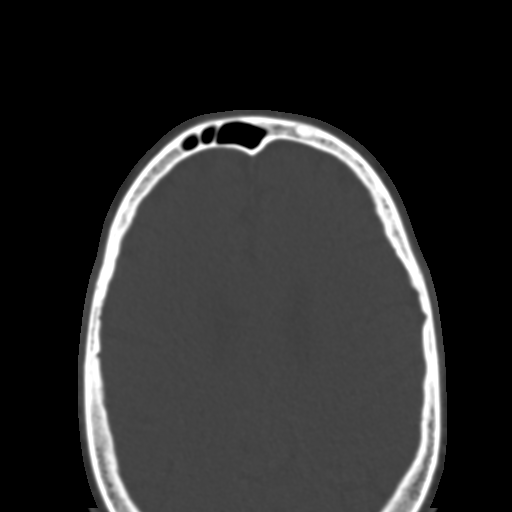

[Series 8: coronal bone · coronal · 0.37mm/px · 3 of 81 slices shown]
[im 21/81  bone]
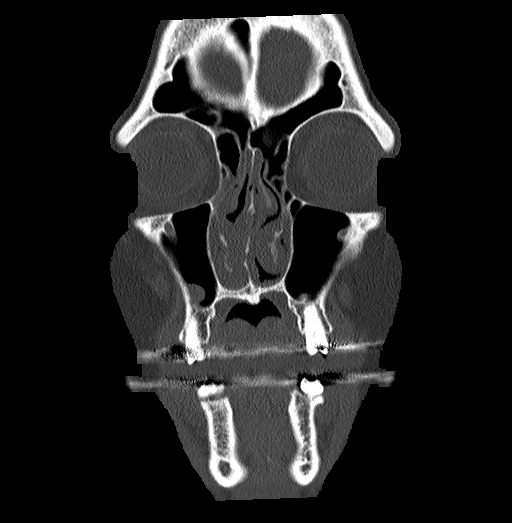
[im 41/81  bone]
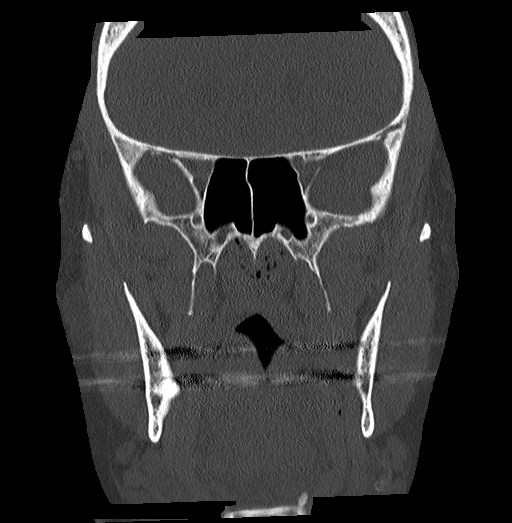
[im 61/81  bone]
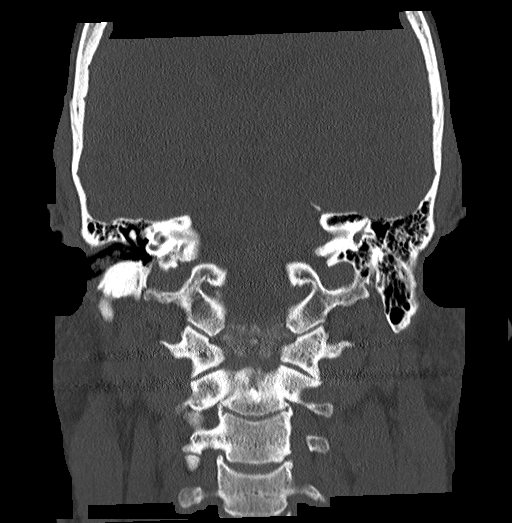

[Series 9: sagittal bone · sagittal · 0.38mm/px · 2 of 88 slices shown]
[im 30/88  bone]
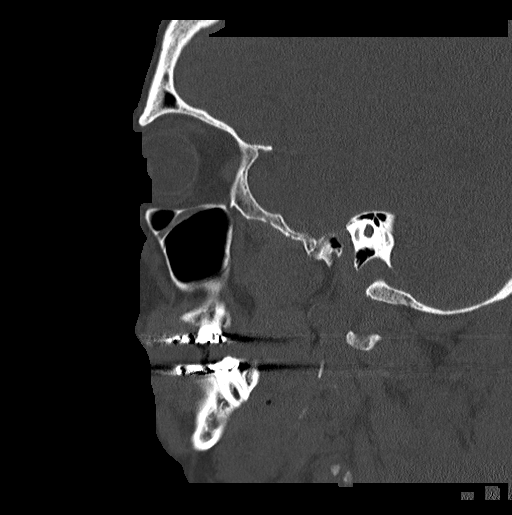
[im 59/88  bone]
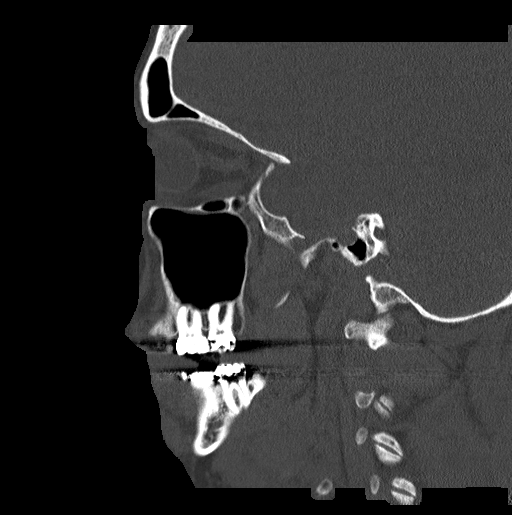

[16 of 47 positions shown; findings below may reference images not displayed]

FINDINGS: Osseous: Comminuted bilateral nasal arch. There is medial
displacement at the level of the anterior process left maxilla. The
nasal septum is fractured at the bony cartilaginous junction with
septal soft tissue gas. Septum is deviated to the right on a chronic
basis based on rightward spurring. No orbital or ethmoid
continuation.

Orbits: No evidence of globe injury or postseptal hematoma.

Sinuses: No acute finding.

Soft tissues: Soft tissue swelling over the nasal bridge. Punctate
density along the skin surface of the left nasal arch the could be
calcification or debris.

Material in the bilateral nasal cavity that is likely hemorrhage.

Prominent arterial calcifications.

Limited intracranial: No evidence of injury.
IMPRESSION: 1. Comminuted bilateral nasal arch fractures with mild depression on
the left.
2. Nasal septum fracture. The septum is deviated and spurred towards
the right.

## 2019-06-25 ENCOUNTER — Encounter: Payer: Self-pay | Admitting: Family Medicine

## 2019-06-25 ENCOUNTER — Other Ambulatory Visit: Payer: Self-pay

## 2019-06-25 ENCOUNTER — Ambulatory Visit (INDEPENDENT_AMBULATORY_CARE_PROVIDER_SITE_OTHER): Payer: Medicare Other | Admitting: Family Medicine

## 2019-06-25 DIAGNOSIS — E0821 Diabetes mellitus due to underlying condition with diabetic nephropathy: Secondary | ICD-10-CM | POA: Diagnosis not present

## 2019-06-25 DIAGNOSIS — G4733 Obstructive sleep apnea (adult) (pediatric): Secondary | ICD-10-CM | POA: Diagnosis not present

## 2019-06-25 DIAGNOSIS — E7800 Pure hypercholesterolemia, unspecified: Secondary | ICD-10-CM | POA: Diagnosis not present

## 2019-06-25 DIAGNOSIS — Z9989 Dependence on other enabling machines and devices: Secondary | ICD-10-CM | POA: Diagnosis not present

## 2019-06-25 DIAGNOSIS — N183 Chronic kidney disease, stage 3 unspecified: Secondary | ICD-10-CM

## 2019-06-25 DIAGNOSIS — I129 Hypertensive chronic kidney disease with stage 1 through stage 4 chronic kidney disease, or unspecified chronic kidney disease: Secondary | ICD-10-CM | POA: Diagnosis not present

## 2019-06-25 NOTE — Progress Notes (Signed)
BP 106/67   Wt 214 lb (97.1 kg)   BMI 34.54 kg/m    Subjective:    Patient ID: Thomas Huang, male    DOB: [birth year], 68 y.o.   MRN: [id number]  HPI: Thomas Huang is a 68 y.o. male  Med check Patient recheck CPAP has been doing well no complaints has used CPAP faithfully for 20+ years without any problems.  Has good resolution of daytime drowsiness with no daytime symptoms. Patient also follow-up other medical problems hypertension doing well no complaints good readings. Cholesterol also stable. Has follow-up with nephrology in September and doing well with no complaints.  Relevant past medical, surgical, family and social history reviewed and updated as indicated. Interim medical history since our last visit reviewed. Allergies and medications reviewed and updated.  Review of Systems  Constitutional: Negative.   Respiratory: Negative.   Cardiovascular: Negative.     Per HPI unless specifically indicated above     Objective:    BP 106/67   Wt 214 lb (97.1 kg)   BMI 34.54 kg/m   Wt Readings from Last 3 Encounters:  06/25/19 214 lb (97.1 kg)  06/01/19 225 lb (102.1 kg)  12/25/18 222 lb 1 oz (100.7 kg)    Physical Exam  Results for orders placed or performed in visit on 12/25/18  Microscopic Examination   BLD  Result Value Ref Range   Epithelial Cells (non renal) CANCELED   Bayer DCA Hb A1c Waived  Result Value Ref Range   HB A1C (BAYER DCA - WAIVED) 6.8 <7.0 %  Comprehensive metabolic panel  Result Value Ref Range   Glucose 121 (H) 65 - 99 mg/dL   BUN 25 8 - 27 mg/dL   Creatinine, Ser 1.79 (H) 0.76 - 1.27 mg/dL   GFR calc non Af Amer 39 (L) >59 mL/min/1.73   GFR calc Af Amer 45 (L) >59 mL/min/1.73   BUN/Creatinine Ratio 14 10 - 24   Sodium 142 134 - 144 mmol/L   Potassium 4.5 3.5 - 5.2 mmol/L   Chloride 105 96 - 106 mmol/L   CO2 21 20 - 29 mmol/L   Calcium 9.4 8.6 - 10.2 mg/dL   Total Protein 7.0 6.0 - 8.5 g/dL   Albumin 4.5 3.8 - 4.8 g/dL   Globulin, Total 2.5 1.5 - 4.5 g/dL   Albumin/Globulin Ratio 1.8 1.2 - 2.2   Bilirubin Total 0.4 0.0 - 1.2 mg/dL   Alkaline Phosphatase 68 39 - 117 IU/L   AST 21 0 - 40 IU/L   ALT 27 0 - 44 IU/L  Lipid panel  Result Value Ref Range   Cholesterol, Total 127 100 - 199 mg/dL   Triglycerides 132 0 - 149 mg/dL   HDL 33 (L) >39 mg/dL   VLDL Cholesterol Cal 26 5 - 40 mg/dL   LDL Calculated 68 0 - 99 mg/dL   Chol/HDL Ratio 3.8 0.0 - 5.0 ratio  CBC with Differential/Platelet  Result Value Ref Range   WBC 6.2 3.4 - 10.8 x10E3/uL   RBC 4.08 (L) 4.14 - 5.80 x10E6/uL   Hemoglobin 12.5 (L) 13.0 - 17.7 g/dL   Hematocrit 37.6 37.5 - 51.0 %   MCV 92 79 - 97 fL   MCH 30.6 26.6 - 33.0 pg   MCHC 33.2 31.5 - 35.7 g/dL   RDW 14.2 11.6 - 15.4 %   Platelets 155 150 - 450 x10E3/uL   Neutrophils 65 Not Estab. %   Lymphs 20 Not Estab. %  Monocytes 8 Not Estab. %   Eos 6 Not Estab. %   Basos 1 Not Estab. %   Neutrophils Absolute 4.1 1.4 - 7.0 x10E3/uL   Lymphocytes Absolute 1.2 0.7 - 3.1 x10E3/uL   Monocytes Absolute 0.5 0.1 - 0.9 x10E3/uL   EOS (ABSOLUTE) 0.3 0.0 - 0.4 x10E3/uL   Basophils Absolute 0.0 0.0 - 0.2 x10E3/uL   Immature Granulocytes 0 Not Estab. %   Immature Grans (Abs) 0.0 0.0 - 0.1 x10E3/uL  TSH  Result Value Ref Range   TSH 2.940 0.450 - 4.500 uIU/mL  Urinalysis, Routine w reflex microscopic  Result Value Ref Range   Specific Gravity, UA 1.020 1.005 - 1.030   pH, UA 5.0 5.0 - 7.5   Color, UA Yellow Yellow   Appearance Ur Clear Clear   Leukocytes, UA Negative Negative   Protein, UA Negative Negative/Trace   Glucose, UA Negative Negative   Ketones, UA Negative Negative   RBC, UA Trace (A) Negative   Bilirubin, UA Negative Negative   Urobilinogen, Ur 0.2 0.2 - 1.0 mg/dL   Nitrite, UA Negative Negative   Microscopic Examination See below:   PSA  Result Value Ref Range   Prostate Specific Ag, Serum 0.3 0.0 - 4.0 ng/mL      Assessment & Plan:   Problem List Items Addressed  This Visit      Respiratory   OSA on CPAP    Faithful use of CPAP equipment every night with good resolution of daytime symptoms and no daytime drowsiness or issues. It is necessary for this patient to continue use of his CPAP equipment.        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       Relevant Orders   Comprehensive metabolic panel   CBC with Differential/Platelet   Bayer DCA Hb A1c Waived     Genitourinary   Hypertensive CKD (chronic kidney disease)    The current medical regimen is effective;  continue present plan and medications.       Relevant Orders   Comprehensive metabolic panel   CBC with Differential/Platelet   CKD (chronic kidney disease), stage III ([place])    The current medical regimen is effective;  continue present plan and medications.         Other   Hyperlipidemia    The current medical regimen is effective;  continue present plan and medications.       Relevant Orders   Lipid panel      Telemedicine using audio/video telecommunications for a synchronous communication visit. Today's visit due to COVID-19 isolation precautions I connected with and verified that I am speaking with the correct person using two identifiers.   I discussed the limitations, risks, security and privacy concerns of performing an evaluation and management service by telecommunication and the availability of in person appointments. I also discussed with the patient that there may be a patient responsible charge related to this service. The patient expressed understanding and agreed to proceed. The patient's location is home. I am at home.   I discussed the assessment and treatment plan with the patient. The patient was provided an opportunity to ask questions and all were answered. The patient agreed with the plan and demonstrated an understanding of the  instructions.   The patient was advised to call back or seek an in-person evaluation if the symptoms worsen or if the  condition fails to improve as anticipated.   I provided 21+ minutes of time during this encounter. Follow up plan: Return in about 6 months (around 12/26/2019) for Physical Exam.

## 2019-06-25 NOTE — Assessment & Plan Note (Signed)
The current medical regimen is effective;  continue present plan and medications.  

## 2019-06-25 NOTE — Assessment & Plan Note (Addendum)
Faithful use of CPAP equipment every night with good resolution of daytime symptoms and no daytime drowsiness or issues. It is necessary for this patient to continue use of his CPAP equipment.

## 2019-06-29 ENCOUNTER — Other Ambulatory Visit: Payer: Self-pay

## 2019-06-29 ENCOUNTER — Other Ambulatory Visit: Payer: Medicare Other

## 2019-06-29 DIAGNOSIS — E7800 Pure hypercholesterolemia, unspecified: Secondary | ICD-10-CM | POA: Diagnosis not present

## 2019-06-29 DIAGNOSIS — I129 Hypertensive chronic kidney disease with stage 1 through stage 4 chronic kidney disease, or unspecified chronic kidney disease: Secondary | ICD-10-CM

## 2019-06-29 DIAGNOSIS — E0821 Diabetes mellitus due to underlying condition with diabetic nephropathy: Secondary | ICD-10-CM

## 2019-06-29 DIAGNOSIS — N183 Chronic kidney disease, stage 3 unspecified: Secondary | ICD-10-CM

## 2019-06-29 LAB — BAYER DCA HB A1C WAIVED: HB A1C (BAYER DCA - WAIVED): 6.8 % (ref <7.0)

## 2019-06-30 LAB — COMPREHENSIVE METABOLIC PANEL
ALT: 23 IU/L (ref 0–44)
AST: 20 IU/L (ref 0–40)
Albumin/Globulin Ratio: 1.8 (ref 1.2–2.2)
Albumin: 4.2 g/dL (ref 3.8–4.8)
Alkaline Phosphatase: 60 IU/L (ref 39–117)
BUN/Creatinine Ratio: 13 (ref 10–24)
BUN: 23 mg/dL (ref 8–27)
Bilirubin Total: 0.3 mg/dL (ref 0.0–1.2)
CO2: 21 mmol/L (ref 20–29)
Calcium: 9 mg/dL (ref 8.6–10.2)
Chloride: 105 mmol/L (ref 96–106)
Creatinine, Ser: 1.78 mg/dL — ABNORMAL HIGH (ref 0.76–1.27)
GFR calc Af Amer: 45 mL/min/{1.73_m2} — ABNORMAL LOW (ref >59)
GFR calc non Af Amer: 39 mL/min/{1.73_m2} — ABNORMAL LOW (ref >59)
Globulin, Total: 2.4 g/dL (ref 1.5–4.5)
Glucose: 203 mg/dL — ABNORMAL HIGH (ref 65–99)
Potassium: 4.3 mmol/L (ref 3.5–5.2)
Sodium: 140 mmol/L (ref 134–144)
Total Protein: 6.6 g/dL (ref 6.0–8.5)

## 2019-06-30 LAB — CBC WITH DIFFERENTIAL/PLATELET
Basophils Absolute: 0 10*3/uL (ref 0.0–0.2)
Basos: 1 %
EOS (ABSOLUTE): 0.4 10*3/uL (ref 0.0–0.4)
Eos: 7 %
Hematocrit: 37.7 % (ref 37.5–51.0)
Hemoglobin: 12.6 g/dL — ABNORMAL LOW (ref 13.0–17.7)
Immature Grans (Abs): 0 10*3/uL (ref 0.0–0.1)
Immature Granulocytes: 0 %
Lymphocytes Absolute: 1.2 10*3/uL (ref 0.7–3.1)
Lymphs: 22 %
MCH: 29.9 pg (ref 26.6–33.0)
MCHC: 33.4 g/dL (ref 31.5–35.7)
MCV: 90 fL (ref 79–97)
Monocytes Absolute: 0.5 10*3/uL (ref 0.1–0.9)
Monocytes: 9 %
Neutrophils Absolute: 3.1 10*3/uL (ref 1.4–7.0)
Neutrophils: 61 %
Platelets: 134 10*3/uL — ABNORMAL LOW (ref 150–450)
RBC: 4.21 x10E6/uL (ref 4.14–5.80)
RDW: 14.6 % (ref 11.6–15.4)
WBC: 5.2 10*3/uL (ref 3.4–10.8)

## 2019-06-30 LAB — LIPID PANEL
Chol/HDL Ratio: 4.5 ratio (ref 0.0–5.0)
Cholesterol, Total: 144 mg/dL (ref 100–199)
HDL: 32 mg/dL — ABNORMAL LOW (ref >39)
LDL Calculated: 82 mg/dL (ref 0–99)
Triglycerides: 151 mg/dL — ABNORMAL HIGH (ref 0–149)
VLDL Cholesterol Cal: 30 mg/dL (ref 5–40)

## 2019-07-06 ENCOUNTER — Encounter: Payer: Self-pay | Admitting: Family Medicine

## 2019-07-29 DIAGNOSIS — N183 Chronic kidney disease, stage 3 (moderate): Secondary | ICD-10-CM | POA: Diagnosis not present

## 2019-07-29 DIAGNOSIS — Z6835 Body mass index (BMI) 35.0-35.9, adult: Secondary | ICD-10-CM | POA: Diagnosis not present

## 2019-07-29 DIAGNOSIS — I10 Essential (primary) hypertension: Secondary | ICD-10-CM | POA: Diagnosis not present

## 2019-09-29 DIAGNOSIS — Z23 Encounter for immunization: Secondary | ICD-10-CM | POA: Diagnosis not present

## 2019-10-08 DIAGNOSIS — H2513 Age-related nuclear cataract, bilateral: Secondary | ICD-10-CM | POA: Diagnosis not present

## 2019-10-08 DIAGNOSIS — Z7984 Long term (current) use of oral hypoglycemic drugs: Secondary | ICD-10-CM | POA: Diagnosis not present

## 2019-10-08 DIAGNOSIS — H10811 Pingueculitis, right eye: Secondary | ICD-10-CM | POA: Diagnosis not present

## 2019-10-08 DIAGNOSIS — H353121 Nonexudative age-related macular degeneration, left eye, early dry stage: Secondary | ICD-10-CM | POA: Diagnosis not present

## 2019-10-08 DIAGNOSIS — E119 Type 2 diabetes mellitus without complications: Secondary | ICD-10-CM | POA: Diagnosis not present

## 2019-12-05 ENCOUNTER — Encounter: Payer: Self-pay | Admitting: Nurse Practitioner

## 2019-12-07 ENCOUNTER — Encounter: Payer: Self-pay | Admitting: Nurse Practitioner

## 2019-12-07 ENCOUNTER — Other Ambulatory Visit: Payer: Self-pay

## 2019-12-07 ENCOUNTER — Ambulatory Visit (INDEPENDENT_AMBULATORY_CARE_PROVIDER_SITE_OTHER): Payer: Medicare Other | Admitting: Nurse Practitioner

## 2019-12-07 VITALS — BP 115/69 | HR 85 | Temp 98.3°F | Ht 64.67 in | Wt 219.4 lb

## 2019-12-07 DIAGNOSIS — G4733 Obstructive sleep apnea (adult) (pediatric): Secondary | ICD-10-CM

## 2019-12-07 DIAGNOSIS — Z0000 Encounter for general adult medical examination without abnormal findings: Secondary | ICD-10-CM

## 2019-12-07 DIAGNOSIS — E785 Hyperlipidemia, unspecified: Secondary | ICD-10-CM

## 2019-12-07 DIAGNOSIS — Z6836 Body mass index (BMI) 36.0-36.9, adult: Secondary | ICD-10-CM | POA: Diagnosis not present

## 2019-12-07 DIAGNOSIS — Z6833 Body mass index (BMI) 33.0-33.9, adult: Secondary | ICD-10-CM | POA: Insufficient documentation

## 2019-12-07 DIAGNOSIS — K862 Cyst of pancreas: Secondary | ICD-10-CM | POA: Diagnosis not present

## 2019-12-07 DIAGNOSIS — I359 Nonrheumatic aortic valve disorder, unspecified: Secondary | ICD-10-CM | POA: Diagnosis not present

## 2019-12-07 DIAGNOSIS — K219 Gastro-esophageal reflux disease without esophagitis: Secondary | ICD-10-CM | POA: Diagnosis not present

## 2019-12-07 DIAGNOSIS — N400 Enlarged prostate without lower urinary tract symptoms: Secondary | ICD-10-CM | POA: Diagnosis not present

## 2019-12-07 DIAGNOSIS — N1831 Chronic kidney disease, stage 3a: Secondary | ICD-10-CM | POA: Diagnosis not present

## 2019-12-07 DIAGNOSIS — Z9989 Dependence on other enabling machines and devices: Secondary | ICD-10-CM

## 2019-12-07 DIAGNOSIS — I152 Hypertension secondary to endocrine disorders: Secondary | ICD-10-CM

## 2019-12-07 DIAGNOSIS — E1169 Type 2 diabetes mellitus with other specified complication: Secondary | ICD-10-CM

## 2019-12-07 DIAGNOSIS — I10 Essential (primary) hypertension: Secondary | ICD-10-CM | POA: Diagnosis not present

## 2019-12-07 DIAGNOSIS — E1121 Type 2 diabetes mellitus with diabetic nephropathy: Secondary | ICD-10-CM

## 2019-12-07 DIAGNOSIS — Z23 Encounter for immunization: Secondary | ICD-10-CM | POA: Diagnosis not present

## 2019-12-07 DIAGNOSIS — E1159 Type 2 diabetes mellitus with other circulatory complications: Secondary | ICD-10-CM | POA: Diagnosis not present

## 2019-12-07 LAB — MICROALBUMIN, URINE WAIVED
Creatinine, Urine Waived: 50 mg/dL (ref 10–300)
Microalb, Ur Waived: 30 mg/L — ABNORMAL HIGH (ref 0–19)

## 2019-12-07 LAB — BAYER DCA HB A1C WAIVED: HB A1C (BAYER DCA - WAIVED): 7 % — ABNORMAL HIGH (ref <7.0)

## 2019-12-07 MED ORDER — ATORVASTATIN CALCIUM 10 MG PO TABS: 10.0000 mg | ORAL_TABLET | Freq: Every day | ORAL | 4 refills | Status: DC

## 2019-12-07 MED ORDER — TAMSULOSIN HCL 0.4 MG PO CAPS: 0.4000 mg | ORAL_CAPSULE | Freq: Every day | ORAL | 4 refills | Status: DC

## 2019-12-07 MED ORDER — LISINOPRIL 10 MG PO TABS: 10.0000 mg | ORAL_TABLET | Freq: Every day | ORAL | 4 refills | Status: DC

## 2019-12-07 MED ORDER — OMEPRAZOLE 20 MG PO CPDR: 20.0000 mg | DELAYED_RELEASE_CAPSULE | Freq: Every day | ORAL | 3 refills | Status: DC

## 2019-12-07 MED ORDER — FINASTERIDE 5 MG PO TABS: 5.0000 mg | ORAL_TABLET | Freq: Every day | ORAL | 4 refills | Status: DC

## 2019-12-07 MED ORDER — GLIMEPIRIDE 2 MG PO TABS: 2.0000 mg | ORAL_TABLET | Freq: Every day | ORAL | 4 refills | Status: DC

## 2019-12-07 NOTE — Assessment & Plan Note (Signed)
Monitored by [facility] providers, continue collaboration. °

## 2019-12-07 NOTE — Assessment & Plan Note (Addendum)
With diabetes and HTN.  Recommend continued focus on healthy diet choices and regular physical activity (30 minutes 5 days a week).

## 2019-12-07 NOTE — Assessment & Plan Note (Signed)
Chronic, stable with minimal Prilosec use.  Continue current regimen and check Mag level today.

## 2019-12-07 NOTE — Assessment & Plan Note (Signed)
Chronic, ongoing.  Continue current medication regimen and adjust as needed.  Lipid panel and CMP today.  Refills sent.  

## 2019-12-07 NOTE — Assessment & Plan Note (Signed)
Chronic, ongoing with A1C 7.0% today.  Continue current medication regimen and adjust as needed.  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  Monitor use of Glimepiride due to age and if BS consistently <70 consider alternate options.  Check BS at home as least three mornings a week.  Return in 3 months.

## 2019-12-07 NOTE — Progress Notes (Addendum)
BP 115/69 (BP Location: Left Arm, Patient Position: Sitting, Cuff Size: Normal)   Pulse 85   Temp 98.3 F (36.8 C) (Oral)   Ht 5' 4.67" (1.643 m)   Wt 219 lb 6.4 oz (99.5 kg)   SpO2 97%   BMI 36.89 kg/m    Subjective:    Patient ID: Thomas Huang, male    DOB: [birth year], 69 y.o.   MRN: [id number]  HPI: Thomas Huang is a 69 y.o. male presenting on 12/07/2019 for comprehensive medical examination. Current medical complaints include:none  He currently lives with: wife Interim Problems from his last visit: no   DIABETES A1C in August was 6.8%.  Continues on Glimepiride. Hypoglycemic episodes:no Polydipsia/polyuria: no Visual disturbance: no Chest pain: no Paresthesias: no Glucose Monitoring: yes  Accucheck frequency: once every two weeks  Fasting glucose: this morning after breakfast 240, usually fasting 150  Post prandial:  Evening:  Before meals: Taking Insulin?: no  Long acting insulin:  Short acting insulin: Blood Pressure Monitoring: daily Retinal Examination: Up to Date Foot Exam: Up to Date Pneumovax: Up to Date Influenza: Up to Date, at [place] received it Aspirin: no   HYPERTENSION / HYPERLIPIDEMIA Continues on Lisinopril and Atorvastatin.  Echo in 2017 with EF 60-65%, mild to moderately calcified leaflets.  Saw cardiology in 2017 and has not returned.  Uses CPAP 100% at home for OSA.  Satisfied with current treatment? yes Duration of hypertension: chronic BP monitoring frequency: rarely BP range: 120/70's BP medication side effects: no Duration of hyperlipidemia: chronic Cholesterol medication side effects: no Cholesterol supplements: none Medication compliance: good compliance Aspirin: no Recent stressors: no Recurrent headaches: no Visual changes: no Palpitations: no Dyspnea: no Chest pain: no Lower extremity edema: no Dizzy/lightheaded: no   CHRONIC KIDNEY DISEASE GFR in August 39 with CRT 1.78.  Sees nephrology at [facility] and reports  kidneys are active at 35%.  Goes once a year to see them.   CKD status: stable Medications renally dose: yes Previous renal evaluation: yes Pneumovax:  Up to Date Influenza Vaccine:  Up to Date   BPH Continues on Finasteride and Tamsulosin.   BPH status: stable Satisfied with current treatment?: yes Medication side effects: no Medication compliance: good compliance Duration: chronic Nocturia: no Urinary frequency:no Incomplete voiding: no Urgency: occasionally Weak urinary stream: no Straining to start stream: no Dysuria: no Onset: gradual Severity: mild  GERD Continues on Prilosec, takes this as needed. GERD control status: stable  Satisfied with current treatment? yes Heartburn frequency: once or twice a week Medication side effects: no  Medication compliance: stable Previous GERD medications: Prilosec Antacid use frequency:  Occasional TUMS Location: epigastric Dysphagia: no Odynophagia:  no Hematemesis: no Blood in stool: no EGD: yes   PANCREATIC CYST: Followed by [facility] once a year and has yearly MRI with them.  Monitoring area, no current surgery.   Functional Status Survey: Is the patient deaf or have difficulty hearing?: No Does the patient have difficulty seeing, even when wearing glasses/contacts?: No Does the patient have difficulty concentrating, remembering, or making decisions?: No Does the patient have difficulty walking or climbing stairs?: No Does the patient have difficulty dressing or bathing?: No Does the patient have difficulty doing errands alone such as visiting a doctor's office or shopping?: No  FALL RISK: Fall Risk  12/07/2019 12/25/2018 12/19/2017 09/25/2017 06/25/2017  Falls in the past year? 0 0 No No No  Number falls in past yr: 0 0 - - -  Injury with  Fall? 0 0 - - -    Depression Screen Depression screen [facility] 2/9 12/07/2019 12/25/2018 12/19/2017 09/25/2017 06/25/2017  Decreased Interest 0 0 0 0 0  Down, Depressed, Hopeless 0 0 0 0 0    PHQ - 2 Score 0 0 0 0 0  Altered sleeping 0 0 - - -  Tired, decreased energy 0 0 - - -  Change in appetite 0 0 - - -  Feeling bad or failure about yourself  0 0 - - -  Trouble concentrating 0 0 - - -  Moving slowly or fidgety/restless 0 0 - - -  Suicidal thoughts 0 0 - - -  PHQ-9 Score 0 0 - - -  Difficult doing work/chores - Not difficult at all - - -    Advanced Directives <no information>  Past Medical History:  Past Medical History:  Diagnosis Date  . Arthritis    lower back  . Carpal tunnel syndrome of left wrist   . Chronic kidney disease   . Diabetes mellitus, type 2 ([place])   . GERD (gastroesophageal reflux disease)   . Heart murmur    followed by PCP  . Hyperlipidemia   . Sleep apnea    CPAP  . Vertigo    mild. several episodes/wk    Surgical History:  Past Surgical History:  Procedure Laterality Date  . CARPAL TUNNEL RELEASE    . CLOSED REDUCTION NASAL FRACTURE N/A 11/05/2017   Procedure: CLOSED REDUCTION NASAL FRACTURE WITH NASAL TURBINATE COBLATION;  Surgeon: Clyde Canterbury, MD;  Location: [place];  Service: ENT;  Laterality: N/A;  Diabetic - oral meds  . LAMINECTOMY     lumbar, [facility]  . recurrent kidney stones      Medications:  Current Outpatient Medications on File Prior to Visit  Medication Sig  . glucose blood test strip 1 each by Other route as needed for other. Use as instructed  . polyethylene glycol (MIRALAX / GLYCOLAX) packet Take 17 g by mouth daily as needed.  . sildenafil (REVATIO) 20 MG tablet Take 1 tablet (20 mg total) by mouth once as needed. Take 1-5 prn ED  . fluticasone (FLONASE) 50 MCG/ACT nasal spray INSTILL 2 SPRAYS INTO EACH NOSTRIL EVERY DAY   No current facility-administered medications on file prior to visit.    Allergies:  Allergies  Allergen Reactions  . Ergocalciferol Other (See Comments)    Insomnia  . Nsaids     Avoids because of kidney issues  . Pollen Extract Other (See Comments)    Sneezing, watery  eyes, sinus drainage  . Shellfish Allergy Other (See Comments)    Cannot have IVP due. Pt has renal failure  . Vitamin D Analogs Other (See Comments)    Insomnia    Social History:  Social History   Socioeconomic History  . Marital status: Married    Spouse name: Not on file  . Number of children: Not on file  . Years of education: Not on file  . Highest education level: Not on file  Occupational History  . Not on file  Tobacco Use  . Smoking status: Never Smoker  . Smokeless tobacco: Never Used  Substance and Sexual Activity  . Alcohol use: No    Alcohol/week: 0.0 standard drinks  . Drug use: No  . Sexual activity: Yes  Other Topics Concern  . Not on file  Social History Narrative  . Not on file   Social Determinants of Health   Financial Resource  Strain:   . Difficulty of Paying Living Expenses: Not on file  Food Insecurity:   . Worried About Charity fundraiser in the Last Year: Not on file  . Ran Out of Food in the Last Year: Not on file  Transportation Needs:   . Lack of Transportation (Medical): Not on file  . Lack of Transportation (Non-Medical): Not on file  Physical Activity:   . Days of Exercise per Week: Not on file  . Minutes of Exercise per Session: Not on file  Stress:   . Feeling of Stress : Not on file  Social Connections:   . Frequency of Communication with Friends and Family: Not on file  . Frequency of Social Gatherings with Friends and Family: Not on file  . Attends Religious Services: Not on file  . Active Member of Clubs or Organizations: Not on file  . Attends Archivist Meetings: Not on file  . Marital Status: Not on file  Intimate Partner Violence:   . Fear of Current or Ex-Partner: Not on file  . Emotionally Abused: Not on file  . Physically Abused: Not on file  . Sexually Abused: Not on file   Social History   Tobacco Use  Smoking Status Never Smoker  Smokeless Tobacco Never Used   Social History   Substance and  Sexual Activity  Alcohol Use No  . Alcohol/week: 0.0 standard drinks    Family History:  Family History  Problem Relation Age of Onset  . Diabetes Mother   . Heart disease Mother   . Cancer Mother        breast and lung  . Stroke Mother   . Heart attack Mother   . Cancer Father        pancreatic  . Hypertension Brother   . Hyperlipidemia Brother   . Diabetes Brother   . Diabetes Maternal Grandmother   . Diabetes Paternal Grandmother     Past medical history, surgical history, medications, allergies, family history and social history reviewed with patient today and changes made to appropriate areas of the chart.   Review of Systems - negative All other ROS negative except what is listed above and in the HPI.      Objective:    BP 115/69 (BP Location: Left Arm, Patient Position: Sitting, Cuff Size: Normal)   Pulse 85   Temp 98.3 F (36.8 C) (Oral)   Ht 5' 4.67" (1.643 m)   Wt 219 lb 6.4 oz (99.5 kg)   SpO2 97%   BMI 36.89 kg/m   Wt Readings from Last 3 Encounters:  12/07/19 219 lb 6.4 oz (99.5 kg)  06/25/19 214 lb (97.1 kg)  06/01/19 225 lb (102.1 kg)    Physical Exam Vitals and nursing note reviewed.  Constitutional:      General: He is awake. He is not in acute distress.    Appearance: He is well-developed. He is obese. He is not ill-appearing.  HENT:     Head: Normocephalic and atraumatic.     Right Ear: Hearing, tympanic membrane, ear canal and external ear normal. No drainage.     Left Ear: Hearing, tympanic membrane, ear canal and external ear normal. No drainage.     Nose: Nose normal.     Mouth/Throat:     Pharynx: Uvula midline.  Eyes:     General: Lids are normal.        Right eye: No discharge.        Left eye: No  discharge.     Extraocular Movements: Extraocular movements intact.     Conjunctiva/sclera: Conjunctivae normal.     Pupils: Pupils are equal, round, and reactive to light.     Visual Fields: Right eye visual fields normal and left  eye visual fields normal.  Neck:     Thyroid: No thyromegaly.     Vascular: No carotid bruit or JVD.     Trachea: Trachea normal.  Cardiovascular:     Rate and Rhythm: Normal rate and regular rhythm.     Heart sounds: S1 normal and S2 normal. Murmur present. Systolic murmur present with a grade of 2/6. No gallop.   Pulmonary:     Effort: Pulmonary effort is normal. No accessory muscle usage or respiratory distress.     Breath sounds: Normal breath sounds.  Abdominal:     General: Bowel sounds are normal.     Palpations: Abdomen is soft. There is no hepatomegaly or splenomegaly.     Tenderness: There is no abdominal tenderness.  Genitourinary:    Comments: Deferred per patient request.  Denies concerns. Musculoskeletal:        General: Normal range of motion.     Cervical back: Normal range of motion and neck supple.     Right lower leg: No edema.     Left lower leg: No edema.  Lymphadenopathy:     Head:     Right side of head: No submental, submandibular, tonsillar, preauricular or posterior auricular adenopathy.     Left side of head: No submental, submandibular, tonsillar, preauricular or posterior auricular adenopathy.     Cervical: No cervical adenopathy.  Skin:    General: Skin is warm and dry.     Capillary Refill: Capillary refill takes less than 2 seconds.     Findings: No rash.  Neurological:     Mental Status: He is alert and oriented to person, place, and time.     Cranial Nerves: Cranial nerves are intact.     Gait: Gait is intact.     Deep Tendon Reflexes: Reflexes are normal and symmetric.     Reflex Scores:      Brachioradialis reflexes are 2+ on the right side and 2+ on the left side.      Patellar reflexes are 2+ on the right side and 2+ on the left side. Psychiatric:        Attention and Perception: Attention normal.        Mood and Affect: Mood normal.        Speech: Speech normal.        Behavior: Behavior normal. Behavior is cooperative.        Thought  Content: Thought content normal.        Cognition and Memory: Cognition normal.        Judgment: Judgment normal.    Diabetic Foot Exam - Simple   Simple Foot Form Visual Inspection See comments: Yes Sensation Testing Intact to touch and monofilament testing bilaterally: Yes Pulse Check Posterior Tibialis and Dorsalis pulse intact bilaterally: Yes Comments Thick, yellow toenails     Results for orders placed or performed in visit on 06/29/19  Bayer DCA Hb A1c Waived  Result Value Ref Range   HB A1C (BAYER DCA - WAIVED) 6.8 <7.0 %  CBC with Differential/Platelet  Result Value Ref Range   WBC 5.2 3.4 - 10.8 x10E3/uL   RBC 4.21 4.14 - 5.80 x10E6/uL   Hemoglobin 12.6 (L) 13.0 - 17.7 g/dL  Hematocrit 37.7 37.5 - 51.0 %   MCV 90 79 - 97 fL   MCH 29.9 26.6 - 33.0 pg   MCHC 33.4 31.5 - 35.7 g/dL   RDW 14.6 11.6 - 15.4 %   Platelets 134 (L) 150 - 450 x10E3/uL   Neutrophils 61 Not Estab. %   Lymphs 22 Not Estab. %   Monocytes 9 Not Estab. %   Eos 7 Not Estab. %   Basos 1 Not Estab. %   Neutrophils Absolute 3.1 1.4 - 7.0 x10E3/uL   Lymphocytes Absolute 1.2 0.7 - 3.1 x10E3/uL   Monocytes Absolute 0.5 0.1 - 0.9 x10E3/uL   EOS (ABSOLUTE) 0.4 0.0 - 0.4 x10E3/uL   Basophils Absolute 0.0 0.0 - 0.2 x10E3/uL   Immature Granulocytes 0 Not Estab. %   Immature Grans (Abs) 0.0 0.0 - 0.1 x10E3/uL  Lipid panel  Result Value Ref Range   Cholesterol, Total 144 100 - 199 mg/dL   Triglycerides 151 (H) 0 - 149 mg/dL   HDL 32 (L) >39 mg/dL   VLDL Cholesterol Cal 30 5 - 40 mg/dL   LDL Calculated 82 0 - 99 mg/dL   Chol/HDL Ratio 4.5 0.0 - 5.0 ratio  Comprehensive metabolic panel  Result Value Ref Range   Glucose 203 (H) 65 - 99 mg/dL   BUN 23 8 - 27 mg/dL   Creatinine, Ser 1.78 (H) 0.76 - 1.27 mg/dL   GFR calc non Af Amer 39 (L) >59 mL/min/1.73   GFR calc Af Amer 45 (L) >59 mL/min/1.73   BUN/Creatinine Ratio 13 10 - 24   Sodium 140 134 - 144 mmol/L   Potassium 4.3 3.5 - 5.2 mmol/L    Chloride 105 96 - 106 mmol/L   CO2 21 20 - 29 mmol/L   Calcium 9.0 8.6 - 10.2 mg/dL   Total Protein 6.6 6.0 - 8.5 g/dL   Albumin 4.2 3.8 - 4.8 g/dL   Globulin, Total 2.4 1.5 - 4.5 g/dL   Albumin/Globulin Ratio 1.8 1.2 - 2.2   Bilirubin Total 0.3 0.0 - 1.2 mg/dL   Alkaline Phosphatase 60 39 - 117 IU/L   AST 20 0 - 40 IU/L   ALT 23 0 - 44 IU/L      Assessment & Plan:   Problem List Items Addressed This Visit      Cardiovascular and Mediastinum   Aortic valve calcification    Stable with no symptoms.  Continue to monitor and return to cardiology as needed.      Relevant Medications   lisinopril (ZESTRIL) 10 MG tablet   atorvastatin (LIPITOR) 10 MG tablet   Hypertension associated with diabetes ([place])    Chronic, stable with BP at goal in office and home readings.  Continue current medication regimen and adjust as needed.  Recommend checking BP at least three times a week at home.  Labs today and refills sent.      Relevant Medications   lisinopril (ZESTRIL) 10 MG tablet   atorvastatin (LIPITOR) 10 MG tablet   glimepiride (AMARYL) 2 MG tablet   Other Relevant Orders   Bayer DCA Hb A1c Waived   Microalbumin, Urine Waived here   CBC with Differential/Platelet out   TSH     Respiratory   OSA on CPAP    100% use of CPAP at home, continue adherent regimen.        Digestive   GERD (gastroesophageal reflux disease)    Chronic, stable with minimal Prilosec use.  Continue current regimen and  check Mag level today.      Relevant Medications   omeprazole (PRILOSEC) 20 MG capsule   Other Relevant Orders   TSH   Magnesium   Pancreatic cyst    Monitored by [facility] providers, continue collaboration.        Endocrine   Hyperlipidemia associated with type 2 diabetes mellitus (HCC)    Chronic, ongoing.  Continue current medication regimen and adjust as needed.  Lipid panel and CMP today.  Refills sent.      Relevant Medications   lisinopril (ZESTRIL) 10 MG tablet    atorvastatin (LIPITOR) 10 MG tablet   glimepiride (AMARYL) 2 MG tablet   Other Relevant Orders   Bayer DCA Hb A1c Waived   Comprehensive metabolic panel   Lipid Panel w/o Chol/HDL Ratio out   Diabetes mellitus with renal manifestation (HCC)    Chronic, ongoing with A1C 7.0% today.  Continue current medication regimen and adjust as needed.  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  Monitor use of Glimepiride due to age and if BS consistently <70 consider alternate options.  Check BS at home as least three mornings a week.  Return in 3 months.       Relevant Medications   lisinopril (ZESTRIL) 10 MG tablet   atorvastatin (LIPITOR) 10 MG tablet   glimepiride (AMARYL) 2 MG tablet   Other Relevant Orders   Bayer DCA Hb A1c Waived     Genitourinary   CKD (chronic kidney disease), stage III    Monitored by nephrology at [facility], continue collaboration and Lisinopril at this time.  Labs today.      Relevant Medications   lisinopril (ZESTRIL) 10 MG tablet   Other Relevant Orders   Microalbumin, Urine Waived here   BPH (benign prostatic hyperplasia)    Chronic, stable on current regimen.  Obtain PSA today per patient request.      Relevant Medications   tamsulosin (FLOMAX) 0.4 MG CAPS capsule   finasteride (PROSCAR) 5 MG tablet   Other Relevant Orders   PSA     Other   Morbid obesity ([place])    With diabetes and HTN.  Recommend continued focus on healthy diet choices and regular physical activity (30 minutes 5 days a week).       Relevant Medications   glimepiride (AMARYL) 2 MG tablet   BMI 36.0-36.9,adult    Recommend continued focus on healthy diet choices and regular physical activity (30 minutes 5 days a week).  Focus on small goals at a time.        Other Visit Diagnoses    Annual physical exam    -  Primary   Annual physical labs to include CBC, CMP, TSH, lipid, PSA      Discussed aspirin prophylaxis for myocardial infarction prevention and decision was that we  recommended ASA, and patient refused  LABORATORY TESTING:  Health maintenance labs ordered today as discussed above.   The natural history of prostate cancer and ongoing controversy regarding screening and potential treatment outcomes of prostate cancer has been discussed with the patient. The meaning of a false positive PSA and a false negative PSA has been discussed. He indicates understanding of the limitations of this screening test and wishes to proceed with screening PSA testing.   IMMUNIZATIONS:   - Tdap: Tetanus vaccination status reviewed: last tetanus booster within 10 years. - Influenza: Refused - Pneumovax: Up to date - Prevnar: Not applicable - Zostavax vaccine: Refused  SCREENING: - Colonoscopy: wishes  to schedule himself with [facility] Discussed with patient purpose of the colonoscopy is to detect colon cancer at curable precancerous or early stages   - AAA Screening: Not applicable  -Hearing Test: Not applicable  -Spirometry: Not applicable   PATIENT COUNSELING:    Sexuality: Discussed sexually transmitted diseases, partner selection, use of condoms, avoidance of unintended pregnancy  and contraceptive alternatives.   Advised to avoid cigarette smoking.  I discussed with the patient that most people either abstain from alcohol or drink within safe limits (<=14/week and <=4 drinks/occasion for males, <=7/weeks and <= 3 drinks/occasion for females) and that the risk for alcohol disorders and other health effects rises proportionally with the number of drinks per week and how often a drinker exceeds daily limits.  Discussed cessation/primary prevention of drug use and availability of treatment for abuse.   Diet: Encouraged to adjust caloric intake to maintain  or achieve ideal body weight, to reduce intake of dietary saturated fat and total fat, to limit sodium intake by avoiding high sodium foods and not adding table salt, and to maintain adequate dietary  potassium and calcium preferably from fresh fruits, vegetables, and low-fat dairy products.    stressed the importance of regular exercise  Injury prevention: Discussed safety belts, safety helmets, smoke detector, smoking near bedding or upholstery.   Dental health: Discussed importance of regular tooth brushing, flossing, and dental visits.   Follow up plan: NEXT PREVENTATIVE PHYSICAL DUE IN 1 YEAR. Return in about 3 months (around 03/06/2020) for T2DM, HTN/HLD.

## 2019-12-07 NOTE — Assessment & Plan Note (Signed)
100% use of CPAP at home, continue adherent regimen. 

## 2019-12-07 NOTE — Assessment & Plan Note (Signed)
Chronic, stable on current regimen.  Obtain PSA today per patient request.

## 2019-12-07 NOTE — Assessment & Plan Note (Signed)
Stable with no symptoms.  Continue to monitor and return to cardiology as needed. 

## 2019-12-07 NOTE — Patient Instructions (Addendum)
[place] for ear cleaning  Carbohydrate Counting for Diabetes Mellitus, Adult  Carbohydrate counting is a method of keeping track of how many carbohydrates you eat. Eating carbohydrates naturally increases the amount of sugar (glucose) in the blood. Counting how many carbohydrates you eat helps keep your blood glucose within normal limits, which helps you manage your diabetes (diabetes mellitus). It is important to know how many carbohydrates you can safely have in each meal. This is different for every person. A diet and nutrition specialist (registered dietitian) can help you make a meal plan and calculate how many carbohydrates you should have at each meal and snack. Carbohydrates are found in the following foods:  Grains, such as breads and cereals.  Dried beans and soy products.  Starchy vegetables, such as potatoes, peas, and corn.  Fruit and fruit juices.  Milk and yogurt.  Sweets and snack foods, such as cake, cookies, candy, chips, and soft drinks. How do I count carbohydrates? There are two ways to count carbohydrates in food. You can use either of the methods or a combination of both. Reading "Nutrition Facts" on packaged food The "Nutrition Facts" list is included on the labels of almost all packaged foods and beverages in the [place] It includes:  The serving size.  Information about nutrients in each serving, including the grams (g) of carbohydrate per serving. To use the "Nutrition Facts":  Decide how many servings you will have.  Multiply the number of servings by the number of carbohydrates per serving.  The resulting number is the total amount of carbohydrates that you will be having. Learning standard serving sizes of other foods When you eat carbohydrate foods that are not packaged or do not include "Nutrition Facts" on the label, you need to measure the servings in order to count the amount of carbohydrates:  Measure the foods that you will eat with a food scale  or measuring cup, if needed.  Decide how many standard-size servings you will eat.  Multiply the number of servings by 15. Most carbohydrate-rich foods have about 15 g of carbohydrates per serving. ? For example, if you eat 8 oz (170 g) of strawberries, you will have eaten 2 servings and 30 g of carbohydrates (2 servings x 15 g = 30 g).  For foods that have more than one food mixed, such as soups and casseroles, you must count the carbohydrates in each food that is included. The following list contains standard serving sizes of common carbohydrate-rich foods. Each of these servings has about 15 g of carbohydrates:   hamburger bun or  English muffin.   oz (15 mL) syrup.   oz (14 g) jelly.  1 slice of bread.  1 six-inch tortilla.  3 oz (85 g) cooked rice or pasta.  4 oz (113 g) cooked dried beans.  4 oz (113 g) starchy vegetable, such as peas, corn, or potatoes.  4 oz (113 g) hot cereal.  4 oz (113 g) mashed potatoes or  of a large baked potato.  4 oz (113 g) canned or frozen fruit.  4 oz (120 mL) fruit juice.  4-6 crackers.  6 chicken nuggets.  6 oz (170 g) unsweetened dry cereal.  6 oz (170 g) plain fat-free yogurt or yogurt sweetened with artificial sweeteners.  8 oz (240 mL) milk.  8 oz (170 g) fresh fruit or one small piece of fruit.  24 oz (680 g) popped popcorn. Example of carbohydrate counting Sample meal  3 oz (85 g) chicken breast.  6 oz (170 g) brown rice.  4 oz (113 g) corn.  8 oz (240 mL) milk.  8 oz (170 g) strawberries with sugar-free whipped topping. Carbohydrate calculation 1. Identify the foods that contain carbohydrates: ? Rice. ? Corn. ? Milk. ? Strawberries. 2. Calculate how many servings you have of each food: ? 2 servings rice. ? 1 serving corn. ? 1 serving milk. ? 1 serving strawberries. 3. Multiply each number of servings by 15 g: ? 2 servings rice x 15 g = 30 g. ? 1 serving corn x 15 g = 15 g. ? 1 serving milk x 15  g = 15 g. ? 1 serving strawberries x 15 g = 15 g. 4. Add together all of the amounts to find the total grams of carbohydrates eaten: ? 30 g + 15 g + 15 g + 15 g = 75 g of carbohydrates total. Summary  Carbohydrate counting is a method of keeping track of how many carbohydrates you eat.  Eating carbohydrates naturally increases the amount of sugar (glucose) in the blood.  Counting how many carbohydrates you eat helps keep your blood glucose within normal limits, which helps you manage your diabetes.  A diet and nutrition specialist (registered dietitian) can help you make a meal plan and calculate how many carbohydrates you should have at each meal and snack. This information is not intended to replace advice given to you by your health care provider. Make sure you discuss any questions you have with your health care provider. Document Revised: 06/06/2017 Document Reviewed: 04/25/2016 Elsevier Patient Education  [place].

## 2019-12-07 NOTE — Assessment & Plan Note (Signed)
Monitored by nephrology at [facility], continue collaboration and Lisinopril at this time.  Labs today.

## 2019-12-07 NOTE — Assessment & Plan Note (Signed)
Chronic, stable with BP at goal in office and home readings.  Continue current medication regimen and adjust as needed.  Recommend checking BP at least three times a week at home.  Labs today and refills sent.

## 2019-12-07 NOTE — Assessment & Plan Note (Signed)
Recommend continued focus on healthy diet choices and regular physical activity (30 minutes 5 days a week).  Focus on small goals at a time.

## 2019-12-08 LAB — COMPREHENSIVE METABOLIC PANEL
ALT: 26 IU/L (ref 0–44)
AST: 24 IU/L (ref 0–40)
Albumin/Globulin Ratio: 1.6 (ref 1.2–2.2)
Albumin: 4.3 g/dL (ref 3.8–4.8)
Alkaline Phosphatase: 71 IU/L (ref 39–117)
BUN/Creatinine Ratio: 14 (ref 10–24)
BUN: 26 mg/dL (ref 8–27)
Bilirubin Total: 0.3 mg/dL (ref 0.0–1.2)
CO2: 20 mmol/L (ref 20–29)
Calcium: 9.3 mg/dL (ref 8.6–10.2)
Chloride: 105 mmol/L (ref 96–106)
Creatinine, Ser: 1.87 mg/dL — ABNORMAL HIGH (ref 0.76–1.27)
GFR calc Af Amer: 42 mL/min/{1.73_m2} — ABNORMAL LOW (ref >59)
GFR calc non Af Amer: 36 mL/min/{1.73_m2} — ABNORMAL LOW (ref >59)
Globulin, Total: 2.7 g/dL (ref 1.5–4.5)
Glucose: 130 mg/dL — ABNORMAL HIGH (ref 65–99)
Potassium: 4.4 mmol/L (ref 3.5–5.2)
Sodium: 139 mmol/L (ref 134–144)
Total Protein: 7 g/dL (ref 6.0–8.5)

## 2019-12-08 LAB — CBC WITH DIFFERENTIAL/PLATELET
Basophils Absolute: 0.1 10*3/uL (ref 0.0–0.2)
Basos: 1 %
EOS (ABSOLUTE): 0.4 10*3/uL (ref 0.0–0.4)
Eos: 6 %
Hematocrit: 40.2 % (ref 37.5–51.0)
Hemoglobin: 13.4 g/dL (ref 13.0–17.7)
Immature Grans (Abs): 0 10*3/uL (ref 0.0–0.1)
Immature Granulocytes: 0 %
Lymphocytes Absolute: 1.5 10*3/uL (ref 0.7–3.1)
Lymphs: 22 %
MCH: 30.6 pg (ref 26.6–33.0)
MCHC: 33.3 g/dL (ref 31.5–35.7)
MCV: 92 fL (ref 79–97)
Monocytes Absolute: 0.6 10*3/uL (ref 0.1–0.9)
Monocytes: 9 %
Neutrophils Absolute: 4 10*3/uL (ref 1.4–7.0)
Neutrophils: 62 %
Platelets: 161 10*3/uL (ref 150–450)
RBC: 4.38 x10E6/uL (ref 4.14–5.80)
RDW: 14.2 % (ref 11.6–15.4)
WBC: 6.5 10*3/uL (ref 3.4–10.8)

## 2019-12-08 LAB — LIPID PANEL W/O CHOL/HDL RATIO
Cholesterol, Total: 150 mg/dL (ref 100–199)
HDL: 34 mg/dL — ABNORMAL LOW (ref >39)
LDL Chol Calc (NIH): 86 mg/dL (ref 0–99)
Triglycerides: 172 mg/dL — ABNORMAL HIGH (ref 0–149)
VLDL Cholesterol Cal: 30 mg/dL (ref 5–40)

## 2019-12-08 LAB — TSH: TSH: 2.76 u[IU]/mL (ref 0.450–4.500)

## 2019-12-08 LAB — PSA: Prostate Specific Ag, Serum: 0.2 ng/mL (ref 0.0–4.0)

## 2019-12-08 LAB — MAGNESIUM: Magnesium: 2.1 mg/dL (ref 1.6–2.3)

## 2019-12-08 NOTE — Progress Notes (Signed)
Contacted via MyChart

## 2020-01-18 ENCOUNTER — Telehealth: Payer: Self-pay | Admitting: Nurse Practitioner

## 2020-01-18 NOTE — Telephone Encounter (Signed)
Copied from [place] [id number]. Topic: General - Other >> Jan 18, 2020  3:04 PM Keene Breath wrote: Reason for CRM: Patient called to speak with someone regarding the coding for his appt. On 12/07/19.  Please advise and call patient to discuss at [phone]

## 2020-01-28 ENCOUNTER — Ambulatory Visit: Payer: Medicare Other

## 2020-03-07 ENCOUNTER — Ambulatory Visit: Payer: Medicare Other | Admitting: Nurse Practitioner

## 2020-03-28 ENCOUNTER — Other Ambulatory Visit: Payer: Self-pay

## 2020-03-28 ENCOUNTER — Ambulatory Visit (INDEPENDENT_AMBULATORY_CARE_PROVIDER_SITE_OTHER): Payer: Medicare Other | Admitting: Nurse Practitioner

## 2020-03-28 ENCOUNTER — Encounter: Payer: Self-pay | Admitting: Nurse Practitioner

## 2020-03-28 VITALS — BP 132/69 | HR 75 | Temp 98.2°F | Wt 217.2 lb

## 2020-03-28 DIAGNOSIS — E1159 Type 2 diabetes mellitus with other circulatory complications: Secondary | ICD-10-CM | POA: Diagnosis not present

## 2020-03-28 DIAGNOSIS — I359 Nonrheumatic aortic valve disorder, unspecified: Secondary | ICD-10-CM | POA: Diagnosis not present

## 2020-03-28 DIAGNOSIS — G4733 Obstructive sleep apnea (adult) (pediatric): Secondary | ICD-10-CM | POA: Diagnosis not present

## 2020-03-28 DIAGNOSIS — Z9989 Dependence on other enabling machines and devices: Secondary | ICD-10-CM

## 2020-03-28 DIAGNOSIS — I10 Essential (primary) hypertension: Secondary | ICD-10-CM

## 2020-03-28 DIAGNOSIS — E1169 Type 2 diabetes mellitus with other specified complication: Secondary | ICD-10-CM | POA: Diagnosis not present

## 2020-03-28 DIAGNOSIS — E785 Hyperlipidemia, unspecified: Secondary | ICD-10-CM | POA: Diagnosis not present

## 2020-03-28 DIAGNOSIS — Z6836 Body mass index (BMI) 36.0-36.9, adult: Secondary | ICD-10-CM

## 2020-03-28 DIAGNOSIS — E0821 Diabetes mellitus due to underlying condition with diabetic nephropathy: Secondary | ICD-10-CM

## 2020-03-28 DIAGNOSIS — I152 Hypertension secondary to endocrine disorders: Secondary | ICD-10-CM

## 2020-03-28 DIAGNOSIS — N1832 Chronic kidney disease, stage 3b: Secondary | ICD-10-CM

## 2020-03-28 LAB — BAYER DCA HB A1C WAIVED: HB A1C (BAYER DCA - WAIVED): 7.1 % — ABNORMAL HIGH (ref <7.0)

## 2020-03-28 NOTE — Assessment & Plan Note (Signed)
Refer to morbid obesity plan, recommend focus on diet changes and regular exercise. 

## 2020-03-28 NOTE — Assessment & Plan Note (Signed)
Recommended eating smaller high protein, low fat meals more frequently and exercising 30 mins a day 5 times a week with a goal of 10-15lb weight loss in the next 3 months. Patient voiced their understanding and motivation to adhere to these recommendations.  

## 2020-03-28 NOTE — Assessment & Plan Note (Signed)
Chronic, stable with BP at goal in office and home readings.  Continue current medication regimen and adjust as needed.  Recommend checking BP at least three times a week at home.  BMP next visit.

## 2020-03-28 NOTE — Assessment & Plan Note (Signed)
Stable with no symptoms.  Continue to monitor and return to cardiology as needed. 

## 2020-03-28 NOTE — Patient Instructions (Signed)

## 2020-03-28 NOTE — Assessment & Plan Note (Signed)
Chronic, ongoing with A1C 7.1% today.  Continue current medication regimen and adjust as needed.  He wishes to focus on diet and exercise, as in past when slight elevation A1C he reports he brought it down this way.  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  Monitor use of Glimepiride due to age and if BS consistently <70 consider alternate options.  Check BS at home as least three mornings a week.  Return in 3 months.

## 2020-03-28 NOTE — Assessment & Plan Note (Signed)
Chronic, ongoing.  Continue current medication regimen and adjust as needed.  Lipid panel next visit.    

## 2020-03-28 NOTE — Assessment & Plan Note (Addendum)
Chronic, ongoing.  Monitored by nephrology at [facility], continue collaboration and Lisinopril at this time.  BMP next visit.

## 2020-03-28 NOTE — Assessment & Plan Note (Signed)
100% use of CPAP at home, continue adherent regimen. 

## 2020-03-28 NOTE — Progress Notes (Signed)
BP 132/69   Pulse 75   Temp 98.2 F (36.8 C) (Oral)   Wt 217 lb 3.2 oz (98.5 kg)   SpO2 96%   BMI 36.52 kg/m    Subjective:    Patient ID: Thomas Huang, male    DOB: 05/26/1951, 69 y.o.   MRN: [id number]  HPI: Thomas Huang is a 69 y.o. male  Chief Complaint  Patient presents with  . Diabetes  . Hyperlipidemia  . Hypertension   DIABETES A1C in January was 7%.  Continues on Glimepiride.  He reports usually he can cut out deserts and this can bring it back down, has not been exercising as usually does.   Hypoglycemic episodes:no Polydipsia/polyuria: no Visual disturbance: no Chest pain: no Paresthesias: no Glucose Monitoring: yes             Accucheck frequency: every other day             Fasting glucose: 140 range -- this morning 122             Post prandial:             Evening:             Before meals: Taking Insulin?: no             Long acting insulin:             Short acting insulin: Blood Pressure Monitoring: daily Retinal Examination: Not Up to Date Foot Exam: Up to Date Pneumovax: Up to Date Influenza: Up to Date, at [place] received it Aspirin: no   HYPERTENSION / HYPERLIPIDEMIA Continues on Lisinopril and Atorvastatin.  Echo in 2017 with EF 60-65%, mild to moderately calcified aortic leaflets.  Saw cardiology in 2017 and has not returned, but reports a known heart murmur.  Uses CPAP 100% at home for OSA.  Satisfied with current treatment? yes Duration of hypertension: chronic BP monitoring frequency: rarely BP range: 120/70's BP medication side effects: no Duration of hyperlipidemia: chronic Cholesterol medication side effects: no Cholesterol supplements: none Medication compliance: good compliance Aspirin: no Recent stressors: no Recurrent headaches: no Visual changes: no Palpitations: no Dyspnea: no Chest pain: no Lower extremity edema: no Dizzy/lightheaded: no   CHRONIC KIDNEY DISEASE GFR in August 2020 39 with CRT 1.78.   Sees nephrology at [facility].  Goes once a year to see them.   CKD status: stable Medications renally dose: yes Previous renal evaluation: yes Pneumovax:  Up to Date Influenza Vaccine:  Up to Date   Relevant past medical, surgical, family and social history reviewed and updated as indicated. Interim medical history since our last visit reviewed. Allergies and medications reviewed and updated.  Review of Systems  Constitutional: Negative for activity change, diaphoresis, fatigue and fever.  Respiratory: Negative for cough, chest tightness, shortness of breath and wheezing.   Cardiovascular: Negative for chest pain, palpitations and leg swelling.  Gastrointestinal: Negative.   Endocrine: Negative for polydipsia, polyphagia and polyuria.  Neurological: Negative.   Psychiatric/Behavioral: Negative.     Per HPI unless specifically indicated above     Objective:    BP 132/69   Pulse 75   Temp 98.2 F (36.8 C) (Oral)   Wt 217 lb 3.2 oz (98.5 kg)   SpO2 96%   BMI 36.52 kg/m   Wt Readings from Last 3 Encounters:  03/28/20 217 lb 3.2 oz (98.5 kg)  12/07/19 219 lb 6.4 oz (99.5 kg)  06/25/19 214 lb (97.1 kg)  Physical Exam Vitals and nursing note reviewed.  Constitutional:      General: He is awake. He is not in acute distress.    Appearance: He is well-developed and well-groomed. He is morbidly obese. He is not ill-appearing.  HENT:     Head: Normocephalic and atraumatic.     Right Ear: Hearing normal. No drainage.     Left Ear: Hearing normal. No drainage.  Eyes:     General: Lids are normal.        Right eye: No discharge.        Left eye: No discharge.     Conjunctiva/sclera: Conjunctivae normal.     Pupils: Pupils are equal, round, and reactive to light.  Neck:     Vascular: No carotid bruit.  Cardiovascular:     Rate and Rhythm: Normal rate and regular rhythm.     Heart sounds: S1 normal and S2 normal. Murmur present. Systolic murmur present with a grade of 2/6. No  gallop.   Pulmonary:     Effort: Pulmonary effort is normal. No accessory muscle usage or respiratory distress.     Breath sounds: Normal breath sounds.  Abdominal:     General: Bowel sounds are normal.     Palpations: Abdomen is soft.  Musculoskeletal:        General: Normal range of motion.     Cervical back: Normal range of motion and neck supple.     Right lower leg: No edema.     Left lower leg: No edema.  Skin:    General: Skin is warm and dry.     Capillary Refill: Capillary refill takes less than 2 seconds.  Neurological:     Mental Status: He is alert and oriented to person, place, and time.  Psychiatric:        Attention and Perception: Attention normal.        Mood and Affect: Mood normal.        Speech: Speech normal.        Behavior: Behavior normal. Behavior is cooperative.        Thought Content: Thought content normal.     Results for orders placed or performed in visit on 12/07/19  Bayer DCA Hb A1c Waived  Result Value Ref Range   HB A1C (BAYER DCA - WAIVED) 7.0 (H) <7.0 %  Microalbumin, Urine Waived here  Result Value Ref Range   Microalb, Ur Waived 30 (H) 0 - 19 mg/L   Creatinine, Urine Waived 50 10 - 300 mg/dL   Microalb/Creat Ratio 30-300 (H) <30 mg/g  CBC with Differential/Platelet out  Result Value Ref Range   WBC 6.5 3.4 - 10.8 x10E3/uL   RBC 4.38 4.14 - 5.80 x10E6/uL   Hemoglobin 13.4 13.0 - 17.7 g/dL   Hematocrit 40.2 37.5 - 51.0 %   MCV 92 79 - 97 fL   MCH 30.6 26.6 - 33.0 pg   MCHC 33.3 31.5 - 35.7 g/dL   RDW 14.2 11.6 - 15.4 %   Platelets 161 150 - 450 x10E3/uL   Neutrophils 62 Not Estab. %   Lymphs 22 Not Estab. %   Monocytes 9 Not Estab. %   Eos 6 Not Estab. %   Basos 1 Not Estab. %   Neutrophils Absolute 4.0 1.4 - 7.0 x10E3/uL   Lymphocytes Absolute 1.5 0.7 - 3.1 x10E3/uL   Monocytes Absolute 0.6 0.1 - 0.9 x10E3/uL   EOS (ABSOLUTE) 0.4 0.0 - 0.4 x10E3/uL   Basophils Absolute 0.1  0.0 - 0.2 x10E3/uL   Immature Granulocytes 0 Not  Estab. %   Immature Grans (Abs) 0.0 0.0 - 0.1 x10E3/uL  Comprehensive metabolic panel  Result Value Ref Range   Glucose 130 (H) 65 - 99 mg/dL   BUN 26 8 - 27 mg/dL   Creatinine, Ser 1.87 (H) 0.76 - 1.27 mg/dL   GFR calc non Af Amer 36 (L) >59 mL/min/1.73   GFR calc Af Amer 42 (L) >59 mL/min/1.73   BUN/Creatinine Ratio 14 10 - 24   Sodium 139 134 - 144 mmol/L   Potassium 4.4 3.5 - 5.2 mmol/L   Chloride 105 96 - 106 mmol/L   CO2 20 20 - 29 mmol/L   Calcium 9.3 8.6 - 10.2 mg/dL   Total Protein 7.0 6.0 - 8.5 g/dL   Albumin 4.3 3.8 - 4.8 g/dL   Globulin, Total 2.7 1.5 - 4.5 g/dL   Albumin/Globulin Ratio 1.6 1.2 - 2.2   Bilirubin Total 0.3 0.0 - 1.2 mg/dL   Alkaline Phosphatase 71 39 - 117 IU/L   AST 24 0 - 40 IU/L   ALT 26 0 - 44 IU/L  Lipid Panel w/o Chol/HDL Ratio out  Result Value Ref Range   Cholesterol, Total 150 100 - 199 mg/dL   Triglycerides 172 (H) 0 - 149 mg/dL   HDL 34 (L) >39 mg/dL   VLDL Cholesterol Cal 30 5 - 40 mg/dL   LDL Chol Calc (NIH) 86 0 - 99 mg/dL  PSA  Result Value Ref Range   Prostate Specific Ag, Serum 0.2 0.0 - 4.0 ng/mL  TSH  Result Value Ref Range   TSH 2.760 0.450 - 4.500 uIU/mL  Magnesium  Result Value Ref Range   Magnesium 2.1 1.6 - 2.3 mg/dL      Assessment & Plan:   Problem List Items Addressed This Visit      Cardiovascular and Mediastinum   Aortic valve calcification    Stable with no symptoms.  Continue to monitor and return to cardiology as needed.      Hypertension associated with diabetes ([place])    Chronic, stable with BP at goal in office and home readings.  Continue current medication regimen and adjust as needed.  Recommend checking BP at least three times a week at home.  BMP next visit.        Respiratory   OSA on CPAP    100% use of CPAP at home, continue adherent regimen.        Endocrine   Hyperlipidemia associated with type 2 diabetes mellitus (HCC)    Chronic, ongoing.  Continue current medication regimen and  adjust as needed.  Lipid panel next visit.      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  Continue current medication regimen and adjust as needed.  He wishes to focus on diet and exercise, as in past when slight elevation A1C he reports he brought it down this way.  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  Monitor use of Glimepiride due to age and if BS consistently <70 consider alternate options.  Check BS at home as least three mornings a week.  Return in 3 months.       Relevant Orders   Bayer DCA Hb A1c Waived     Genitourinary   CKD (chronic kidney disease), stage III    Chronic, ongoing.  Monitored by nephrology at [facility], continue collaboration and Lisinopril  at this time.  BMP next visit.        Other   Morbid obesity ([place])    Recommended eating smaller high protein, low fat meals more frequently and exercising 30 mins a day 5 times a week with a goal of 10-15lb weight loss in the next 3 months. Patient voiced their understanding and motivation to adhere to these recommendations.       BMI 36.0-36.9,adult    Refer to morbid obesity plan, recommend focus on diet changes and regular exercise.          Follow up plan: Return in about 3 months (around 06/28/2020) for T2DM, HTN/HLD.

## 2020-05-16 ENCOUNTER — Telehealth: Payer: Self-pay | Admitting: Family Medicine

## 2020-05-16 NOTE — Telephone Encounter (Signed)
Contacted patient to try to re-schedule AWVS- patient declined stating he does not feel he needs this type of visit and politely asked to not be contacted again in reference to it

## 2020-06-06 ENCOUNTER — Telehealth: Payer: Self-pay

## 2020-06-06 ENCOUNTER — Ambulatory Visit: Payer: Medicare Other | Admitting: Family Medicine

## 2020-06-06 ENCOUNTER — Ambulatory Visit (INDEPENDENT_AMBULATORY_CARE_PROVIDER_SITE_OTHER): Payer: Medicare Other | Admitting: Family Medicine

## 2020-06-06 ENCOUNTER — Encounter: Payer: Self-pay | Admitting: Family Medicine

## 2020-06-06 ENCOUNTER — Other Ambulatory Visit: Payer: Self-pay

## 2020-06-06 VITALS — BP 115/69 | HR 82 | Temp 98.5°F | Wt 217.0 lb

## 2020-06-06 DIAGNOSIS — R1032 Left lower quadrant pain: Secondary | ICD-10-CM

## 2020-06-06 DIAGNOSIS — Z1211 Encounter for screening for malignant neoplasm of colon: Secondary | ICD-10-CM | POA: Diagnosis not present

## 2020-06-06 LAB — CBC WITH DIFFERENTIAL/PLATELET
Hematocrit: 38.2 % (ref 37.5–51.0)
Hemoglobin: 12.9 g/dL — ABNORMAL LOW (ref 13.0–17.7)
Lymphocytes Absolute: 1.7 10*3/uL (ref 0.7–3.1)
Lymphs: 25 %
MCH: 30.5 pg (ref 26.6–33.0)
MCHC: 33.8 g/dL (ref 31.5–35.7)
MCV: 90 fL (ref 79–97)
MID (Absolute): 0.9 10*3/uL (ref 0.1–1.6)
MID: 13 %
Neutrophils Absolute: 4.2 10*3/uL (ref 1.4–7.0)
Neutrophils: 62 %
Platelets: 165 10*3/uL (ref 150–450)
RBC: 4.23 x10E6/uL (ref 4.14–5.80)
RDW: 15 % (ref 11.6–15.4)
WBC: 6.8 10*3/uL (ref 3.4–10.8)

## 2020-06-06 MED ORDER — CIPROFLOXACIN HCL 500 MG PO TABS: 500.0000 mg | ORAL_TABLET | Freq: Two times a day (BID) | ORAL | 0 refills | Status: DC

## 2020-06-06 MED ORDER — METRONIDAZOLE 500 MG PO TABS: 500.0000 mg | ORAL_TABLET | Freq: Three times a day (TID) | ORAL | 0 refills | Status: DC

## 2020-06-06 NOTE — Progress Notes (Signed)
BP 115/69   Pulse 82   Temp 98.5 F (36.9 C) (Oral)   Wt 217 lb (98.4 kg)   SpO2 95%   BMI 36.49 kg/m    Subjective:    Patient ID: Thomas Huang, male    DOB: [birth year], 69 y.o.   MRN: [id number]  HPI: Thomas Huang is a 69 y.o. male  Chief Complaint  Patient presents with  . Abdominal Pain    left lower side since last Thursday. pt states has had some left over antibotics amox-clav 875 mg tab and took them f2 tabs Sat and 2 tabs Sund   Presenting today with about a week of LLQ pain that is strong and cramping, nearly constant and worse with movement. Hx of diverticulitis and states this is how it's always felt. This will be his 4th flare since age 89 with LLQ pain suspected to be diverticulitis. Denies diarrhea, fever, constipation. Had some abx leftover at home augmentin (2 doses the past 2 days). Overdue for colonoscopy.   Has gone through [facility] in the past for colonoscopy.   Relevant past medical, surgical, family and social history reviewed and updated as indicated. Interim medical history since our last visit reviewed. Allergies and medications reviewed and updated.  Review of Systems  Per HPI unless specifically indicated above     Objective:    BP 115/69   Pulse 82   Temp 98.5 F (36.9 C) (Oral)   Wt 217 lb (98.4 kg)   SpO2 95%   BMI 36.49 kg/m   Wt Readings from Last 3 Encounters:  06/06/20 217 lb (98.4 kg)  03/28/20 217 lb 3.2 oz (98.5 kg)  12/07/19 219 lb 6.4 oz (99.5 kg)    Physical Exam Vitals and nursing note reviewed.  Constitutional:      Appearance: Normal appearance.  HENT:     Head: Atraumatic.  Eyes:     Extraocular Movements: Extraocular movements intact.     Conjunctiva/sclera: Conjunctivae normal.  Cardiovascular:     Rate and Rhythm: Normal rate and regular rhythm.  Pulmonary:     Effort: Pulmonary effort is normal.     Breath sounds: Normal breath sounds.  Abdominal:     General: Bowel sounds are normal. There is no  distension.     Palpations: Abdomen is soft.     Tenderness: There is abdominal tenderness (mild lower abdominal ttp). There is no right CVA tenderness, left CVA tenderness, guarding or rebound.  Musculoskeletal:        General: Normal range of motion.     Cervical back: Normal range of motion and neck supple.  Skin:    General: Skin is warm and dry.  Neurological:     General: No focal deficit present.     Mental Status: He is oriented to person, place, and time.  Psychiatric:        Mood and Affect: Mood normal.        Thought Content: Thought content normal.        Judgment: Judgment normal.     Results for orders placed or performed in visit on 06/06/20  CBC With Differential/Platelet  Result Value Ref Range   WBC 6.8 3.4 - 10.8 x10E3/uL   RBC 4.23 4.14 - 5.80 x10E6/uL   Hemoglobin 12.9 (L) 13.0 - 17.7 g/dL   Hematocrit 38.2 37.5 - 51.0 %   MCV 90 79 - 97 fL   MCH 30.5 26.6 - 33.0 pg   MCHC 33.8  31 - 35 g/dL   RDW 15.0 11.6 - 15.4 %   Platelets 165 150 - 450 x10E3/uL   Neutrophils 62 Not Estab. %   Lymphs 25 Not Estab. %   MID 13 Not Estab. %   Neutrophils Absolute 4.2 1 - 7 x10E3/uL   Lymphocytes Absolute 1.7 0 - 3 x10E3/uL   MID (Absolute) 0.9 0.1 - 1.6 X10E3/uL      Assessment & Plan:   Problem List Items Addressed This Visit    None    Visit Diagnoses    LLQ pain    -  Primary   Will tx for diverticulitis, CBC stable, U/A neg, declines imaging. Referral placed to GI for colonoscopy and further eval of potential recurrent diverticulitis   Relevant Orders   CBC With Differential/Platelet (Completed)   Ambulatory referral to Gastroenterology   Screening for colon cancer       Relevant Orders   Ambulatory referral to Gastroenterology       Follow up plan: Return if symptoms worsen or fail to improve.

## 2020-06-06 NOTE — Telephone Encounter (Signed)
Called patient to let him know that his blood count came back normal, per Merrie Roof, PA-C

## 2020-06-15 DIAGNOSIS — D379 Neoplasm of uncertain behavior of digestive organ, unspecified: Secondary | ICD-10-CM | POA: Diagnosis not present

## 2020-06-15 DIAGNOSIS — K862 Cyst of pancreas: Secondary | ICD-10-CM | POA: Diagnosis not present

## 2020-06-15 DIAGNOSIS — N200 Calculus of kidney: Secondary | ICD-10-CM | POA: Diagnosis not present

## 2020-07-04 ENCOUNTER — Ambulatory Visit: Payer: Medicare Other | Admitting: Nurse Practitioner

## 2020-07-05 ENCOUNTER — Encounter: Payer: Self-pay | Admitting: Nurse Practitioner

## 2020-07-05 ENCOUNTER — Other Ambulatory Visit: Payer: Self-pay

## 2020-07-05 ENCOUNTER — Ambulatory Visit (INDEPENDENT_AMBULATORY_CARE_PROVIDER_SITE_OTHER): Payer: Medicare Other | Admitting: Nurse Practitioner

## 2020-07-05 VITALS — BP 112/68 | HR 77 | Temp 98.7°F | Wt 215.0 lb

## 2020-07-05 DIAGNOSIS — E1169 Type 2 diabetes mellitus with other specified complication: Secondary | ICD-10-CM

## 2020-07-05 DIAGNOSIS — Z6836 Body mass index (BMI) 36.0-36.9, adult: Secondary | ICD-10-CM

## 2020-07-05 DIAGNOSIS — I10 Essential (primary) hypertension: Secondary | ICD-10-CM

## 2020-07-05 DIAGNOSIS — K862 Cyst of pancreas: Secondary | ICD-10-CM | POA: Diagnosis not present

## 2020-07-05 DIAGNOSIS — N1832 Chronic kidney disease, stage 3b: Secondary | ICD-10-CM

## 2020-07-05 DIAGNOSIS — E785 Hyperlipidemia, unspecified: Secondary | ICD-10-CM

## 2020-07-05 DIAGNOSIS — I359 Nonrheumatic aortic valve disorder, unspecified: Secondary | ICD-10-CM | POA: Diagnosis not present

## 2020-07-05 DIAGNOSIS — E0821 Diabetes mellitus due to underlying condition with diabetic nephropathy: Secondary | ICD-10-CM

## 2020-07-05 DIAGNOSIS — G4733 Obstructive sleep apnea (adult) (pediatric): Secondary | ICD-10-CM | POA: Diagnosis not present

## 2020-07-05 DIAGNOSIS — Z9989 Dependence on other enabling machines and devices: Secondary | ICD-10-CM

## 2020-07-05 DIAGNOSIS — E1159 Type 2 diabetes mellitus with other circulatory complications: Secondary | ICD-10-CM | POA: Diagnosis not present

## 2020-07-05 DIAGNOSIS — I152 Hypertension secondary to endocrine disorders: Secondary | ICD-10-CM

## 2020-07-05 DIAGNOSIS — K219 Gastro-esophageal reflux disease without esophagitis: Secondary | ICD-10-CM | POA: Diagnosis not present

## 2020-07-05 LAB — BAYER DCA HB A1C WAIVED: HB A1C (BAYER DCA - WAIVED): 6.8 % (ref <7.0)

## 2020-07-05 MED ORDER — GLIMEPIRIDE 2 MG PO TABS: 2.0000 mg | ORAL_TABLET | Freq: Two times a day (BID) | ORAL | 4 refills | Status: DC

## 2020-07-05 MED ORDER — FLUTICASONE PROPIONATE 50 MCG/ACT NA SUSP: NASAL | 12 refills | Status: DC

## 2020-07-05 NOTE — Assessment & Plan Note (Signed)
Refer to morbid obesity plan, recommend focus on diet changes and regular exercise. 

## 2020-07-05 NOTE — Assessment & Plan Note (Signed)
Chronic, ongoing.  Continue current medication regimen and adjust as needed.  Lipid panel today.

## 2020-07-05 NOTE — Assessment & Plan Note (Signed)
Monitored by [facility] providers, continue collaboration.

## 2020-07-05 NOTE — Assessment & Plan Note (Signed)
Chronic, stable with Prilosec use.  Continue current regimen and check Mag level annually.

## 2020-07-05 NOTE — Progress Notes (Signed)
BP 112/68   Pulse 77   Temp 98.7 F (37.1 C) (Oral)   Wt 215 lb (97.5 kg)   SpO2 96%   BMI 36.15 kg/m    Subjective:    Patient ID: Thomas Huang, male    DOB: 07-Jun-1951, 69 y.o.   MRN: [id number]  HPI: Thomas Huang is a 69 y.o. male  Chief Complaint  Patient presents with  . Diabetes    no recent eye exam per patient   . Hyperlipidemia  . Hypertension  . Gastroesophageal Reflux    pt states he has had a slight pain right at the base of his esophagus since finishing antibiotics last month, states omeprazole does helo some    DIABETES A1C in May 7.1%.  Continues on Glimepiride -- has been taking twice a day for the past 1 1/2 weeks which has helped sugar levels improve.  He reports usually he can cut out deserts and this can bring it back down, has not been exercising as usually does.   Hypoglycemic episodes:no Polydipsia/polyuria: no Visual disturbance: no Chest pain: no Paresthesias: no Glucose Monitoring: yes             Accucheck frequency: daily -- has been checking 3 times a day             Fasting glucose: 80-120 since adjusting medications             Post prandial:             Evening:             Before meals: 120 range Taking Insulin?: no             Long acting insulin:             Short acting insulin: Blood Pressure Monitoring: daily Retinal Examination: Not Up to Date Foot Exam: Up to Date Pneumovax: Up to Date Influenza: Up to Date, at [place] received it Aspirin: no   HYPERTENSION / HYPERLIPIDEMIA Continues on Lisinopril and Atorvastatin.  Echo in 2017 with EF 60-65%, mild to moderately calcified aortic leaflets.  Saw cardiology in 2017 and has not returned, but reports a known heart murmur.  Uses CPAP 100% at home for OSA.  Satisfied with current treatment? yes Duration of hypertension: chronic BP monitoring frequency: rarely BP range: < 130/80 on average BP medication side effects: no Duration of hyperlipidemia: chronic Cholesterol  medication side effects: no Cholesterol supplements: none Medication compliance: good compliance Aspirin: no Recent stressors: no Recurrent headaches: no Visual changes: no Palpitations: no Dyspnea: no Chest pain: no Lower extremity edema: no Dizzy/lightheaded: no   CHRONIC KIDNEY DISEASE GFR in August 2020 39 with CRT 1.78.  Sees nephrology at [facility].  Goes once a year to see them.   CKD status: stable Medications renally dose: yes Previous renal evaluation: yes Pneumovax:  Up to Date Influenza Vaccine:  Up to Date   GERD Started after taking abx recently, Flagyl for 10 days.  Has had similar issue before with abx.  Has been taking Prilosec, started 1 1/2 weeks ago, which is helping.  Is followed by oncology for pancreas cyst, last saw 06/15/20, imaging done and plan to follow-up in one year.   GERD control status: stable  Satisfied with current treatment? yes Heartburn frequency: intermittent Medication side effects: no  Medication compliance: stable Previous GERD medications: Prilosec Antacid use frequency:  none Dysphagia: no Odynophagia:  no Hematemesis: no Blood in stool: no EGD:  no  Relevant past medical, surgical, family and social history reviewed and updated as indicated. Interim medical history since our last visit reviewed. Allergies and medications reviewed and updated.  Review of Systems  Constitutional: Negative for activity change, diaphoresis, fatigue and fever.  Respiratory: Negative for cough, chest tightness, shortness of breath and wheezing.   Cardiovascular: Negative for chest pain, palpitations and leg swelling.  Gastrointestinal: Negative.   Endocrine: Negative for polydipsia, polyphagia and polyuria.  Neurological: Negative.   Psychiatric/Behavioral: Negative.     Per HPI unless specifically indicated above     Objective:    BP 112/68   Pulse 77   Temp 98.7 F (37.1 C) (Oral)   Wt 215 lb (97.5 kg)   SpO2 96%   BMI 36.15 kg/m   Wt  Readings from Last 3 Encounters:  07/05/20 215 lb (97.5 kg)  06/06/20 217 lb (98.4 kg)  03/28/20 217 lb 3.2 oz (98.5 kg)    Physical Exam Vitals and nursing note reviewed.  Constitutional:      General: He is awake. He is not in acute distress.    Appearance: He is well-developed and well-groomed. He is morbidly obese. He is not ill-appearing.  HENT:     Head: Normocephalic and atraumatic.     Right Ear: Hearing normal. No drainage.     Left Ear: Hearing normal. No drainage.  Eyes:     General: Lids are normal.        Right eye: No discharge.        Left eye: No discharge.     Conjunctiva/sclera: Conjunctivae normal.     Pupils: Pupils are equal, round, and reactive to light.  Neck:     Vascular: No carotid bruit.  Cardiovascular:     Rate and Rhythm: Normal rate and regular rhythm.     Heart sounds: S1 normal and S2 normal. Murmur heard.  Systolic murmur is present with a grade of 2/6.  No gallop.   Pulmonary:     Effort: Pulmonary effort is normal. No accessory muscle usage or respiratory distress.     Breath sounds: Normal breath sounds.  Abdominal:     General: Bowel sounds are normal.     Palpations: Abdomen is soft.  Musculoskeletal:        General: Normal range of motion.     Cervical back: Normal range of motion and neck supple.     Right lower leg: No edema.     Left lower leg: No edema.  Skin:    General: Skin is warm and dry.     Capillary Refill: Capillary refill takes less than 2 seconds.  Neurological:     Mental Status: He is alert and oriented to person, place, and time.  Psychiatric:        Attention and Perception: Attention normal.        Mood and Affect: Mood normal.        Speech: Speech normal.        Behavior: Behavior normal. Behavior is cooperative.        Thought Content: Thought content normal.    Results for orders placed or performed in visit on 06/06/20  CBC With Differential/Platelet  Result Value Ref Range   WBC 6.8 3.4 - 10.8  x10E3/uL   RBC 4.23 4.14 - 5.80 x10E6/uL   Hemoglobin 12.9 (L) 13.0 - 17.7 g/dL   Hematocrit 38.2 37.5 - 51.0 %   MCV 90 79 - 97 fL   MCH  30.5 26.6 - 33.0 pg   MCHC 33.8 31 - 35 g/dL   RDW 15.0 11.6 - 15.4 %   Platelets 165 150 - 450 x10E3/uL   Neutrophils 62 Not Estab. %   Lymphs 25 Not Estab. %   MID 13 Not Estab. %   Neutrophils Absolute 4.2 1 - 7 x10E3/uL   Lymphocytes Absolute 1.7 0 - 3 x10E3/uL   MID (Absolute) 0.9 0.1 - 1.6 X10E3/uL      Assessment & Plan:   Problem List Items Addressed This Visit      Cardiovascular and Mediastinum   Aortic valve calcification    Stable with no symptoms.  Continue to monitor and return to cardiology as needed.      Hypertension associated with diabetes ([place])    Chronic, stable with BP at goal in office and home readings.  Continue current medication regimen and adjust as needed.  Recommend checking BP at least three times a week at home.  Focus on DASH diet.  BMP today.  Return in 3 months.      Relevant Medications   glimepiride (AMARYL) 2 MG tablet   Other Relevant Orders   Bayer DCA Hb A1c Waived   Basic metabolic panel     Respiratory   OSA on CPAP    100% use of CPAP at home, continue adherent regimen.        Digestive   GERD (gastroesophageal reflux disease)    Chronic, stable with Prilosec use.  Continue current regimen and check Mag level annually.      Pancreatic cyst    Monitored by [facility] providers, continue collaboration.        Endocrine   Hyperlipidemia associated with type 2 diabetes mellitus (HCC)    Chronic, ongoing.  Continue current medication regimen and adjust as needed.  Lipid panel today.      Relevant Medications   glimepiride (AMARYL) 2 MG tablet   Other Relevant Orders   Bayer DCA Hb A1c Waived   Lipid Panel w/o Chol/HDL Ratio   Diabetes mellitus with renal manifestation (HCC) - Primary    Chronic, ongoing with A1C 6.8% today, improving.  Continue current medication regimen and adjust as  needed -- he self adjust to Glimepiride 2 MG daily, discussed with him to closely monitor BS with this and notify provider if consistent <70.  He wishes to focus on diet and exercise, as in past when slight elevation A1C he reports he brought it down this way.  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  Monitor use of Glimepiride due to age and if BS consistently <70 consider alternate options.  Check BS at home as least three mornings a week.  Return in 3 months.       Relevant Medications   glimepiride (AMARYL) 2 MG tablet   Other Relevant Orders   Bayer DCA Hb A1c Waived   Basic metabolic panel     Genitourinary   CKD (chronic kidney disease), stage III    Chronic, ongoing.  Monitored by nephrology at [facility], continue collaboration and Lisinopril at this time.  BMP today.        Other   Morbid obesity (HCC)    BMI 36.15.  Recommended eating smaller high protein, low fat meals more frequently and exercising 30 mins a day 5 times a week with a goal of 10-15lb weight loss in the next 3 months. Patient voiced their understanding and motivation to adhere to these recommendations.  Relevant Medications   glimepiride (AMARYL) 2 MG tablet   BMI 36.0-36.9,adult    Refer to morbid obesity plan, recommend focus on diet changes and regular exercise.          Follow up plan: Return in about 3 months (around 10/05/2020) for T2DM, HTN/HLD, CKD.

## 2020-07-05 NOTE — Assessment & Plan Note (Signed)
100% use of CPAP at home, continue adherent regimen. 

## 2020-07-05 NOTE — Assessment & Plan Note (Signed)
Chronic, ongoing with A1C 6.8% today, improving.  Continue current medication regimen and adjust as needed -- he self adjust to Glimepiride 2 MG daily, discussed with him to closely monitor BS with this and notify provider if consistent <70.  He wishes to focus on diet and exercise, as in past when slight elevation A1C he reports he brought it down this way.  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  Monitor use of Glimepiride due to age and if BS consistently <70 consider alternate options.  Check BS at home as least three mornings a week.  Return in 3 months.

## 2020-07-05 NOTE — Patient Instructions (Signed)

## 2020-07-05 NOTE — Assessment & Plan Note (Signed)
Chronic, stable with BP at goal in office and home readings.  Continue current medication regimen and adjust as needed.  Recommend checking BP at least three times a week at home.  Focus on DASH diet.  BMP today.  Return in 3 months.

## 2020-07-05 NOTE — Assessment & Plan Note (Signed)
Stable with no symptoms.  Continue to monitor and return to cardiology as needed. 

## 2020-07-05 NOTE — Assessment & Plan Note (Signed)
Chronic, ongoing.  Monitored by nephrology at [facility], continue collaboration and Lisinopril at this time.  BMP today.

## 2020-07-05 NOTE — Assessment & Plan Note (Signed)
BMI 36.15.  Recommended eating smaller high protein, low fat meals more frequently and exercising 30 mins a day 5 times a week with a goal of 10-15lb weight loss in the next 3 months. Patient voiced their understanding and motivation to adhere to these recommendations. ° °

## 2020-07-06 LAB — BASIC METABOLIC PANEL
BUN/Creatinine Ratio: 12 (ref 10–24)
BUN: 26 mg/dL (ref 8–27)
CO2: 20 mmol/L (ref 20–29)
Calcium: 9.1 mg/dL (ref 8.6–10.2)
Chloride: 109 mmol/L — ABNORMAL HIGH (ref 96–106)
Creatinine, Ser: 2.15 mg/dL — ABNORMAL HIGH (ref 0.76–1.27)
GFR calc Af Amer: 36 mL/min/{1.73_m2} — ABNORMAL LOW (ref >59)
GFR calc non Af Amer: 31 mL/min/{1.73_m2} — ABNORMAL LOW (ref >59)
Glucose: 128 mg/dL — ABNORMAL HIGH (ref 65–99)
Potassium: 4.7 mmol/L (ref 3.5–5.2)
Sodium: 143 mmol/L (ref 134–144)

## 2020-07-06 LAB — LIPID PANEL W/O CHOL/HDL RATIO
Cholesterol, Total: 130 mg/dL (ref 100–199)
HDL: 31 mg/dL — ABNORMAL LOW (ref >39)
LDL Chol Calc (NIH): 72 mg/dL (ref 0–99)
Triglycerides: 154 mg/dL — ABNORMAL HIGH (ref 0–149)
VLDL Cholesterol Cal: 27 mg/dL (ref 5–40)

## 2020-07-06 NOTE — Progress Notes (Signed)
Contacted via [place] [facility], your labs have returned.  Continue to visit with your kidney doctor for monitoring of kidney disease.  Cholesterol levels look good, continue Atorvastatin daily.  If any questions let me know.  Have a great day!! Keep being awesome!! Kindest regards, Garrin Kirwan

## 2020-07-31 DIAGNOSIS — N200 Calculus of kidney: Secondary | ICD-10-CM | POA: Diagnosis not present

## 2020-07-31 DIAGNOSIS — E1122 Type 2 diabetes mellitus with diabetic chronic kidney disease: Secondary | ICD-10-CM | POA: Diagnosis not present

## 2020-07-31 DIAGNOSIS — N183 Chronic kidney disease, stage 3 unspecified: Secondary | ICD-10-CM | POA: Diagnosis not present

## 2020-07-31 DIAGNOSIS — R109 Unspecified abdominal pain: Secondary | ICD-10-CM | POA: Diagnosis not present

## 2020-07-31 DIAGNOSIS — R1031 Right lower quadrant pain: Secondary | ICD-10-CM | POA: Diagnosis not present

## 2020-07-31 DIAGNOSIS — Z7984 Long term (current) use of oral hypoglycemic drugs: Secondary | ICD-10-CM | POA: Diagnosis not present

## 2020-07-31 DIAGNOSIS — R110 Nausea: Secondary | ICD-10-CM | POA: Diagnosis not present

## 2020-07-31 DIAGNOSIS — Z79899 Other long term (current) drug therapy: Secondary | ICD-10-CM | POA: Diagnosis not present

## 2020-07-31 DIAGNOSIS — E669 Obesity, unspecified: Secondary | ICD-10-CM | POA: Diagnosis not present

## 2020-07-31 DIAGNOSIS — I129 Hypertensive chronic kidney disease with stage 1 through stage 4 chronic kidney disease, or unspecified chronic kidney disease: Secondary | ICD-10-CM | POA: Diagnosis not present

## 2020-07-31 DIAGNOSIS — D631 Anemia in chronic kidney disease: Secondary | ICD-10-CM | POA: Diagnosis not present

## 2020-07-31 DIAGNOSIS — K7689 Other specified diseases of liver: Secondary | ICD-10-CM | POA: Diagnosis not present

## 2020-07-31 DIAGNOSIS — N132 Hydronephrosis with renal and ureteral calculous obstruction: Secondary | ICD-10-CM | POA: Diagnosis not present

## 2020-08-09 ENCOUNTER — Ambulatory Visit (INDEPENDENT_AMBULATORY_CARE_PROVIDER_SITE_OTHER): Payer: Medicare Other | Admitting: Podiatry

## 2020-08-09 ENCOUNTER — Other Ambulatory Visit: Payer: Self-pay

## 2020-08-09 ENCOUNTER — Ambulatory Visit (INDEPENDENT_AMBULATORY_CARE_PROVIDER_SITE_OTHER): Payer: Medicare Other

## 2020-08-09 DIAGNOSIS — M7660 Achilles tendinitis, unspecified leg: Secondary | ICD-10-CM | POA: Diagnosis not present

## 2020-08-09 DIAGNOSIS — M7661 Achilles tendinitis, right leg: Secondary | ICD-10-CM | POA: Diagnosis not present

## 2020-08-09 MED ORDER — METHYLPREDNISOLONE 4 MG PO TBPK: ORAL_TABLET | ORAL | 0 refills | Status: DC

## 2020-08-09 NOTE — Progress Notes (Signed)
   HPI: 69 y.o. male presenting today as a new patient for evaluation of left Achilles pain.  Patient states that approximately 3-4 days ago he started to notice severe pain in his left Achilles tendon.  He denies injury or trauma to the area.  He does state that he has a pair shoes that rub in the back of the Achilles tendon.  Currently has not done anything for treatment.  He presents for further treatment and evaluation    Past Medical History:  Diagnosis Date  . Arthritis    lower back  . Carpal tunnel syndrome of left wrist   . Chronic kidney disease   . Diabetes mellitus, type 2 ([place])   . GERD (gastroesophageal reflux disease)   . Heart murmur    followed by PCP  . Hyperlipidemia   . Sleep apnea    CPAP  . Vertigo    mild. several episodes/wk      Physical Exam: General: The patient is alert and oriented x3 in no acute distress.  Dermatology: Skin is warm, dry and supple bilateral lower extremities. Negative for open lesions or macerations.  Vascular: Palpable pedal pulses bilaterally. No edema or erythema noted. Capillary refill within normal limits.  Neurological: Epicritic and protective threshold grossly intact bilaterally.   Musculoskeletal Exam: Pain on palpation noted to the mid substance of the Achilles tendon approximately 3-5 cm proximal to the insertion of the calcaneal tubercle. Range of motion within normal limits. Muscle strength 5/5 in all muscle groups bilateral lower extremities.  Negative for any palpable dell.  Radiographic Exam:  Posterior calcaneal spur noted to the respective calcaneus on lateral view. No fracture or dislocation noted. Normal osseous mineralization noted.    There is some irregularity noted within the Achilles tendon on lateral view.  These are some small radiopaque linear disruptions along the mid substance of the Achilles tendon to correlate with the area of pain possibly consistent with a partial rupture or  microtears  Assessment: 1.  Achilles tendinitis left   Plan of Care:  1. Patient was evaluated. Radiographs were reviewed today. 2.  Prescription for Medrol Dosepak 3.  Patient has CKD.  No NSAIDs.  Recommend OTC Tylenol as needed 4.  Cam boot dispensed.  Weightbearing as tolerated x3 weeks 5.  Return to clinic in 3 weeks  *Going to the beach with friends 09/03/2020   Edrick Kins, DPM [facility]  Dr. Edrick Kins, [address]                                        [place], [place] [address]                Office [phone]  Fax [phone]

## 2020-08-11 DIAGNOSIS — N1832 Chronic kidney disease, stage 3b: Secondary | ICD-10-CM | POA: Diagnosis not present

## 2020-08-11 DIAGNOSIS — G4733 Obstructive sleep apnea (adult) (pediatric): Secondary | ICD-10-CM | POA: Diagnosis not present

## 2020-08-11 DIAGNOSIS — Z87442 Personal history of urinary calculi: Secondary | ICD-10-CM | POA: Diagnosis not present

## 2020-08-11 DIAGNOSIS — Z01812 Encounter for preprocedural laboratory examination: Secondary | ICD-10-CM | POA: Diagnosis not present

## 2020-08-11 DIAGNOSIS — Z86010 Personal history of colonic polyps: Secondary | ICD-10-CM | POA: Diagnosis not present

## 2020-08-11 DIAGNOSIS — Z9989 Dependence on other enabling machines and devices: Secondary | ICD-10-CM | POA: Diagnosis not present

## 2020-08-11 DIAGNOSIS — Z8719 Personal history of other diseases of the digestive system: Secondary | ICD-10-CM | POA: Diagnosis not present

## 2020-08-12 DIAGNOSIS — I10 Essential (primary) hypertension: Secondary | ICD-10-CM | POA: Diagnosis not present

## 2020-08-12 DIAGNOSIS — Z794 Long term (current) use of insulin: Secondary | ICD-10-CM | POA: Diagnosis not present

## 2020-08-12 DIAGNOSIS — E1122 Type 2 diabetes mellitus with diabetic chronic kidney disease: Secondary | ICD-10-CM | POA: Diagnosis not present

## 2020-08-12 DIAGNOSIS — N183 Chronic kidney disease, stage 3 unspecified: Secondary | ICD-10-CM | POA: Diagnosis not present

## 2020-08-16 ENCOUNTER — Other Ambulatory Visit: Payer: Self-pay | Admitting: *Deleted

## 2020-08-16 ENCOUNTER — Encounter: Payer: Self-pay | Admitting: Urology

## 2020-08-16 ENCOUNTER — Ambulatory Visit (INDEPENDENT_AMBULATORY_CARE_PROVIDER_SITE_OTHER): Payer: Medicare Other | Admitting: Urology

## 2020-08-16 ENCOUNTER — Other Ambulatory Visit: Payer: Self-pay

## 2020-08-16 ENCOUNTER — Other Ambulatory Visit
Admission: RE | Admit: 2020-08-16 | Discharge: 2020-08-16 | Disposition: A | Discharge status: Home / Self Care | Payer: Medicare Other | Attending: Urology | Admitting: Urology

## 2020-08-16 VITALS — BP 135/72 | HR 86 | Ht 66.0 in | Wt 212.0 lb

## 2020-08-16 DIAGNOSIS — N529 Male erectile dysfunction, unspecified: Secondary | ICD-10-CM

## 2020-08-16 DIAGNOSIS — Z87442 Personal history of urinary calculi: Secondary | ICD-10-CM | POA: Diagnosis not present

## 2020-08-16 DIAGNOSIS — N200 Calculus of kidney: Secondary | ICD-10-CM

## 2020-08-16 LAB — URINALYSIS, COMPLETE (UACMP) WITH MICROSCOPIC
Bilirubin Urine: NEGATIVE
Glucose, UA: 100 mg/dL — AB
Hgb urine dipstick: NEGATIVE
Ketones, ur: NEGATIVE mg/dL
Leukocytes,Ua: NEGATIVE
Nitrite: NEGATIVE
Protein, ur: NEGATIVE mg/dL
RBC / HPF: NONE SEEN RBC/hpf (ref 0–5)
Specific Gravity, Urine: 1.015 (ref 1.005–1.030)
pH: 5.5 (ref 5.0–8.0)

## 2020-08-16 NOTE — Patient Instructions (Signed)
Dietary Guidelines to Help Prevent Kidney Stones Kidney stones are deposits of minerals and salts that form inside your kidneys. Your risk of developing kidney stones may be greater depending on your diet, your lifestyle, the medicines you take, and whether you have certain medical conditions. Most people can reduce their chances of developing kidney stones by following the instructions below. Depending on your overall health and the type of kidney stones you tend to develop, your dietitian may give you more specific instructions. What are tips for following this plan? Reading food labels  Choose foods with "no salt added" or "low-salt" labels. Limit your sodium intake to less than 1500 mg per day.  Choose foods with calcium for each meal and snack. Try to eat about 300 mg of calcium at each meal. Foods that contain 200-500 mg of calcium per serving include: ? 8 oz (237 ml) of milk, fortified nondairy milk, and fortified fruit juice. ? 8 oz (237 ml) of kefir, yogurt, and soy yogurt. ? 4 oz (118 ml) of tofu. ? 1 oz of cheese. ? 1 cup (300 g) of dried figs. ? 1 cup (91 g) of cooked broccoli. ? 1-3 oz can of sardines or mackerel.  Most people need 1000 to 1500 mg of calcium each day. Talk to your dietitian about how much calcium is recommended for you. Shopping  Buy plenty of fresh fruits and vegetables. Most people do not need to avoid fruits and vegetables, even if they contain nutrients that may contribute to kidney stones.  When shopping for convenience foods, choose: ? Whole pieces of fruit. ? Premade salads with dressing on the side. ? Low-fat fruit and yogurt smoothies.  Avoid buying frozen meals or prepared deli foods.  Look for foods with live cultures, such as yogurt and kefir. Cooking  Do not add salt to food when cooking. Place a salt shaker on the table and allow each person to add his or her own salt to taste.  Use vegetable protein, such as beans, textured vegetable  protein (TVP), or tofu instead of meat in pasta, casseroles, and soups. Meal planning   Eat less salt, if told by your dietitian. To do this: ? Avoid eating processed or premade food. ? Avoid eating fast food.  Eat less animal protein, including cheese, meat, poultry, or fish, if told by your dietitian. To do this: ? Limit the number of times you have meat, poultry, fish, or cheese each week. Eat a diet free of meat at least 2 days a week. ? Eat only one serving each day of meat, poultry, fish, or seafood. ? When you prepare animal protein, cut pieces into small portion sizes. For most meat and fish, one serving is about the size of one deck of cards.  Eat at least 5 servings of fresh fruits and vegetables each day. To do this: ? Keep fruits and vegetables on hand for snacks. ? Eat 1 piece of fruit or a handful of berries with breakfast. ? Have a salad and fruit at lunch. ? Have two kinds of vegetables at dinner.  Limit foods that are high in a substance called oxalate. These include: ? Spinach. ? Rhubarb. ? Beets. ? Potato chips and french fries. ? Nuts.  If you regularly take a diuretic medicine, make sure to eat at least 1-2 fruits or vegetables high in potassium each day. These include: ? Avocado. ? Banana. ? Orange, prune, carrot, or tomato juice. ? Baked potato. ? Cabbage. ? Beans and split   peas. General instructions   Drink enough fluid to keep your urine clear or pale yellow. This is the most important thing you can do.  Talk to your health care provider and dietitian about taking daily supplements. Depending on your health and the cause of your kidney stones, you may be advised: ? Not to take supplements with vitamin C. ? To take a calcium supplement. ? To take a daily probiotic supplement. ? To take other supplements such as magnesium, fish oil, or vitamin B6.  Take all medicines and supplements as told by your health care provider.  Limit alcohol intake to no  more than 1 drink a day for nonpregnant women and 2 drinks a day for men. One drink equals 12 oz of beer, 5 oz of wine, or 1 oz of hard liquor.  Lose weight if told by your health care provider. Work with your dietitian to find strategies and an eating plan that works best for you. What foods are not recommended? Limit your intake of the following foods, or as told by your dietitian. Talk to your dietitian about specific foods you should avoid based on the type of kidney stones and your overall health. Grains Breads. Bagels. Rolls. Baked goods. Salted crackers. Cereal. Pasta. Vegetables Spinach. Rhubarb. Beets. Canned vegetables. Pickles. Olives. Meats and other protein foods Nuts. Nut butters. Large portions of meat, poultry, or fish. Salted or cured meats. Deli meats. Hot dogs. Sausages. Dairy Cheese. Beverages Regular soft drinks. Regular vegetable juice. Seasonings and other foods Seasoning blends with salt. Salad dressings. Canned soups. Soy sauce. Ketchup. Barbecue sauce. Canned pasta sauce. Casseroles. Pizza. Lasagna. Frozen meals. Potato chips. French fries. Summary  You can reduce your risk of kidney stones by making changes to your diet.  The most important thing you can do is drink enough fluid. You should drink enough fluid to keep your urine clear or pale yellow.  Ask your health care provider or dietitian how much protein from animal sources you should eat each day, and also how much salt and calcium you should have each day. This information is not intended to replace advice given to you by your health care provider. Make sure you discuss any questions you have with your health care provider. Document Revised: 03/04/2019 Document Reviewed: 10/23/2016 Elsevier Patient Education  2020 Elsevier Inc.  

## 2020-08-16 NOTE — Progress Notes (Signed)
08/16/20 3:48 PM   Thomas Huang 06-Aug-1951 [id number]  CC: Kidney stones, ED   HPI: I saw Thomas Huang in urology clinic for evaluation of kidney stones.  He is a 69 year old male with CKD who recently presented to [facility] ED with right-sided flank pain, and CT showed moderate right hydronephrosis but no ureteral stones, and there were 2 non-obstructing 1 cm mid and lower pole calyceal stones.  He reports his pain resolved around that time, and he passed a number of stone pieces.  He reports he previously has had uric acid stones in the past, and has a distant history of ureteroscopy with Dr. Bernardo Heater.  He denies any stone events in the last 10 years.  Notably, on CT, the left kidney was significantly atrophic in comparison to the right side. He follows with nephrology closely regarding his CKD.  He denies any gross hematuria or flank pain at this time.  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  He also has a large pancreatic cyst that has been under observation with routine imaging with [facility].   PMH: Past Medical History:  Diagnosis Date  . Arthritis    lower back  . Carpal tunnel syndrome of left wrist   . Chronic kidney disease   . Diabetes mellitus, type 2 ([place])   . GERD (gastroesophageal reflux disease)   . Heart murmur    followed by PCP  . Hyperlipidemia   . Sleep apnea    CPAP  . Vertigo    mild. several episodes/wk    Surgical History: Past Surgical History:  Procedure Laterality Date  . CARPAL TUNNEL RELEASE    . CLOSED REDUCTION NASAL FRACTURE N/A 11/05/2017   Procedure: CLOSED REDUCTION NASAL FRACTURE WITH NASAL TURBINATE COBLATION;  Surgeon: Clyde Canterbury, MD;  Location: [place];  Service: ENT;  Laterality: N/A;  Diabetic - oral meds  . LAMINECTOMY     lumbar, [facility]  . recurrent kidney stones      Family History: Family History  Problem Relation Age of Onset  . Diabetes Mother   . Heart disease Mother   . Cancer Mother        breast and lung  .  Stroke Mother   . Heart attack Mother   . Cancer Father        pancreatic  . Hypertension Brother   . Hyperlipidemia Brother   . Diabetes Brother   . Diabetes Maternal Grandmother   . Diabetes Paternal Grandmother     Social History:  reports that he has never smoked. He has never used smokeless tobacco. He reports that he does not drink alcohol and does not use drugs.  Physical Exam: BP 135/72   Pulse 86   Ht 5\' 6"  (1.676 m)   Wt 212 lb (96.2 kg)   BMI 34.22 kg/m    Constitutional:  Alert and oriented, No acute distress. Cardiovascular: No clubbing, cyanosis, or edema. Respiratory: Normal respiratory effort, no increased work of breathing. GI: Abdomen is soft, nontender, nondistended, no abdominal masses GU: No CVA tenderness  Laboratory Data: Reviewed, see HPI  Pertinent Imaging: I personally reviewed the CT in [facility] dated 07/31/2020 showing moderate right-sided hydronephrosis and perinephric stranding but no ureteral stones, and two 1 cm nonobstructing renal stones.  Assessment & Plan:   In summary, is a 69 year old male with CKD and an atrophic left kidney presented to [facility] in early September with likely a spontaneously passed right ureteral stone.  He denies any complaints  today.  We discussed the size differential and his kidneys on the CT, and the importance of immediate follow-up if he were to develop right-sided flank pain and anuria.  I recommended an ultrasound to rule out any persistent right-sided hydronephrosis, as well as to evaluate stone burden since he passed a number of small stones over the last few weeks.  We will call with ultrasound results  We discussed general stone prevention strategies including adequate hydration with goal of producing 2.5 L of urine daily, increasing citric acid intake, increasing calcium intake during high oxalate meals, minimizing animal protein, and decreasing salt intake. Information about dietary recommendations given today.    Call with renal ultrasound results RTC 1 year for KUB Consider metabolic work-up if recurrent stones  Nickolas Madrid, MD 08/16/2020  [place] [address] [place], [place] [address] [phone]

## 2020-08-24 DIAGNOSIS — N179 Acute kidney failure, unspecified: Secondary | ICD-10-CM | POA: Diagnosis not present

## 2020-08-24 DIAGNOSIS — E559 Vitamin D deficiency, unspecified: Secondary | ICD-10-CM | POA: Diagnosis not present

## 2020-08-24 DIAGNOSIS — Z6834 Body mass index (BMI) 34.0-34.9, adult: Secondary | ICD-10-CM | POA: Diagnosis not present

## 2020-08-24 DIAGNOSIS — N183 Chronic kidney disease, stage 3 unspecified: Secondary | ICD-10-CM | POA: Diagnosis not present

## 2020-08-30 ENCOUNTER — Encounter: Payer: Self-pay | Admitting: Podiatry

## 2020-08-30 ENCOUNTER — Other Ambulatory Visit: Payer: Self-pay

## 2020-08-30 ENCOUNTER — Ambulatory Visit (INDEPENDENT_AMBULATORY_CARE_PROVIDER_SITE_OTHER): Payer: Medicare Other | Admitting: Podiatry

## 2020-08-30 DIAGNOSIS — M7660 Achilles tendinitis, unspecified leg: Secondary | ICD-10-CM

## 2020-08-30 NOTE — Progress Notes (Signed)
   HPI: 69 y.o. male PMHx DM type II presenting today for follow-up evaluation of left Achilles pain.  Patient states that over the last 3 weeks he has done significantly better.  He currently has minimal to no pain to the Achilles tendon.  He wore the cam boot for a few days as directed.  He also completed the Medrol Dosepak as instructed.  No new complaints at this time   Past Medical History:  Diagnosis Date  . Arthritis    lower back  . Carpal tunnel syndrome of left wrist   . Chronic kidney disease   . Diabetes mellitus, type 2 ([place])   . GERD (gastroesophageal reflux disease)   . Heart murmur    followed by PCP  . Hyperlipidemia   . Sleep apnea    CPAP  . Vertigo    mild. several episodes/wk      Physical Exam: General: The patient is alert and oriented x3 in no acute distress.  Dermatology: Skin is warm, dry and supple bilateral lower extremities. Negative for open lesions or macerations.  Vascular: Palpable pedal pulses bilaterally. No edema or erythema noted. Capillary refill within normal limits.  Neurological: Epicritic and protective threshold grossly intact bilaterally.   Musculoskeletal Exam: Negative for any significant pain on palpation to the mid substance of the Achilles tendon approximately 3-5 cm proximal to the insertion of the calcaneal tubercle. Range of motion within normal limits. Muscle strength 5/5 in all muscle groups bilateral lower extremities.  Negative for any palpable dell.  Assessment: 1.  Achilles tendinitis left-resolved   Plan of Care:  1. Patient was evaluated.  2.  Patient has CKD.  No NSAIDs.  Continue OTC Tylenol as needed 4.  Discontinue cam boot.  Recommend good supportive sneakers 5.  Return to clinic as needed  *Going to the beach with friends 09/03/2020   Edrick Kins, DPM [facility]  Dr. Edrick Kins, [address]                                        [place], [place] [address]                 Office [phone]  Fax [phone]

## 2020-09-09 DIAGNOSIS — N183 Chronic kidney disease, stage 3 unspecified: Secondary | ICD-10-CM | POA: Diagnosis not present

## 2020-09-09 DIAGNOSIS — N179 Acute kidney failure, unspecified: Secondary | ICD-10-CM | POA: Diagnosis not present

## 2020-09-09 DIAGNOSIS — E559 Vitamin D deficiency, unspecified: Secondary | ICD-10-CM | POA: Diagnosis not present

## 2020-09-13 ENCOUNTER — Ambulatory Visit: Payer: Medicare Other

## 2020-09-20 ENCOUNTER — Ambulatory Visit
Admission: RE | Admit: 2020-09-20 | Discharge: 2020-09-20 | Disposition: A | Discharge status: Home / Self Care | Payer: Medicare Other | Source: Ambulatory Visit | Attending: Urology | Admitting: Urology

## 2020-09-20 ENCOUNTER — Other Ambulatory Visit: Payer: Self-pay

## 2020-09-20 DIAGNOSIS — N281 Cyst of kidney, acquired: Secondary | ICD-10-CM | POA: Diagnosis not present

## 2020-09-20 DIAGNOSIS — N200 Calculus of kidney: Secondary | ICD-10-CM | POA: Insufficient documentation

## 2020-09-30 DIAGNOSIS — Z01812 Encounter for preprocedural laboratory examination: Secondary | ICD-10-CM | POA: Diagnosis not present

## 2020-10-04 DIAGNOSIS — K5730 Diverticulosis of large intestine without perforation or abscess without bleeding: Secondary | ICD-10-CM | POA: Diagnosis not present

## 2020-10-04 DIAGNOSIS — K640 First degree hemorrhoids: Secondary | ICD-10-CM | POA: Diagnosis not present

## 2020-10-04 DIAGNOSIS — Z86010 Personal history of colonic polyps: Secondary | ICD-10-CM | POA: Diagnosis not present

## 2020-10-04 DIAGNOSIS — K5732 Diverticulitis of large intestine without perforation or abscess without bleeding: Secondary | ICD-10-CM | POA: Diagnosis not present

## 2020-10-04 DIAGNOSIS — Z8719 Personal history of other diseases of the digestive system: Secondary | ICD-10-CM | POA: Diagnosis not present

## 2020-10-05 ENCOUNTER — Telehealth: Payer: Self-pay

## 2020-10-05 ENCOUNTER — Ambulatory Visit: Payer: Medicare Other | Admitting: Nurse Practitioner

## 2020-10-05 NOTE — Telephone Encounter (Signed)
Called pt no answer. LM for pt per DPR informing her of the information below. Advised to call back for questions or concerns.

## 2020-10-05 NOTE — Telephone Encounter (Signed)
-----   Message from Billey Co, MD sent at 10/04/2020  1:13 PM EST ----- Renal ultrasound looks good, no residual hydronephrosis after his recent stone passage.  Follow-up as scheduled  Nickolas Madrid, MD 10/04/2020

## 2020-10-23 ENCOUNTER — Encounter: Payer: Self-pay | Admitting: Nurse Practitioner

## 2020-10-26 ENCOUNTER — Other Ambulatory Visit: Payer: Self-pay

## 2020-10-26 ENCOUNTER — Encounter: Payer: Self-pay | Admitting: Nurse Practitioner

## 2020-10-26 ENCOUNTER — Ambulatory Visit (INDEPENDENT_AMBULATORY_CARE_PROVIDER_SITE_OTHER): Payer: Medicare Other | Admitting: Nurse Practitioner

## 2020-10-26 VITALS — BP 125/76 | HR 80 | Temp 98.5°F | Wt 217.0 lb

## 2020-10-26 DIAGNOSIS — N1832 Chronic kidney disease, stage 3b: Secondary | ICD-10-CM

## 2020-10-26 DIAGNOSIS — E1122 Type 2 diabetes mellitus with diabetic chronic kidney disease: Secondary | ICD-10-CM

## 2020-10-26 DIAGNOSIS — Z23 Encounter for immunization: Secondary | ICD-10-CM

## 2020-10-26 DIAGNOSIS — N400 Enlarged prostate without lower urinary tract symptoms: Secondary | ICD-10-CM

## 2020-10-26 DIAGNOSIS — G4733 Obstructive sleep apnea (adult) (pediatric): Secondary | ICD-10-CM

## 2020-10-26 DIAGNOSIS — E1159 Type 2 diabetes mellitus with other circulatory complications: Secondary | ICD-10-CM | POA: Diagnosis not present

## 2020-10-26 DIAGNOSIS — E785 Hyperlipidemia, unspecified: Secondary | ICD-10-CM

## 2020-10-26 DIAGNOSIS — I152 Hypertension secondary to endocrine disorders: Secondary | ICD-10-CM

## 2020-10-26 DIAGNOSIS — Z6836 Body mass index (BMI) 36.0-36.9, adult: Secondary | ICD-10-CM

## 2020-10-26 DIAGNOSIS — E0821 Diabetes mellitus due to underlying condition with diabetic nephropathy: Secondary | ICD-10-CM | POA: Diagnosis not present

## 2020-10-26 DIAGNOSIS — E1169 Type 2 diabetes mellitus with other specified complication: Secondary | ICD-10-CM

## 2020-10-26 DIAGNOSIS — Z9989 Dependence on other enabling machines and devices: Secondary | ICD-10-CM | POA: Diagnosis not present

## 2020-10-26 LAB — BAYER DCA HB A1C WAIVED: HB A1C (BAYER DCA - WAIVED): 6.3 % (ref <7.0)

## 2020-10-26 MED ORDER — TAMSULOSIN HCL 0.4 MG PO CAPS: 0.4000 mg | ORAL_CAPSULE | Freq: Every day | ORAL | 4 refills | Status: DC

## 2020-10-26 NOTE — Assessment & Plan Note (Signed)
Refer to morbid obesity plan, recommend focus on diet changes and regular exercise.

## 2020-10-26 NOTE — Assessment & Plan Note (Signed)
Chronic, stable with BP at goal in office and home readings.  Continue current medication regimen and adjust as needed.  Recommend checking BP at least three times a week at home.  Focus on DASH diet.  Return in 3 months.

## 2020-10-26 NOTE — Assessment & Plan Note (Signed)
Chronic, ongoing.  Continue current medication regimen and adjust as needed.  Lipid panel next visit.    

## 2020-10-26 NOTE — Progress Notes (Signed)
BP 125/76   Pulse 80   Temp 98.5 F (36.9 C)   Wt 217 lb (98.4 kg)   SpO2 97%   BMI 35.02 kg/m    Subjective:    Patient ID: Thomas Huang, male    DOB: [birth year], 69 y.o.   MRN: [id number]  HPI: WENDALL ISABELL is a 69 y.o. male  Chief Complaint  Patient presents with  . Diabetes  . Hypertension  . Chronic Kidney Disease   DIABETES A1C in August 6.8%.  Continues on Glimepiride -- has been taking twice a day for the past 1 1/2 weeks which has helped sugar levels improve.  He endorses diet has been a little off with the holidays.   Hypoglycemic episodes:no Polydipsia/polyuria: no Visual disturbance: no Chest pain: no Paresthesias: no Glucose Monitoring: yes             Accucheck frequency: daily -- has been checking 3 times a day             Fasting glucose: 130 range             Post prandial:              Evening:              Before meals: 130 range Taking Insulin?: no             Long acting insulin:             Short acting insulin: Blood Pressure Monitoring: daily Retinal Examination: Not Up to Date Foot Exam: Up to Date Pneumovax: Up to Date Influenza: Up to Date, at [place] received it Aspirin: no   HYPERTENSION / HYPERLIPIDEMIA Continues on Lisinopril and Atorvastatin.  Echo in 2017 with EF 60-65%, mild to moderately calcified aortic leaflets.  Saw cardiology in 2017 and has not returned, but reports a known heart murmur.  Uses CPAP 100% at home for OSA.  Satisfied with current treatment? yes Duration of hypertension: chronic BP monitoring frequency: rarely BP range: < 130/80 on average BP medication side effects: no Duration of hyperlipidemia: chronic Cholesterol medication side effects: no Cholesterol supplements: none Medication compliance: good compliance Aspirin: no Recent stressors: no Recurrent headaches: no Visual changes: no Palpitations: no Dyspnea: no Chest pain: no Lower extremity edema: no Dizzy/lightheaded: no   CHRONIC  KIDNEY DISEASE GFR in October with [facility] 28, CRT 2.28, K+ 4.8.  Sees nephrology at [facility], last 08/24/20. Recently had a bout with kidney stones and saw Dr. Diamantina Providence with urology -- unsure whether next step is surgery or to just leave alone, is waiting to hear from urology on this.    CKD status: stable Medications renally dose: yes Previous renal evaluation: yes Pneumovax:  Up to Date Influenza Vaccine:  Up to Date   Relevant past medical, surgical, family and social history reviewed and updated as indicated. Interim medical history since our last visit reviewed. Allergies and medications reviewed and updated.  Review of Systems  Constitutional: Negative for activity change, diaphoresis, fatigue and fever.  Respiratory: Negative for cough, chest tightness, shortness of breath and wheezing.   Cardiovascular: Negative for chest pain, palpitations and leg swelling.  Gastrointestinal: Negative.   Endocrine: Negative for polydipsia, polyphagia and polyuria.  Neurological: Negative.   Psychiatric/Behavioral: Negative.     Per HPI unless specifically indicated above     Objective:    BP 125/76   Pulse 80   Temp 98.5 F (36.9 C)   Wt 217  lb (98.4 kg)   SpO2 97%   BMI 35.02 kg/m   Wt Readings from Last 3 Encounters:  10/26/20 217 lb (98.4 kg)  08/16/20 212 lb (96.2 kg)  07/05/20 215 lb (97.5 kg)    Physical Exam Vitals and nursing note reviewed.  Constitutional:      General: He is awake. He is not in acute distress.    Appearance: He is well-developed and well-groomed. He is morbidly obese. He is not ill-appearing.  HENT:     Head: Normocephalic and atraumatic.     Right Ear: Hearing normal. No drainage.     Left Ear: Hearing normal. No drainage.  Eyes:     General: Lids are normal.        Right eye: No discharge.        Left eye: No discharge.     Conjunctiva/sclera: Conjunctivae normal.     Pupils: Pupils are equal, round, and reactive to light.  Neck:     Vascular: No  carotid bruit.  Cardiovascular:     Rate and Rhythm: Normal rate and regular rhythm.     Heart sounds: S1 normal and S2 normal. Murmur heard.  Systolic murmur is present with a grade of 2/6.  No gallop.   Pulmonary:     Effort: Pulmonary effort is normal. No accessory muscle usage or respiratory distress.     Breath sounds: Normal breath sounds.  Abdominal:     General: Bowel sounds are normal.     Palpations: Abdomen is soft.  Musculoskeletal:        General: Normal range of motion.     Cervical back: Normal range of motion and neck supple.     Right lower leg: No edema.     Left lower leg: No edema.  Skin:    General: Skin is warm and dry.     Capillary Refill: Capillary refill takes less than 2 seconds.  Neurological:     Mental Status: He is alert and oriented to person, place, and time.  Psychiatric:        Attention and Perception: Attention normal.        Mood and Affect: Mood normal.        Speech: Speech normal.        Behavior: Behavior normal. Behavior is cooperative.        Thought Content: Thought content normal.    Results for orders placed or performed during the hospital encounter of 08/16/20  Urinalysis, Complete w Microscopic  Result Value Ref Range   Color, Urine YELLOW YELLOW   APPearance CLEAR CLEAR   Specific Gravity, Urine 1.015 1.005 - 1.030   pH 5.5 5.0 - 8.0   Glucose, UA 100 (A) NEGATIVE mg/dL   Hgb urine dipstick NEGATIVE NEGATIVE   Bilirubin Urine NEGATIVE NEGATIVE   Ketones, ur NEGATIVE NEGATIVE mg/dL   Protein, ur NEGATIVE NEGATIVE mg/dL   Nitrite NEGATIVE NEGATIVE   Leukocytes,Ua NEGATIVE NEGATIVE   Squamous Epithelial / LPF 0-5 0 - 5   WBC, UA 0-5 0 - 5 WBC/hpf   RBC / HPF NONE SEEN 0 - 5 RBC/hpf   Bacteria, UA FEW (A) NONE SEEN   Hyaline Casts, UA PRESENT       Assessment & Plan:   Problem List Items Addressed This Visit      Cardiovascular and Mediastinum   Hypertension associated with diabetes ([place])    Chronic, stable with  BP at goal in office and home readings.  Continue  current medication regimen and adjust as needed.  Recommend checking BP at least three times a week at home.  Focus on DASH diet.  Return in 3 months.        Respiratory   OSA on CPAP    100% use of CPAP at home, continue adherent regimen.        Endocrine   Hyperlipidemia associated with type 2 diabetes mellitus (HCC)    Chronic, ongoing.  Continue current medication regimen and adjust as needed.  Lipid panel next visit.      Diabetes mellitus with renal manifestation (HCC) - Primary    Chronic, ongoing with A1C 6.3% today, trend down.  Continue current medication regimen and adjust as needed, discussed with him to closely monitor BS with this and notify provider if consistent <70.  He wishes to focus on diet and exercise.  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  Monitor use of Glimepiride due to age and if BS consistently <70 consider alternate options.  Check BS at home as least three mornings a week.  Return in 3 months.       Relevant Orders   Bayer DCA Hb A1c Waived     Genitourinary   CKD (chronic kidney disease), stage III (HCC)    Chronic, ongoing.  Monitored by nephrology at [facility], continue collaboration and Lisinopril at this time.  BMP up to date.      BPH (benign prostatic hyperplasia)   Relevant Medications   tamsulosin (FLOMAX) 0.4 MG CAPS capsule     Other   Morbid obesity (HCC)    BMI 35.02.  Recommended eating smaller high protein, low fat meals more frequently and exercising 30 mins a day 5 times a week with a goal of 10-15lb weight loss in the next 3 months. Patient voiced their understanding and motivation to adhere to these recommendations.       BMI 36.0-36.9,adult    Refer to morbid obesity plan, recommend focus on diet changes and regular exercise.       Other Visit Diagnoses    Need for influenza vaccination       Flu vaccine today.   Relevant Orders   Flu Vaccine QUAD High  Dose(Fluad) (Completed)       Follow up plan: Return in about 8 weeks (around 12/21/2020) for Annual physical and meet new PCP.

## 2020-10-26 NOTE — Assessment & Plan Note (Signed)
100% use of CPAP at home, continue adherent regimen.

## 2020-10-26 NOTE — Assessment & Plan Note (Signed)
BMI 35.02.  Recommended eating smaller high protein, low fat meals more frequently and exercising 30 mins a day 5 times a week with a goal of 10-15lb weight loss in the next 3 months. Patient voiced their understanding and motivation to adhere to these recommendations.

## 2020-10-26 NOTE — Assessment & Plan Note (Signed)
Chronic, ongoing with A1C 6.3% today, trend down.  Continue current medication regimen and adjust as needed, discussed with him to closely monitor BS with this and notify provider if consistent <70.  He wishes to focus on diet and exercise.  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  Monitor use of Glimepiride due to age and if BS consistently <70 consider alternate options.  Check BS at home as least three mornings a week.  Return in 3 months.

## 2020-10-26 NOTE — Patient Instructions (Signed)

## 2020-10-26 NOTE — Assessment & Plan Note (Signed)
Chronic, ongoing.  Monitored by nephrology at [facility], continue collaboration and Lisinopril at this time.  BMP up to date.

## 2020-11-02 DIAGNOSIS — Z6835 Body mass index (BMI) 35.0-35.9, adult: Secondary | ICD-10-CM | POA: Diagnosis not present

## 2020-11-02 DIAGNOSIS — E559 Vitamin D deficiency, unspecified: Secondary | ICD-10-CM | POA: Diagnosis not present

## 2020-11-02 DIAGNOSIS — N183 Chronic kidney disease, stage 3 unspecified: Secondary | ICD-10-CM | POA: Diagnosis not present

## 2020-11-02 DIAGNOSIS — N179 Acute kidney failure, unspecified: Secondary | ICD-10-CM | POA: Diagnosis not present

## 2020-11-02 DIAGNOSIS — I10 Essential (primary) hypertension: Secondary | ICD-10-CM | POA: Diagnosis not present

## 2020-12-07 ENCOUNTER — Other Ambulatory Visit: Payer: Self-pay | Admitting: Nurse Practitioner

## 2020-12-07 DIAGNOSIS — I152 Hypertension secondary to endocrine disorders: Secondary | ICD-10-CM

## 2020-12-07 DIAGNOSIS — N1831 Chronic kidney disease, stage 3a: Secondary | ICD-10-CM

## 2020-12-07 DIAGNOSIS — E1159 Type 2 diabetes mellitus with other circulatory complications: Secondary | ICD-10-CM

## 2020-12-07 NOTE — Telephone Encounter (Signed)
Patient last seen 10/26/20

## 2020-12-18 ENCOUNTER — Other Ambulatory Visit: Payer: Self-pay | Admitting: Nurse Practitioner

## 2020-12-18 DIAGNOSIS — N400 Enlarged prostate without lower urinary tract symptoms: Secondary | ICD-10-CM

## 2020-12-23 ENCOUNTER — Telehealth: Payer: Self-pay | Admitting: *Deleted

## 2020-12-23 NOTE — Chronic Care Management (AMB) (Signed)
  Chronic Care Management   Outreach Note  12/23/2020 Name: [name] MRN: [id number] DOB: [birth year]  Thomas Huang is a 70 y.o. year old male who is a primary care patient of Crissman, Jeannette How, MD. I reached out to [name] by phone today in response to a referral sent by Mr. Bertram Gala Moor's health plan.     An unsuccessful telephone outreach was attempted today. The patient was referred to the case management team for assistance with care management and care coordination.   Follow Up Plan: A HIPAA compliant phone message was left for the patient providing contact information and requesting a return call. The care management team will reach out to the patient again over the next 7 days. If patient returns call to provider office, please advise to call [place] at [phone].  [place] Management

## 2020-12-29 NOTE — Chronic Care Management (AMB) (Signed)
  Chronic Care Management   Outreach Note  12/29/2020 Name: MC BLOODWORTH MRN: [id number] DOB: 10-Dec-1950  ALANZO LAMB is a 70 y.o. year old male who is a primary care patient of Crissman, Jeannette How, MD. I reached out to [name] by phone today in response to a referral sent by Mr. Bertram Gala [place]'s health plan.     A second unsuccessful telephone outreach was attempted today. The patient was referred to the case management team for assistance with care management and care coordination.   Follow Up Plan: The care management team will reach out to the patient again over the next 7 days. If patient returns call to provider office, please advise to call [place] at [phone].  [place] Management

## 2021-01-05 NOTE — Chronic Care Management (AMB) (Signed)
  Chronic Care Management   Outreach Note  01/05/2021 Name: Thomas Huang MRN: [id number] DOB: 24-Nov-1951  Thomas Huang is a 70 y.o. year old male who is a primary care patient of Crissman, Jeannette How, MD. I reached out to [name] by phone today in response to a referral sent by [name]'s health plan.     Third unsuccessful telephone outreach was attempted today. The patient was referred to the case management team for assistance with care management and care coordination. The patient's primary care provider has been notified of our unsuccessful attempts to make or maintain contact with the patient. The care management team is pleased to engage with this patient at any time in the future should he/she be interested in assistance from the care management team.   [place] Management  Direct Dial: [phone]

## 2021-01-09 ENCOUNTER — Other Ambulatory Visit: Payer: Self-pay | Admitting: Nurse Practitioner

## 2021-01-09 DIAGNOSIS — E785 Hyperlipidemia, unspecified: Secondary | ICD-10-CM

## 2021-01-09 DIAGNOSIS — E1169 Type 2 diabetes mellitus with other specified complication: Secondary | ICD-10-CM

## 2021-01-12 ENCOUNTER — Telehealth: Payer: Self-pay

## 2021-01-12 NOTE — Telephone Encounter (Signed)
Called pt to schdled for Annual physical and meet new PCP

## 2021-01-23 NOTE — Telephone Encounter (Signed)
Please call PT back to schedule appt with new PCP. Please call back

## 2021-01-23 NOTE — Telephone Encounter (Signed)
Lvm to schedule apt/  

## 2021-01-29 ENCOUNTER — Other Ambulatory Visit: Payer: Self-pay | Admitting: Nurse Practitioner

## 2021-01-29 NOTE — Telephone Encounter (Signed)
Requested medication (s) are due for refill today: -  Requested medication (s) are on the active medication list: historical med  Last refill:  12/07/19   Future visit scheduled: yes  Notes to clinic:  historical med and provider.   Requested Prescriptions  Pending Prescriptions Disp Refills   omeprazole (PRILOSEC) 20 MG capsule [Pharmacy Med Name: OMEPRAZOLE DR 20 MG CAPSULE] 90 capsule 3    Sig: TAKE 1 CAPSULE BY MOUTH EVERY DAY      Gastroenterology: Proton Pump Inhibitors Passed - 01/29/2021  9:08 AM      Passed - Valid encounter within last 12 months    Recent Outpatient Visits           3 months ago Diabetes mellitus due to underlying condition with diabetic nephropathy, without long-term current use of insulin ([place])   [place], [place] T, NP   6 months ago Diabetes mellitus due to underlying condition with diabetic nephropathy, without long-term current use of insulin ([place])   [place], [place] T, NP   7 months ago LLQ pain   [place], [place], [place]   10 months ago Diabetes mellitus due to underlying condition with diabetic nephropathy, without long-term current use of insulin ([place])   [place], Henrine Screws T, NP   1 year ago Annual physical exam   [place], Barbaraann Faster, NP       Future Appointments             In 5 days Vigg, Avanti, MD [facility], [place]   In 6 months Sninsky, Herbert Seta, [place]

## 2021-01-30 ENCOUNTER — Other Ambulatory Visit: Payer: Self-pay | Admitting: Nurse Practitioner

## 2021-02-03 ENCOUNTER — Encounter: Payer: Self-pay | Admitting: Internal Medicine

## 2021-02-03 ENCOUNTER — Other Ambulatory Visit: Payer: Self-pay

## 2021-02-03 ENCOUNTER — Ambulatory Visit (INDEPENDENT_AMBULATORY_CARE_PROVIDER_SITE_OTHER): Payer: Medicare Other | Admitting: Internal Medicine

## 2021-02-03 VITALS — BP 119/75 | HR 89 | Temp 99.1°F | Ht 65.83 in | Wt 215.6 lb

## 2021-02-03 DIAGNOSIS — N1832 Chronic kidney disease, stage 3b: Secondary | ICD-10-CM

## 2021-02-03 DIAGNOSIS — M549 Dorsalgia, unspecified: Secondary | ICD-10-CM | POA: Diagnosis not present

## 2021-02-03 DIAGNOSIS — E0821 Diabetes mellitus due to underlying condition with diabetic nephropathy: Secondary | ICD-10-CM

## 2021-02-03 LAB — URINALYSIS, ROUTINE W REFLEX MICROSCOPIC
Bilirubin, UA: NEGATIVE
Glucose, UA: NEGATIVE
Ketones, UA: NEGATIVE
Leukocytes,UA: NEGATIVE
Nitrite, UA: NEGATIVE
Protein,UA: NEGATIVE
Specific Gravity, UA: 1.015 (ref 1.005–1.030)
Urobilinogen, Ur: 0.2 mg/dL (ref 0.2–1.0)
pH, UA: 5.5 (ref 5.0–7.5)

## 2021-02-03 LAB — BAYER DCA HB A1C WAIVED: HB A1C (BAYER DCA - WAIVED): 7.1 % — ABNORMAL HIGH (ref <7.0)

## 2021-02-03 NOTE — Progress Notes (Signed)
BP 119/75   Pulse 89   Temp 99.1 F (37.3 C) (Oral)   Ht 5' 5.83" (1.672 m)   Wt 215 lb 9.6 oz (97.8 kg)   SpO2 96%   BMI 34.98 kg/m    Subjective:    Patient ID: Thomas Huang, male    DOB: 1951-11-02, 70 y.o.   MRN: [id number]  HPI: Thomas Huang is a 70 y.o. male  Patient is a 70 year old white male with a past medical history of diabetes hypertension IBS CKD stage III, nephrolithiasis and BPH pancreatic  cyst followed up with oncology with annual MRIs last done as below.  has a history of  L3-5 laminectomy in 2000 radiculopathy worsening as below  Diabetes He presents for his follow-up diabetic visit. He has type 2 diabetes mellitus. Pertinent negatives for hypoglycemia include no confusion, dizziness, headaches, nervousness/anxiousness or speech difficulty. Pertinent negatives for diabetes include no blurred vision, no chest pain, no fatigue, no polydipsia, no polyphagia, no polyuria and no weakness.  Hypertension This is a chronic problem. The current episode started more than 1 month ago. The problem is controlled. Pertinent negatives include no anxiety, blurred vision, chest pain, headaches, malaise/fatigue, neck pain, orthopnea, palpitations or shortness of breath.  Hyperlipidemia This is a chronic problem. The problem is controlled. Pertinent negatives include no chest pain or shortness of breath.  Benign Prostatic Hypertrophy Irritative symptoms do not include frequency or urgency. Pertinent negatives include no chills, dysuria, hematuria or nausea.  Back Pain This is a chronic (s/p laminectomy for such - sees ortho for such ) problem. The current episode started more than 1 year ago (has worsening hip pain and cannot walk more than 250 feet has had sciatica ). Pertinent negatives include no abdominal pain, chest pain, dysuria, fever, headaches, numbness or weakness.    Chief Complaint  Patient presents with  . Diabetes  . Hypertension  . Hyperlipidemia  . Benign  Prostatic Hypertrophy  . Chronic Kidney Disease  . Irritable Bowel Syndrome    Relevant past medical, surgical, family and social history reviewed and updated as indicated. Interim medical history since our last visit reviewed. Allergies and medications reviewed and updated.  Review of Systems  Constitutional: Negative for activity change, appetite change, chills, fatigue, fever and malaise/fatigue.  HENT: Negative for congestion.   Eyes: Negative for blurred vision and visual disturbance.  Respiratory: Negative for apnea, cough, chest tightness, shortness of breath and wheezing.   Cardiovascular: Negative for chest pain, palpitations, orthopnea and leg swelling.  Gastrointestinal: Negative for abdominal distention, abdominal pain, diarrhea and nausea.  Endocrine: Negative for cold intolerance, heat intolerance, polydipsia, polyphagia and polyuria.  Genitourinary: Negative for difficulty urinating, dysuria, frequency, hematuria and urgency.  Musculoskeletal: Positive for back pain and gait problem. Negative for neck pain.  Skin: Negative for color change and rash.  Neurological: Negative for dizziness, speech difficulty, weakness, light-headedness, numbness and headaches.  Psychiatric/Behavioral: Negative for behavioral problems and confusion. The patient is not nervous/anxious.     Per HPI unless specifically indicated above     Objective:    BP 119/75   Pulse 89   Temp 99.1 F (37.3 C) (Oral)   Ht 5' 5.83" (1.672 m)   Wt 215 lb 9.6 oz (97.8 kg)   SpO2 96%   BMI 34.98 kg/m   Wt Readings from Last 3 Encounters:  02/03/21 215 lb 9.6 oz (97.8 kg)  10/26/20 217 lb (98.4 kg)  08/16/20 212 lb (96.2 kg)  Physical Exam Vitals and nursing note reviewed.  Constitutional:      General: He is not in acute distress.    Appearance: Normal appearance. He is not ill-appearing or diaphoretic.  HENT:     Head: Normocephalic and atraumatic.     Right Ear: Tympanic membrane and  external ear normal. There is no impacted cerumen.     Left Ear: External ear normal.     Nose: No congestion or rhinorrhea.     Mouth/Throat:     Pharynx: No oropharyngeal exudate or posterior oropharyngeal erythema.  Eyes:     Conjunctiva/sclera: Conjunctivae normal.     Pupils: Pupils are equal, round, and reactive to light.  Cardiovascular:     Rate and Rhythm: Normal rate and regular rhythm.     Heart sounds: No murmur heard. No friction rub. No gallop.   Pulmonary:     Effort: No respiratory distress.     Breath sounds: No stridor. No wheezing or rhonchi.  Chest:     Chest wall: No tenderness.  Abdominal:     General: Abdomen is flat. Bowel sounds are normal. There is no distension.     Palpations: Abdomen is soft. There is no mass.     Tenderness: There is no abdominal tenderness. There is no guarding.  Musculoskeletal:        General: Tenderness present. No swelling, deformity or signs of injury.     Cervical back: Normal range of motion and neck supple. No rigidity or tenderness.     Right lower leg: No edema.     Left lower leg: No edema.     Comments: l Spine and paraspinal muscles tender to touch  Skin:    General: Skin is warm and dry.     Coloration: Skin is not jaundiced.     Findings: No erythema.  Neurological:     Mental Status: He is alert and oriented to person, place, and time. Mental status is at baseline.  Psychiatric:        Mood and Affect: Mood normal.        Behavior: Behavior normal.        Thought Content: Thought content normal.        Judgment: Judgment normal.     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 There are multiple additional  cystic lesions throughout the pancreatic tail and in the pancreatic head/neck. 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   Assessment & Plan:  1. CKD sees [facility] nephrology  Stage III Fu and mx per CKD  2. DM stbale last a1c in low 6's Is on amyrl consider stopping check HbA1c,  urine  microalbumin  diabetic diet plan given to pt  adviced regarding hypoglycemia and instructions given to pt today on how to prevent and treat the same if it were to occur. pt acknowledges the plan and voices understanding of the same.  exercise plan given and encouraged.   advice diabetic yearly podiatry, ophthalmology , nutritionist , dental check q 6 months  3. BPH/  nephrolithiasis is on finasteride and flomax stable is seeing urology for such  Stable denies any hematuria check UA  4. IBS : Is on miralax for such , constipation  Increase water intake 64 oz  Increase fiber in diet.  Adding Amitiza   5. HLD   stable lipitor recheck FLP, check LFT's work on diet, SE of meds explained to pt. low fat and high fiber diet explained to pt.  6. Pancreatic cyst  sees [facility] oncology for such dr blazer gets yearly MRis for such  Chronic, stable asymptomatic  7 back pain hip joint / Lumbar spine pain  With sciatica  Fu and mx per ortho  Couldn't have surg  Wants to see dennis turner neurosurg sx in 2017  And symptoms are getting worse he does have sciatica was to be referred back to Dr. Radford Pax who did his surgery in 2017.  Patient does need an MRI prior to his visit per the surgeon's protocol we will order MRI of L-spine patient to follow-up with me with results in 2 weeks.  He is supposed to have a laminectomy is chronic symptoms as well as acute worsening of back pain with sciatica   Problem List Items Addressed This Visit   None      Follow up plan: No follow-ups on file.  Health Maintenance  : PSA : 12/07/2019 Cscope : 10/04/2020 Pneumonia vaccine :Prevnar 2019 pneumovax 05/15/2020

## 2021-02-04 LAB — CBC WITH DIFFERENTIAL/PLATELET
Basophils Absolute: 0.1 10*3/uL (ref 0.0–0.2)
Basos: 1 %
EOS (ABSOLUTE): 0.3 10*3/uL (ref 0.0–0.4)
Eos: 5 %
Hematocrit: 39.9 % (ref 37.5–51.0)
Hemoglobin: 13.1 g/dL (ref 13.0–17.7)
Immature Grans (Abs): 0 10*3/uL (ref 0.0–0.1)
Immature Granulocytes: 0 %
Lymphocytes Absolute: 1.2 10*3/uL (ref 0.7–3.1)
Lymphs: 21 %
MCH: 29.9 pg (ref 26.6–33.0)
MCHC: 32.8 g/dL (ref 31.5–35.7)
MCV: 91 fL (ref 79–97)
Monocytes Absolute: 0.6 10*3/uL (ref 0.1–0.9)
Monocytes: 9 %
Neutrophils Absolute: 3.8 10*3/uL (ref 1.4–7.0)
Neutrophils: 64 %
Platelets: 167 10*3/uL (ref 150–450)
RBC: 4.38 x10E6/uL (ref 4.14–5.80)
RDW: 14.4 % (ref 11.6–15.4)
WBC: 5.9 10*3/uL (ref 3.4–10.8)

## 2021-02-04 LAB — BASIC METABOLIC PANEL
BUN/Creatinine Ratio: 14 (ref 10–24)
BUN: 30 mg/dL — ABNORMAL HIGH (ref 8–27)
CO2: 19 mmol/L — ABNORMAL LOW (ref 20–29)
Calcium: 9.2 mg/dL (ref 8.6–10.2)
Chloride: 107 mmol/L — ABNORMAL HIGH (ref 96–106)
Creatinine, Ser: 2.1 mg/dL — ABNORMAL HIGH (ref 0.76–1.27)
Glucose: 148 mg/dL — ABNORMAL HIGH (ref 65–99)
Potassium: 4.8 mmol/L (ref 3.5–5.2)
Sodium: 140 mmol/L (ref 134–144)
eGFR: 34 mL/min/{1.73_m2} — ABNORMAL LOW (ref >59)

## 2021-02-04 LAB — LIPID PANEL
Chol/HDL Ratio: 4.3 ratio (ref 0.0–5.0)
Cholesterol, Total: 137 mg/dL (ref 100–199)
HDL: 32 mg/dL — ABNORMAL LOW (ref >39)
LDL Chol Calc (NIH): 82 mg/dL (ref 0–99)
Triglycerides: 128 mg/dL (ref 0–149)
VLDL Cholesterol Cal: 23 mg/dL (ref 5–40)

## 2021-02-17 ENCOUNTER — Other Ambulatory Visit: Payer: Self-pay

## 2021-02-17 ENCOUNTER — Ambulatory Visit
Admission: RE | Admit: 2021-02-17 | Discharge: 2021-02-17 | Disposition: A | Discharge status: Home / Self Care | Payer: Medicare Other | Source: Ambulatory Visit | Attending: Internal Medicine | Admitting: Internal Medicine

## 2021-02-17 ENCOUNTER — Ambulatory Visit: Payer: Medicare Other | Admitting: Internal Medicine

## 2021-02-17 ENCOUNTER — Encounter: Payer: Self-pay | Admitting: Nurse Practitioner

## 2021-02-17 ENCOUNTER — Ambulatory Visit (INDEPENDENT_AMBULATORY_CARE_PROVIDER_SITE_OTHER): Payer: Medicare Other | Admitting: Nurse Practitioner

## 2021-02-17 VITALS — BP 136/69 | HR 88 | Temp 98.7°F | Wt 216.8 lb

## 2021-02-17 DIAGNOSIS — I152 Hypertension secondary to endocrine disorders: Secondary | ICD-10-CM

## 2021-02-17 DIAGNOSIS — M549 Dorsalgia, unspecified: Secondary | ICD-10-CM | POA: Diagnosis not present

## 2021-02-17 DIAGNOSIS — M545 Low back pain, unspecified: Secondary | ICD-10-CM | POA: Diagnosis not present

## 2021-02-17 DIAGNOSIS — E785 Hyperlipidemia, unspecified: Secondary | ICD-10-CM

## 2021-02-17 DIAGNOSIS — E1169 Type 2 diabetes mellitus with other specified complication: Secondary | ICD-10-CM

## 2021-02-17 DIAGNOSIS — E1159 Type 2 diabetes mellitus with other circulatory complications: Secondary | ICD-10-CM

## 2021-02-17 DIAGNOSIS — N1832 Chronic kidney disease, stage 3b: Secondary | ICD-10-CM

## 2021-02-17 NOTE — Assessment & Plan Note (Addendum)
Blood pressure controlled today. Lab results discussed today. A1C is 7.1%. Continue current regimen. Follow-up in 3 months.

## 2021-02-17 NOTE — Progress Notes (Signed)
BP 136/69 (BP Location: Left Arm, Cuff Size: Normal)   Pulse 88   Temp 98.7 F (37.1 C) (Oral)   Wt 216 lb 12.8 oz (98.3 kg)   SpO2 98%   BMI 35.18 kg/m    Subjective:    Patient ID: Thomas Huang, male    DOB: Sep 04, 1951, 70 y.o.   MRN: [id number]  HPI: Thomas Huang is a 70 y.o. male who presents to review lab results. Currently everything is stable, no new symptoms from last visit 2 weeks ago. He states he has a MRI scheduled of his back today and he has an appointment with neurosurgery in the beginning of April.  Chief Complaint  Patient presents with  . Lab Review    Pt states he is here to review most recent lab results    Past Medical History:  Diagnosis Date  . Arthritis    lower back  . Carpal tunnel syndrome of left wrist   . Chronic kidney disease   . Diabetes mellitus, type 2 ([place])   . GERD (gastroesophageal reflux disease)   . Heart murmur    followed by PCP  . Hyperlipidemia   . Sleep apnea    CPAP  . Vertigo    mild. several episodes/wk     Past Surgical History:  Procedure Laterality Date  . CARPAL TUNNEL RELEASE    . CLOSED REDUCTION NASAL FRACTURE N/A 11/05/2017   Procedure: CLOSED REDUCTION NASAL FRACTURE WITH NASAL TURBINATE COBLATION;  Surgeon: Clyde Canterbury, MD;  Location: [facility];  Service: ENT;  Laterality: N/A;  Diabetic - oral meds  . LAMINECTOMY     lumbar, [facility]  . recurrent kidney stones       Family History  Problem Relation Age of Onset  . Diabetes Mother   . Heart disease Mother   . Cancer Mother        breast and lung  . Stroke Mother   . Heart attack Mother   . Cancer Father        pancreatic  . Hypertension Brother   . Hyperlipidemia Brother   . Diabetes Brother   . Diabetes Maternal Grandmother   . Diabetes Paternal Grandmother      Current Outpatient Medications on File Prior to Visit  Medication Sig Dispense Refill  . atorvastatin (LIPITOR) 10 MG tablet TAKE 1 TABLET BY MOUTH EVERY DAY 90 tablet 0   . finasteride (PROSCAR) 5 MG tablet Take 1 tablet (5 mg total) by mouth daily. 90 tablet 4  . fluticasone (FLONASE) 50 MCG/ACT nasal spray Place 1 spray into both nostrils as needed for allergies or rhinitis.    Marland Kitchen glimepiride (AMARYL) 2 MG tablet Take 1 tablet (2 mg total) by mouth 2 (two) times daily before a meal. 180 tablet 4  . glucose blood test strip 1 each by Other route as needed for other. Use as instructed    . lisinopril (ZESTRIL) 10 MG tablet TAKE 1 TABLET BY MOUTH EVERY DAY 90 tablet 4  . omeprazole (PRILOSEC) 20 MG capsule TAKE 1 CAPSULE BY MOUTH EVERY DAY 90 capsule 3  . polyethylene glycol (MIRALAX / GLYCOLAX) packet Take 17 g by mouth daily as needed.    . sildenafil (REVATIO) 20 MG tablet Take 1 tablet (20 mg total) by mouth once as needed. Take 1-5 prn ED 50 tablet 12  . tamsulosin (FLOMAX) 0.4 MG CAPS capsule Take 1 capsule (0.4 mg total) by mouth daily. 90 capsule 4  No current facility-administered medications on file prior to visit.      Review of Systems  Per HPI unless specifically indicated above     Objective:    BP 136/69 (BP Location: Left Arm, Cuff Size: Normal)   Pulse 88   Temp 98.7 F (37.1 C) (Oral)   Wt 216 lb 12.8 oz (98.3 kg)   SpO2 98%   BMI 35.18 kg/m   Wt Readings from Last 3 Encounters:  02/17/21 216 lb 12.8 oz (98.3 kg)  02/03/21 215 lb 9.6 oz (97.8 kg)  10/26/20 217 lb (98.4 kg)    Physical Exam Vitals and nursing note reviewed.  Constitutional:      General: He is not in acute distress.    Appearance: Normal appearance.  Eyes:     Conjunctiva/sclera: Conjunctivae normal.  Neurological:     General: No focal deficit present.     Mental Status: He is alert and oriented to person, place, and time.  Psychiatric:        Mood and Affect: Mood normal.        Behavior: Behavior normal.        Thought Content: Thought content normal.        Judgment: Judgment normal.     Results for orders placed or performed in visit on  02/03/21  Bayer DCA Hb A1c Waived  Result Value Ref Range   HB A1C (BAYER DCA - WAIVED) 7.1 (H) <7.0 %  Lipid panel  Result Value Ref Range   Cholesterol, Total 137 100 - 199 mg/dL   Triglycerides 128 0 - 149 mg/dL   HDL 32 (L) >39 mg/dL   VLDL Cholesterol Cal 23 5 - 40 mg/dL   LDL Chol Calc (NIH) 82 0 - 99 mg/dL   Chol/HDL Ratio 4.3 0.0 - 5.0 ratio  Basic metabolic panel  Result Value Ref Range   Glucose 148 (H) 65 - 99 mg/dL   BUN 30 (H) 8 - 27 mg/dL   Creatinine, Ser 2.10 (H) 0.76 - 1.27 mg/dL   eGFR 34 (L) >59 mL/min/1.73   BUN/Creatinine Ratio 14 10 - 24   Sodium 140 134 - 144 mmol/L   Potassium 4.8 3.5 - 5.2 mmol/L   Chloride 107 (H) 96 - 106 mmol/L   CO2 19 (L) 20 - 29 mmol/L   Calcium 9.2 8.6 - 10.2 mg/dL  Urinalysis, Routine w reflex microscopic  Result Value Ref Range   Specific Gravity, UA 1.015 1.005 - 1.030   pH, UA 5.5 5.0 - 7.5   Color, UA Yellow Yellow   Appearance Ur Clear Clear   Leukocytes,UA Negative Negative   Protein,UA Negative Negative/Trace   Glucose, UA Negative Negative   Ketones, UA Negative Negative   RBC, UA Trace (A) Negative   Bilirubin, UA Negative Negative   Urobilinogen, Ur 0.2 0.2 - 1.0 mg/dL   Nitrite, UA Negative Negative  CBC with Differential/Platelet  Result Value Ref Range   WBC 5.9 3.4 - 10.8 x10E3/uL   RBC 4.38 4.14 - 5.80 x10E6/uL   Hemoglobin 13.1 13.0 - 17.7 g/dL   Hematocrit 39.9 37.5 - 51.0 %   MCV 91 79 - 97 fL   MCH 29.9 26.6 - 33.0 pg   MCHC 32.8 31.5 - 35.7 g/dL   RDW 14.4 11.6 - 15.4 %   Platelets 167 150 - 450 x10E3/uL   Neutrophils 64 Not Estab. %   Lymphs 21 Not Estab. %   Monocytes 9 Not Estab. %  Eos 5 Not Estab. %   Basos 1 Not Estab. %   Neutrophils Absolute 3.8 1.4 - 7.0 x10E3/uL   Lymphocytes Absolute 1.2 0.7 - 3.1 x10E3/uL   Monocytes Absolute 0.6 0.1 - 0.9 x10E3/uL   EOS (ABSOLUTE) 0.3 0.0 - 0.4 x10E3/uL   Basophils Absolute 0.1 0.0 - 0.2 x10E3/uL   Immature Granulocytes 0 Not Estab. %    Immature Grans (Abs) 0.0 0.0 - 0.1 x10E3/uL      Assessment & Plan:   Problem List Items Addressed This Visit      Cardiovascular and Mediastinum   Hypertension associated with diabetes ([place]) - Primary    Blood pressure controlled today. Lab results discussed today. A1C is 7.1%. Continue current regimen. Follow-up in 3 months.        Endocrine   Hyperlipidemia associated with type 2 diabetes mellitus ([place])    Lab result discussed. Cholesterol is controlled on current regimen. Follow-up in 6 months.        Genitourinary   CKD (chronic kidney disease), stage III ([place])    Lab results discussed today. Kidney function is stable. Continue following with nephrology.           Follow up plan: No follow-ups on file.

## 2021-02-17 NOTE — Assessment & Plan Note (Signed)
Lab results discussed today. Kidney function is stable. Continue following with nephrology.

## 2021-02-17 NOTE — Assessment & Plan Note (Addendum)
Lab result discussed. Cholesterol is controlled on current regimen. Follow-up in 6 months.

## 2021-02-18 ENCOUNTER — Other Ambulatory Visit: Payer: Self-pay | Admitting: Nurse Practitioner

## 2021-02-18 DIAGNOSIS — N400 Enlarged prostate without lower urinary tract symptoms: Secondary | ICD-10-CM

## 2021-02-18 NOTE — Telephone Encounter (Signed)
Future appointment 05/06/21. Approved per protocol.  Requested Prescriptions  Pending Prescriptions Disp Refills  . finasteride (PROSCAR) 5 MG tablet [Pharmacy Med Name: FINASTERIDE 5 MG TABLET] 90 tablet 4    Sig: TAKE 1 TABLET BY MOUTH EVERY DAY     Urology: 5-alpha Reductase Inhibitors Passed - 02/18/2021  9:12 AM      Passed - Valid encounter within last 12 months    Recent Outpatient Visits          Yesterday Hypertension associated with diabetes ([place])   [place], Lauren A, NP   2 weeks ago Back pain, unspecified back location, unspecified back pain laterality, unspecified chronicity   [facility] Vigg, Avanti, MD   3 months ago Diabetes mellitus due to underlying condition with diabetic nephropathy, without long-term current use of insulin ([place])   [place], Harlan T, NP   7 months ago Diabetes mellitus due to underlying condition with diabetic nephropathy, without long-term current use of insulin ([place])   [place], Walker T, NP   8 months ago LLQ pain   [facility], Lilia Argue, [place]      Future Appointments            In 3 months Vigg, Avanti, MD [employer], [place]   In 5 months [facility] [place], Herbert Seta, [place]

## 2021-02-22 ENCOUNTER — Ambulatory Visit (INDEPENDENT_AMBULATORY_CARE_PROVIDER_SITE_OTHER): Payer: Medicare Other | Admitting: Internal Medicine

## 2021-02-22 ENCOUNTER — Telehealth: Payer: Self-pay

## 2021-02-22 ENCOUNTER — Other Ambulatory Visit: Payer: Self-pay

## 2021-02-22 ENCOUNTER — Encounter: Payer: Self-pay | Admitting: Internal Medicine

## 2021-02-22 VITALS — BP 120/60 | HR 77 | Temp 98.5°F | Ht 65.83 in | Wt 218.0 lb

## 2021-02-22 DIAGNOSIS — N411 Chronic prostatitis: Secondary | ICD-10-CM

## 2021-02-22 DIAGNOSIS — K219 Gastro-esophageal reflux disease without esophagitis: Secondary | ICD-10-CM | POA: Diagnosis not present

## 2021-02-22 MED ORDER — METHYLPREDNISOLONE 4 MG PO TBPK: ORAL_TABLET | ORAL | 0 refills | Status: DC

## 2021-02-22 NOTE — Telephone Encounter (Signed)
Called pt to schedule 6 week f/u around 04/05/2021 and 5 week lab visit. No answer left vm

## 2021-02-22 NOTE — Progress Notes (Signed)
BP 120/60   Pulse 77   Temp 98.5 F (36.9 C) (Oral)   Ht 5' 5.83" (1.672 m)   Wt 218 lb (98.9 kg)   SpO2 98%   BMI 35.37 kg/m    Subjective:    Patient ID: Thomas Huang, male    DOB: [birth year], 70 y.o.   MRN: [id number]  HPI: Thomas Huang is a 70 y.o. male  Pt had a laminectomy about 15 yrs ago @ [facility]   Back Pain This is a chronic (no sciatica worse with walking - about 200 feet wrosens pain and hurts in his hip jpoint and lower back ) problem.    Chief Complaint  Patient presents with  . MRI results    Back pain, Patient has appt with Ortho on 4/13    Relevant past medical, surgical, family and social history reviewed and updated as indicated. Interim medical history since our last visit reviewed. Allergies and medications reviewed and updated.  Review of Systems  Musculoskeletal: Positive for back pain.    Per HPI unless specifically indicated above     Objective:    BP 120/60   Pulse 77   Temp 98.5 F (36.9 C) (Oral)   Ht 5' 5.83" (1.672 m)   Wt 218 lb (98.9 kg)   SpO2 98%   BMI 35.37 kg/m   Wt Readings from Last 3 Encounters:  02/22/21 218 lb (98.9 kg)  02/17/21 216 lb 12.8 oz (98.3 kg)  02/03/21 215 lb 9.6 oz (97.8 kg)    Physical Exam Vitals and nursing note reviewed.  Constitutional:      General: He is not in acute distress.    Appearance: Normal appearance. He is not ill-appearing or diaphoretic.  HENT:     Head: Normocephalic and atraumatic.     Right Ear: Tympanic membrane and external ear normal. There is no impacted cerumen.     Left Ear: External ear normal.     Nose: No congestion or rhinorrhea.     Mouth/Throat:     Pharynx: No oropharyngeal exudate or posterior oropharyngeal erythema.  Eyes:     Conjunctiva/sclera: Conjunctivae normal.     Pupils: Pupils are equal, round, and reactive to light.  Cardiovascular:     Rate and Rhythm: Normal rate and regular rhythm.     Heart sounds: No murmur heard. No friction  rub. No gallop.   Abdominal:     General: Abdomen is flat. Bowel sounds are normal.     Palpations: Abdomen is soft. There is no mass.     Tenderness: There is no abdominal tenderness.  Musculoskeletal:     Cervical back: Normal range of motion and neck supple. No rigidity or tenderness.     Left lower leg: No edema.  Skin:    General: Skin is warm and dry.  Neurological:     Mental Status: He is alert.     Results for orders placed or performed in visit on 02/03/21  Bayer DCA Hb A1c Waived  Result Value Ref Range   HB A1C (BAYER DCA - WAIVED) 7.1 (H) <7.0 %  Lipid panel  Result Value Ref Range   Cholesterol, Total 137 100 - 199 mg/dL   Triglycerides 128 0 - 149 mg/dL   HDL 32 (L) >39 mg/dL   VLDL Cholesterol Cal 23 5 - 40 mg/dL   LDL Chol Calc (NIH) 82 0 - 99 mg/dL   Chol/HDL Ratio 4.3 0.0 - 5.0 ratio  Basic  metabolic panel  Result Value Ref Range   Glucose 148 (H) 65 - 99 mg/dL   BUN 30 (H) 8 - 27 mg/dL   Creatinine, Ser 2.10 (H) 0.76 - 1.27 mg/dL   eGFR 34 (L) >59 mL/min/1.73   BUN/Creatinine Ratio 14 10 - 24   Sodium 140 134 - 144 mmol/L   Potassium 4.8 3.5 - 5.2 mmol/L   Chloride 107 (H) 96 - 106 mmol/L   CO2 19 (L) 20 - 29 mmol/L   Calcium 9.2 8.6 - 10.2 mg/dL  Urinalysis, Routine w reflex microscopic  Result Value Ref Range   Specific Gravity, UA 1.015 1.005 - 1.030   pH, UA 5.5 5.0 - 7.5   Color, UA Yellow Yellow   Appearance Ur Clear Clear   Leukocytes,UA Negative Negative   Protein,UA Negative Negative/Trace   Glucose, UA Negative Negative   Ketones, UA Negative Negative   RBC, UA Trace (A) Negative   Bilirubin, UA Negative Negative   Urobilinogen, Ur 0.2 0.2 - 1.0 mg/dL   Nitrite, UA Negative Negative  CBC with Differential/Platelet  Result Value Ref Range   WBC 5.9 3.4 - 10.8 x10E3/uL   RBC 4.38 4.14 - 5.80 x10E6/uL   Hemoglobin 13.1 13.0 - 17.7 g/dL   Hematocrit 39.9 37.5 - 51.0 %   MCV 91 79 - 97 fL   MCH 29.9 26.6 - 33.0 pg   MCHC 32.8 31.5  - 35.7 g/dL   RDW 14.4 11.6 - 15.4 %   Platelets 167 150 - 450 x10E3/uL   Neutrophils 64 Not Estab. %   Lymphs 21 Not Estab. %   Monocytes 9 Not Estab. %   Eos 5 Not Estab. %   Basos 1 Not Estab. %   Neutrophils Absolute 3.8 1.4 - 7.0 x10E3/uL   Lymphocytes Absolute 1.2 0.7 - 3.1 x10E3/uL   Monocytes Absolute 0.6 0.1 - 0.9 x10E3/uL   EOS (ABSOLUTE) 0.3 0.0 - 0.4 x10E3/uL   Basophils Absolute 0.1 0.0 - 0.2 x10E3/uL   Immature Granulocytes 0 Not Estab. %   Immature Grans (Abs) 0.0 0.0 - 0.1 x10E3/uL        Current Outpatient Medications:  .  atorvastatin (LIPITOR) 10 MG tablet, TAKE 1 TABLET BY MOUTH EVERY DAY, Disp: 90 tablet, Rfl: 0 .  finasteride (PROSCAR) 5 MG tablet, TAKE 1 TABLET BY MOUTH EVERY DAY, Disp: 90 tablet, Rfl: 1 .  fluticasone (FLONASE) 50 MCG/ACT nasal spray, Place 1 spray into both nostrils as needed for allergies or rhinitis., Disp: , Rfl:  .  glimepiride (AMARYL) 2 MG tablet, Take 1 tablet (2 mg total) by mouth 2 (two) times daily before a meal., Disp: 180 tablet, Rfl: 4 .  glucose blood test strip, 1 each by Other route as needed for other. Use as instructed, Disp: , Rfl:  .  lisinopril (ZESTRIL) 10 MG tablet, TAKE 1 TABLET BY MOUTH EVERY DAY, Disp: 90 tablet, Rfl: 4 .  omeprazole (PRILOSEC) 20 MG capsule, TAKE 1 CAPSULE BY MOUTH EVERY DAY, Disp: 90 capsule, Rfl: 3 .  polyethylene glycol (MIRALAX / GLYCOLAX) packet, Take 17 g by mouth daily as needed., Disp: , Rfl:  .  sildenafil (REVATIO) 20 MG tablet, Take 1 tablet (20 mg total) by mouth once as needed. Take 1-5 prn ED, Disp: 50 tablet, Rfl: 12 .  tamsulosin (FLOMAX) 0.4 MG CAPS capsule, Take 1 capsule (0.4 mg total) by mouth daily., Disp: 90 capsule, Rfl: 4    Assessment & Plan:  1. CKD  Results for Thomas Huang, Thomas Huang (MRN [id number]) as of 02/22/2021 09:56 worsening sees [facility] in [place]  Sees them back in July.    Ref. Range 12/07/2019 13:55 07/05/2020 11:33 02/03/2021 09:01  BUN Latest Ref Range: 8 - 27 mg/dL  26 26 30  (H)  Creatinine Latest Ref Range: 0.76 - 1.27 mg/dL 1.87 (H) 2.15 (H) 2.10 (H)   2. DM take amaryl - till takes medrol dose pak - a1c 7.1 since he is 70 yrs old  cannot take metformin check HbA1c,  urine  microalbumin  diabetic diet plan given to pt  adviced regarding hypoglycemia and instructions given to pt today on how to prevent and treat the same if it were to occur. pt acknowledges the plan and voices understanding of the same.  exercise plan given and encouraged.   advice diabetic yearly podiatry, ophthalmology , nutritionist , dental check q 6 months, 3. Back pain  Start medrol dose pak sees surgeons for the back was referred to them last visit.  L1 - l2 worse  T12-L1: Central disc extrusion extending slightly above and below disc levels. Mild canal stenosis. No foraminal stenosis. Appearance is similar.  L1-L2: Disc bulge with endplate osteophytic ridging and superimposed small central protrusion. Mild facet arthropathy with ligamentum flavum infolding. Increased moderate canal stenosis. Partial effacement of the subarticular recesses. Increased mild to moderate foraminal stenosis.  L2-L3: Disc bulge with endplate osteophytic ridging. Facet arthropathy with ligamentum flavum infolding. Mild to moderate canal stenosis. Mild foraminal stenosis. Appearance is similar.  L3-L4: Disc bulge with prominent foraminal components and central disc extrusion extending slightly above and below the disc level. Endplate osteophytic ridging and facet arthropathy. Posterior decompression. Partial effacement of the subarticular recesses. Moderate foraminal stenosis. Appearance is similar.  L4-L5: Disc bulge with prominent foraminal components. Endplate osteophytic ridging and facet arthropathy. Posterior decompression. Partial effacement of the subarticular recesses. Moderate foraminal stenosis. Appearance is similar.  L5-S1: Disc bulge with endplate osteophytic ridging. Marked  facet arthropathy with ligamentum flavum infolding. Moderate canal stenosis with effacement of subarticular recesses. Moderate foraminal stenosis. Appearance is similar.  S1-S2: Facet arthropathy. Mild right foraminal stenosis. Appearance is similar.  IMPRESSION: Multilevel degenerative changes as detailed above overall similar in appearance to the prior study. There is increased stenosis at L1-L2.    Follow up plan: No follow-ups on file.

## 2021-02-22 NOTE — Telephone Encounter (Signed)
Called to schedule lab appt in 5 weeks no answer left vm

## 2021-02-22 NOTE — Patient Instructions (Signed)
Hypoglycemia--Hypoglycemia is a common side effect, although it is generally not severe. In a systematic review of 25 trials comparing sulfonylureas with GLP-1 receptor agonists or DPP-4 inhibitors in patients with type 2 diabetes, severe hypoglycemia (requiring assistance from others) occurred in 0.8 percent of sulfonylurea users, whereas hypoglycemia with glucose ?56 mg/dL (3.1 mmol/L) or ?50 mg/dL (2.8 mmol/L) occurred in 10.1 and 5.9 percent, respectively [32]. In a subsequent observational study using the [place] Clinical Practice Research Datalink, the initiation of monotherapy with sulfonylureas compared with metformin was associated with a higher risk of hypoglycemia requiring hospitalization (4.1 versus 0.9 per 1000 patient-years, HR 4.53 [95% CI 2.76-7.45]) [33].

## 2021-03-08 DIAGNOSIS — M5136 Other intervertebral disc degeneration, lumbar region: Secondary | ICD-10-CM | POA: Diagnosis not present

## 2021-03-08 DIAGNOSIS — M47816 Spondylosis without myelopathy or radiculopathy, lumbar region: Secondary | ICD-10-CM | POA: Diagnosis not present

## 2021-03-08 DIAGNOSIS — M48062 Spinal stenosis, lumbar region with neurogenic claudication: Secondary | ICD-10-CM | POA: Diagnosis not present

## 2021-03-22 ENCOUNTER — Telehealth: Payer: Self-pay

## 2021-03-22 ENCOUNTER — Encounter: Payer: Self-pay | Admitting: Internal Medicine

## 2021-03-22 NOTE — Telephone Encounter (Signed)
Lvm to r/s 5/11 appt 

## 2021-03-28 DIAGNOSIS — I129 Hypertensive chronic kidney disease with stage 1 through stage 4 chronic kidney disease, or unspecified chronic kidney disease: Secondary | ICD-10-CM | POA: Diagnosis not present

## 2021-03-28 DIAGNOSIS — R011 Cardiac murmur, unspecified: Secondary | ICD-10-CM | POA: Diagnosis not present

## 2021-03-28 DIAGNOSIS — N183 Chronic kidney disease, stage 3 unspecified: Secondary | ICD-10-CM | POA: Diagnosis not present

## 2021-03-28 DIAGNOSIS — G4733 Obstructive sleep apnea (adult) (pediatric): Secondary | ICD-10-CM | POA: Diagnosis not present

## 2021-03-28 DIAGNOSIS — E119 Type 2 diabetes mellitus without complications: Secondary | ICD-10-CM | POA: Diagnosis not present

## 2021-03-28 DIAGNOSIS — I10 Essential (primary) hypertension: Secondary | ICD-10-CM | POA: Diagnosis not present

## 2021-03-28 DIAGNOSIS — Z01818 Encounter for other preprocedural examination: Secondary | ICD-10-CM | POA: Diagnosis not present

## 2021-03-28 DIAGNOSIS — K219 Gastro-esophageal reflux disease without esophagitis: Secondary | ICD-10-CM | POA: Diagnosis not present

## 2021-03-28 DIAGNOSIS — K589 Irritable bowel syndrome without diarrhea: Secondary | ICD-10-CM | POA: Diagnosis not present

## 2021-03-28 DIAGNOSIS — K862 Cyst of pancreas: Secondary | ICD-10-CM | POA: Diagnosis not present

## 2021-03-28 DIAGNOSIS — K863 Pseudocyst of pancreas: Secondary | ICD-10-CM | POA: Diagnosis not present

## 2021-03-28 DIAGNOSIS — D631 Anemia in chronic kidney disease: Secondary | ICD-10-CM | POA: Diagnosis not present

## 2021-03-28 DIAGNOSIS — E1122 Type 2 diabetes mellitus with diabetic chronic kidney disease: Secondary | ICD-10-CM | POA: Diagnosis not present

## 2021-03-28 DIAGNOSIS — N1832 Chronic kidney disease, stage 3b: Secondary | ICD-10-CM | POA: Diagnosis not present

## 2021-03-29 ENCOUNTER — Other Ambulatory Visit: Payer: Medicare Other

## 2021-04-05 ENCOUNTER — Other Ambulatory Visit: Payer: Self-pay | Admitting: Nurse Practitioner

## 2021-04-05 ENCOUNTER — Ambulatory Visit: Payer: Medicare Other | Admitting: Internal Medicine

## 2021-04-05 DIAGNOSIS — E1169 Type 2 diabetes mellitus with other specified complication: Secondary | ICD-10-CM

## 2021-04-05 DIAGNOSIS — E785 Hyperlipidemia, unspecified: Secondary | ICD-10-CM

## 2021-04-05 NOTE — Telephone Encounter (Signed)
Requested Prescriptions  Pending Prescriptions Disp Refills  . atorvastatin (LIPITOR) 10 MG tablet [Pharmacy Med Name: ATORVASTATIN 10 MG TABLET] 90 tablet 0    Sig: TAKE 1 TABLET BY MOUTH EVERY DAY     Cardiovascular:  Antilipid - Statins Failed - 04/05/2021  3:17 AM      Failed - HDL in normal range and within 360 days    HDL  Date Value Ref Range Status  02/03/2021 32 (L) >39 mg/dL Final         Passed - Total Cholesterol in normal range and within 360 days    Cholesterol, Total  Date Value Ref Range Status  02/03/2021 137 100 - 199 mg/dL Final   Cholesterol Piccolo, Waived  Date Value Ref Range Status  06/19/2018 128 <200 mg/dL Final    Comment:                            Desirable                <200                         Borderline High      200- 239                         High                     >239          Passed - LDL in normal range and within 360 days    LDL Chol Calc (NIH)  Date Value Ref Range Status  02/03/2021 82 0 - 99 mg/dL Final         Passed - Triglycerides in normal range and within 360 days    Triglycerides  Date Value Ref Range Status  02/03/2021 128 0 - 149 mg/dL Final   Triglycerides Piccolo,Waived  Date Value Ref Range Status  06/19/2018 163 (H) <150 mg/dL Final    Comment:                            Normal                   <150                         Borderline High     150 - 199                         High                200 - 499                         Very High                >499          Passed - Patient is not pregnant      Passed - Valid encounter within last 12 months    Recent Outpatient Visits          1 month 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   [place] Vigg, Avanti, MD   1 month ago Hypertension associated with diabetes ([place])   [place] Mc[place], Lauren A,  NP   2 months ago Back pain, unspecified back location, unspecified back pain laterality, unspecified chronicity   [facility] Vigg, Avanti, MD   5 months ago Diabetes mellitus due to underlying condition with diabetic nephropathy, without long-term current use of insulin ([place])   [place], Jolene T, NP   9 months ago Diabetes mellitus due to underlying condition with diabetic nephropathy, without long-term current use of insulin ([place])   [place], Barbaraann Faster, NP      Future Appointments            In 1 month Vigg, Avanti, MD [facility] [place]   In 4 months Sninsky, Herbert Seta, [place]

## 2021-04-08 DIAGNOSIS — Z20822 Contact with and (suspected) exposure to covid-19: Secondary | ICD-10-CM | POA: Diagnosis not present

## 2021-04-11 DIAGNOSIS — S0990XA Unspecified injury of head, initial encounter: Secondary | ICD-10-CM | POA: Diagnosis not present

## 2021-04-11 DIAGNOSIS — N183 Chronic kidney disease, stage 3 unspecified: Secondary | ICD-10-CM | POA: Diagnosis not present

## 2021-04-11 DIAGNOSIS — M5116 Intervertebral disc disorders with radiculopathy, lumbar region: Secondary | ICD-10-CM | POA: Diagnosis not present

## 2021-04-11 DIAGNOSIS — I129 Hypertensive chronic kidney disease with stage 1 through stage 4 chronic kidney disease, or unspecified chronic kidney disease: Secondary | ICD-10-CM | POA: Diagnosis not present

## 2021-04-11 DIAGNOSIS — Z6834 Body mass index (BMI) 34.0-34.9, adult: Secondary | ICD-10-CM | POA: Diagnosis not present

## 2021-04-11 DIAGNOSIS — M4804 Spinal stenosis, thoracic region: Secondary | ICD-10-CM | POA: Diagnosis not present

## 2021-04-11 DIAGNOSIS — M48062 Spinal stenosis, lumbar region with neurogenic claudication: Secondary | ICD-10-CM | POA: Diagnosis not present

## 2021-04-11 DIAGNOSIS — E785 Hyperlipidemia, unspecified: Secondary | ICD-10-CM | POA: Diagnosis not present

## 2021-04-11 DIAGNOSIS — G9741 Accidental puncture or laceration of dura during a procedure: Secondary | ICD-10-CM | POA: Diagnosis not present

## 2021-04-11 DIAGNOSIS — E1122 Type 2 diabetes mellitus with diabetic chronic kidney disease: Secondary | ICD-10-CM | POA: Diagnosis not present

## 2021-04-11 DIAGNOSIS — W1839XA Other fall on same level, initial encounter: Secondary | ICD-10-CM | POA: Diagnosis not present

## 2021-04-11 DIAGNOSIS — I358 Other nonrheumatic aortic valve disorders: Secondary | ICD-10-CM | POA: Diagnosis not present

## 2021-04-11 DIAGNOSIS — M62838 Other muscle spasm: Secondary | ICD-10-CM | POA: Diagnosis not present

## 2021-04-11 DIAGNOSIS — Z7984 Long term (current) use of oral hypoglycemic drugs: Secondary | ICD-10-CM | POA: Diagnosis not present

## 2021-04-11 DIAGNOSIS — M4805 Spinal stenosis, thoracolumbar region: Secondary | ICD-10-CM | POA: Diagnosis not present

## 2021-04-11 DIAGNOSIS — E669 Obesity, unspecified: Secondary | ICD-10-CM | POA: Diagnosis not present

## 2021-04-11 DIAGNOSIS — N400 Enlarged prostate without lower urinary tract symptoms: Secondary | ICD-10-CM | POA: Diagnosis not present

## 2021-04-15 DIAGNOSIS — R011 Cardiac murmur, unspecified: Secondary | ICD-10-CM | POA: Diagnosis not present

## 2021-04-15 DIAGNOSIS — Z6834 Body mass index (BMI) 34.0-34.9, adult: Secondary | ICD-10-CM | POA: Diagnosis not present

## 2021-04-15 DIAGNOSIS — M1990 Unspecified osteoarthritis, unspecified site: Secondary | ICD-10-CM | POA: Diagnosis not present

## 2021-04-15 DIAGNOSIS — Z9181 History of falling: Secondary | ICD-10-CM | POA: Diagnosis not present

## 2021-04-15 DIAGNOSIS — M5116 Intervertebral disc disorders with radiculopathy, lumbar region: Secondary | ICD-10-CM | POA: Diagnosis not present

## 2021-04-15 DIAGNOSIS — Z7984 Long term (current) use of oral hypoglycemic drugs: Secondary | ICD-10-CM | POA: Diagnosis not present

## 2021-04-15 DIAGNOSIS — K862 Cyst of pancreas: Secondary | ICD-10-CM | POA: Diagnosis not present

## 2021-04-15 DIAGNOSIS — E669 Obesity, unspecified: Secondary | ICD-10-CM | POA: Diagnosis not present

## 2021-04-15 DIAGNOSIS — Z4789 Encounter for other orthopedic aftercare: Secondary | ICD-10-CM | POA: Diagnosis not present

## 2021-04-15 DIAGNOSIS — Z87442 Personal history of urinary calculi: Secondary | ICD-10-CM | POA: Diagnosis not present

## 2021-04-15 DIAGNOSIS — E1122 Type 2 diabetes mellitus with diabetic chronic kidney disease: Secondary | ICD-10-CM | POA: Diagnosis not present

## 2021-04-15 DIAGNOSIS — N411 Chronic prostatitis: Secondary | ICD-10-CM | POA: Diagnosis not present

## 2021-04-15 DIAGNOSIS — G4730 Sleep apnea, unspecified: Secondary | ICD-10-CM | POA: Diagnosis not present

## 2021-04-15 DIAGNOSIS — N200 Calculus of kidney: Secondary | ICD-10-CM | POA: Diagnosis not present

## 2021-04-15 DIAGNOSIS — Z7951 Long term (current) use of inhaled steroids: Secondary | ICD-10-CM | POA: Diagnosis not present

## 2021-04-15 DIAGNOSIS — Z9089 Acquired absence of other organs: Secondary | ICD-10-CM | POA: Diagnosis not present

## 2021-04-15 DIAGNOSIS — G8929 Other chronic pain: Secondary | ICD-10-CM | POA: Diagnosis not present

## 2021-04-15 DIAGNOSIS — K219 Gastro-esophageal reflux disease without esophagitis: Secondary | ICD-10-CM | POA: Diagnosis not present

## 2021-04-15 DIAGNOSIS — I129 Hypertensive chronic kidney disease with stage 1 through stage 4 chronic kidney disease, or unspecified chronic kidney disease: Secondary | ICD-10-CM | POA: Diagnosis not present

## 2021-04-15 DIAGNOSIS — N183 Chronic kidney disease, stage 3 unspecified: Secondary | ICD-10-CM | POA: Diagnosis not present

## 2021-04-15 DIAGNOSIS — N400 Enlarged prostate without lower urinary tract symptoms: Secondary | ICD-10-CM | POA: Diagnosis not present

## 2021-04-20 ENCOUNTER — Telehealth: Payer: Self-pay | Admitting: Internal Medicine

## 2021-04-20 NOTE — Telephone Encounter (Signed)
Home Health Verbal Orders - Caller/Agency: [facility] Number: [phone] Requesting PT  Frequency: 1w1 2w4 1w4

## 2021-04-21 NOTE — Telephone Encounter (Signed)
[facility] and verbal orders given as requested.

## 2021-04-25 ENCOUNTER — Telehealth: Payer: Self-pay

## 2021-04-25 NOTE — Telephone Encounter (Signed)
Tiffany from [facility] [phone]) left a vmail on triage line requesting a call back. Returned call, tiffany was not available left message to return call

## 2021-05-15 DIAGNOSIS — E669 Obesity, unspecified: Secondary | ICD-10-CM | POA: Diagnosis not present

## 2021-05-15 DIAGNOSIS — Z9089 Acquired absence of other organs: Secondary | ICD-10-CM | POA: Diagnosis not present

## 2021-05-15 DIAGNOSIS — M5116 Intervertebral disc disorders with radiculopathy, lumbar region: Secondary | ICD-10-CM | POA: Diagnosis not present

## 2021-05-15 DIAGNOSIS — R011 Cardiac murmur, unspecified: Secondary | ICD-10-CM | POA: Diagnosis not present

## 2021-05-15 DIAGNOSIS — Z4789 Encounter for other orthopedic aftercare: Secondary | ICD-10-CM | POA: Diagnosis not present

## 2021-05-15 DIAGNOSIS — N411 Chronic prostatitis: Secondary | ICD-10-CM | POA: Diagnosis not present

## 2021-05-15 DIAGNOSIS — Z7984 Long term (current) use of oral hypoglycemic drugs: Secondary | ICD-10-CM | POA: Diagnosis not present

## 2021-05-15 DIAGNOSIS — Z87442 Personal history of urinary calculi: Secondary | ICD-10-CM | POA: Diagnosis not present

## 2021-05-15 DIAGNOSIS — K862 Cyst of pancreas: Secondary | ICD-10-CM | POA: Diagnosis not present

## 2021-05-15 DIAGNOSIS — E1122 Type 2 diabetes mellitus with diabetic chronic kidney disease: Secondary | ICD-10-CM | POA: Diagnosis not present

## 2021-05-15 DIAGNOSIS — K219 Gastro-esophageal reflux disease without esophagitis: Secondary | ICD-10-CM | POA: Diagnosis not present

## 2021-05-15 DIAGNOSIS — Z6834 Body mass index (BMI) 34.0-34.9, adult: Secondary | ICD-10-CM | POA: Diagnosis not present

## 2021-05-15 DIAGNOSIS — Z7951 Long term (current) use of inhaled steroids: Secondary | ICD-10-CM | POA: Diagnosis not present

## 2021-05-15 DIAGNOSIS — I129 Hypertensive chronic kidney disease with stage 1 through stage 4 chronic kidney disease, or unspecified chronic kidney disease: Secondary | ICD-10-CM | POA: Diagnosis not present

## 2021-05-15 DIAGNOSIS — G4730 Sleep apnea, unspecified: Secondary | ICD-10-CM | POA: Diagnosis not present

## 2021-05-15 DIAGNOSIS — N200 Calculus of kidney: Secondary | ICD-10-CM | POA: Diagnosis not present

## 2021-05-15 DIAGNOSIS — N400 Enlarged prostate without lower urinary tract symptoms: Secondary | ICD-10-CM | POA: Diagnosis not present

## 2021-05-15 DIAGNOSIS — N183 Chronic kidney disease, stage 3 unspecified: Secondary | ICD-10-CM | POA: Diagnosis not present

## 2021-05-15 DIAGNOSIS — G8929 Other chronic pain: Secondary | ICD-10-CM | POA: Diagnosis not present

## 2021-05-15 DIAGNOSIS — Z9181 History of falling: Secondary | ICD-10-CM | POA: Diagnosis not present

## 2021-05-15 DIAGNOSIS — M1990 Unspecified osteoarthritis, unspecified site: Secondary | ICD-10-CM | POA: Diagnosis not present

## 2021-05-17 ENCOUNTER — Other Ambulatory Visit: Payer: Medicare Other

## 2021-05-17 DIAGNOSIS — Z4789 Encounter for other orthopedic aftercare: Secondary | ICD-10-CM | POA: Diagnosis not present

## 2021-05-17 DIAGNOSIS — M48062 Spinal stenosis, lumbar region with neurogenic claudication: Secondary | ICD-10-CM | POA: Diagnosis not present

## 2021-05-18 ENCOUNTER — Other Ambulatory Visit: Payer: Self-pay

## 2021-05-18 ENCOUNTER — Other Ambulatory Visit: Payer: Medicare Other

## 2021-05-18 DIAGNOSIS — E0821 Diabetes mellitus due to underlying condition with diabetic nephropathy: Secondary | ICD-10-CM | POA: Diagnosis not present

## 2021-05-18 DIAGNOSIS — E1169 Type 2 diabetes mellitus with other specified complication: Secondary | ICD-10-CM

## 2021-05-18 DIAGNOSIS — E785 Hyperlipidemia, unspecified: Secondary | ICD-10-CM | POA: Diagnosis not present

## 2021-05-18 LAB — BAYER DCA HB A1C WAIVED: HB A1C (BAYER DCA - WAIVED): 6.7 % (ref <7.0)

## 2021-05-19 DIAGNOSIS — N183 Chronic kidney disease, stage 3 unspecified: Secondary | ICD-10-CM | POA: Diagnosis not present

## 2021-05-19 DIAGNOSIS — I10 Essential (primary) hypertension: Secondary | ICD-10-CM | POA: Diagnosis not present

## 2021-05-19 DIAGNOSIS — N179 Acute kidney failure, unspecified: Secondary | ICD-10-CM | POA: Diagnosis not present

## 2021-05-19 DIAGNOSIS — E559 Vitamin D deficiency, unspecified: Secondary | ICD-10-CM | POA: Diagnosis not present

## 2021-05-19 LAB — COMP. METABOLIC PANEL (12)
AST: 23 IU/L (ref 0–40)
Albumin/Globulin Ratio: 1.5 (ref 1.2–2.2)
Albumin: 4.1 g/dL (ref 3.8–4.8)
Alkaline Phosphatase: 80 IU/L (ref 44–121)
BUN/Creatinine Ratio: 12 (ref 10–24)
BUN: 24 mg/dL (ref 8–27)
Bilirubin Total: 0.2 mg/dL (ref 0.0–1.2)
Calcium: 8.9 mg/dL (ref 8.6–10.2)
Chloride: 105 mmol/L (ref 96–106)
Creatinine, Ser: 2.04 mg/dL — ABNORMAL HIGH (ref 0.76–1.27)
Globulin, Total: 2.7 g/dL (ref 1.5–4.5)
Glucose: 104 mg/dL — ABNORMAL HIGH (ref 65–99)
Potassium: 4.4 mmol/L (ref 3.5–5.2)
Sodium: 141 mmol/L (ref 134–144)
Total Protein: 6.8 g/dL (ref 6.0–8.5)
eGFR: 35 mL/min/{1.73_m2} — ABNORMAL LOW (ref >59)

## 2021-05-22 ENCOUNTER — Other Ambulatory Visit: Payer: Self-pay

## 2021-05-22 ENCOUNTER — Encounter: Payer: Self-pay | Admitting: Internal Medicine

## 2021-05-22 ENCOUNTER — Ambulatory Visit (INDEPENDENT_AMBULATORY_CARE_PROVIDER_SITE_OTHER): Payer: Medicare Other | Admitting: Internal Medicine

## 2021-05-22 VITALS — BP 134/80 | HR 81 | Temp 98.6°F | Ht 65.75 in | Wt 208.8 lb

## 2021-05-22 DIAGNOSIS — E1169 Type 2 diabetes mellitus with other specified complication: Secondary | ICD-10-CM

## 2021-05-22 DIAGNOSIS — E119 Type 2 diabetes mellitus without complications: Secondary | ICD-10-CM

## 2021-05-22 DIAGNOSIS — I152 Hypertension secondary to endocrine disorders: Secondary | ICD-10-CM

## 2021-05-22 DIAGNOSIS — K219 Gastro-esophageal reflux disease without esophagitis: Secondary | ICD-10-CM | POA: Diagnosis not present

## 2021-05-22 DIAGNOSIS — Z1329 Encounter for screening for other suspected endocrine disorder: Secondary | ICD-10-CM | POA: Diagnosis not present

## 2021-05-22 DIAGNOSIS — Z125 Encounter for screening for malignant neoplasm of prostate: Secondary | ICD-10-CM | POA: Diagnosis not present

## 2021-05-22 DIAGNOSIS — E1159 Type 2 diabetes mellitus with other circulatory complications: Secondary | ICD-10-CM | POA: Diagnosis not present

## 2021-05-22 DIAGNOSIS — N1832 Chronic kidney disease, stage 3b: Secondary | ICD-10-CM | POA: Diagnosis not present

## 2021-05-22 DIAGNOSIS — E785 Hyperlipidemia, unspecified: Secondary | ICD-10-CM | POA: Diagnosis not present

## 2021-05-22 MED ORDER — DEBROX 6.5 % OT SOLN: 5.0000 [drp] | Freq: Two times a day (BID) | OTIC | 1 refills | Status: DC

## 2021-05-22 NOTE — Progress Notes (Signed)
BP 134/80   Pulse 81   Temp 98.6 F (37 C) (Oral)   Ht 5' 5.75" (1.67 m)   Wt 208 lb 12.8 oz (94.7 kg)   SpO2 98%   BMI 33.96 kg/m    Subjective:    Patient ID: Thomas Huang, male    DOB: 1951-07-25, 70 y.o.   MRN: [id number]  Chief Complaint  Patient presents with   Diabetes   Hypertension   Hyperlipidemia   Ear Fullness    No pain, been feeling stopped up for 2 weeks    HPI: Thomas Huang is a 70 y.o. male  Pain patient with extensive T12-L4 lumbar laminectomy and partial discectomy on the left side.  Postop he had L4 numbness more on the left back extending down the left knee.  He still complains of left knee numbness and pain and difficulty walking secondary to left lower extremity pain despite surgery.  Surgeon is aware of the above and has advised physical therapy to help with the above pain.  Patient has left lower extremity gives way sometimes and does not have as good of control as he had in the past.  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 He has type 2 diabetes mellitus. Pertinent negatives for hypoglycemia include no headaches.  Hypertension Pertinent negatives include no headaches or neck pain.  Hyperlipidemia  Ear Fullness  There is pain in the right ear. This is a recurrent problem. The current episode started in the past 7 days. There has been no fever. Associated symptoms include hearing loss. Pertinent negatives include no abdominal pain, coughing, diarrhea, ear discharge, headaches, neck pain, rash, rhinorrhea, sore throat or vomiting.   Chief Complaint  Patient presents with   Diabetes   Hypertension   Hyperlipidemia   Ear Fullness    No pain, been feeling stopped up for 2 weeks    Relevant past medical, surgical, family and social history reviewed and updated as indicated. Interim medical history since our last visit  reviewed. Allergies and medications reviewed and updated.  Review of Systems  HENT:  Positive for hearing loss. Negative for ear discharge, rhinorrhea and sore throat.   Respiratory:  Negative for cough.   Gastrointestinal:  Negative for abdominal pain, diarrhea and vomiting.  Musculoskeletal:  Negative for neck pain.  Skin:  Negative for rash.  Neurological:  Negative for headaches.   Per HPI unless specifically indicated above     Objective:    BP 134/80   Pulse 81   Temp 98.6 F (37 C) (Oral)   Ht 5' 5.75" (1.67 m)   Wt 208 lb 12.8 oz (94.7 kg)   SpO2 98%   BMI 33.96 kg/m   Wt Readings from Last 3 Encounters:  05/22/21 208 lb 12.8 oz (94.7 kg)  02/22/21 218 lb (98.9 kg)  02/17/21 216 lb 12.8 oz (98.3 kg)    Physical Exam  Results for orders placed or performed in visit on 05/18/21  Comp. Metabolic Panel (12)  Result Value Ref Range   Glucose 104 (H) 65 - 99 mg/dL   BUN 24 8 - 27 mg/dL   Creatinine, Ser 2.04 (H) 0.76 - 1.27 mg/dL   eGFR 35 (L) >59 mL/min/1.73   BUN/Creatinine Ratio 12 10 - 24   Sodium 141 134 - 144 mmol/L   Potassium 4.4 3.5 - 5.2 mmol/L   Chloride 105 96 - 106  mmol/L   Calcium 8.9 8.6 - 10.2 mg/dL   Total Protein 6.8 6.0 - 8.5 g/dL   Albumin 4.1 3.8 - 4.8 g/dL   Globulin, Total 2.7 1.5 - 4.5 g/dL   Albumin/Globulin Ratio 1.5 1.2 - 2.2   Bilirubin Total 0.2 0.0 - 1.2 mg/dL   Alkaline Phosphatase 80 44 - 121 IU/L   AST 23 0 - 40 IU/L  Bayer DCA Hb A1c Waived (STAT)  Result Value Ref Range   HB A1C (BAYER DCA - WAIVED) 6.7 <7.0 %        Current Outpatient Medications:    atorvastatin (LIPITOR) 10 MG tablet, TAKE 1 TABLET BY MOUTH EVERY DAY, Disp: 90 tablet, Rfl: 0   fexofenadine-pseudoephedrine (ALLEGRA-D 24) 180-240 MG 24 hr tablet, Take by mouth., Disp: , Rfl:    finasteride (PROSCAR) 5 MG tablet, TAKE 1 TABLET BY MOUTH EVERY DAY, Disp: 90 tablet, Rfl: 1   fluticasone (FLONASE) 50 MCG/ACT nasal spray, Place 1 spray into both nostrils as  needed for allergies or rhinitis., Disp: , Rfl:    glimepiride (AMARYL) 2 MG tablet, Take 2 mg by mouth 2 (two) times daily., Disp: , Rfl:    glucose blood test strip, 1 each by Other route as needed for other. Use as instructed, Disp: , Rfl:    lisinopril (ZESTRIL) 10 MG tablet, TAKE 1 TABLET BY MOUTH EVERY DAY, Disp: 90 tablet, Rfl: 4   methocarbamol (ROBAXIN) 750 MG tablet, Take 750 mg by mouth 2 (two) times daily., Disp: , Rfl:    omeprazole (PRILOSEC) 20 MG capsule, TAKE 1 CAPSULE BY MOUTH EVERY DAY, Disp: 90 capsule, Rfl: 3   omeprazole (PRILOSEC) 20 MG capsule, Take by mouth., Disp: , Rfl:    polyethylene glycol (MIRALAX / GLYCOLAX) packet, Take 17 g by mouth daily as needed., Disp: , Rfl:    polyethylene glycol powder (GLYCOLAX/MIRALAX) 17 GM/SCOOP powder, Take by mouth., Disp: , Rfl:    sildenafil (REVATIO) 20 MG tablet, Take 1 tablet (20 mg total) by mouth once as needed. Take 1-5 prn ED, Disp: 50 tablet, Rfl: 12   tamsulosin (FLOMAX) 0.4 MG CAPS capsule, Take 1 capsule (0.4 mg total) by mouth daily., Disp: 90 capsule, Rfl: 4    Assessment & Plan:  Back pain sp back  surg as in hpi fu and mx per ortho  Stable.  2. DM  check HbA1c,  urine  microalbumin  diabetic diet plan given to pt  adviced regarding hypoglycemia and instructions given to pt today on how to prevent and treat the same if it were to occur. pt acknowledges the plan and voices understanding of the same.  exercise plan given and encouraged.   advice diabetic yearly podiatry, ophthalmology , nutritionist , dental check q 6 months, A1c - 6.7 continue amaryl for such   3. CKD sees [facility] nephrology for such   4. HLD is on lipitor 10 mg daily for such  recheck FLP, check LFT's work on diet, SE of meds explained to pt. low fat and high fiber diet explained to pt.  5. BPH is on flomax and revatio   6 . GERD strable is on prilosec for such   7.HTN : is on lisinopril 10 mg daily  Continue current meds.  Medication  compliance emphasised. pt advised to keep Bp logs. Pt verbalised understanding of the same. Pt to have a low salt diet . Exercise to reach a goal of at least 150 mins a week.  lifestyle modifications  explained and pt understands importance of the above.  8. Left ear cerumen impaction start pt on debrox ear drops for such    Problem List Items Addressed This Visit   None    Orders Placed This Encounter  Procedures   PSA Total+%Free (Serial)   Comprehensive metabolic panel   CBC with Differential/Platelet   Thyroid Panel With TSH   Bayer DCA Hb A1c Waived   Lipid panel   Microalbumin, Urine Waived     Meds ordered this encounter  Medications   carbamide peroxide (DEBROX) 6.5 % OTIC solution    Sig: Place 5 drops into both ears 2 (two) times daily.    Dispense:  15 mL    Refill:  1     Follow up plan: No follow-ups on file.

## 2021-05-24 DIAGNOSIS — E559 Vitamin D deficiency, unspecified: Secondary | ICD-10-CM | POA: Diagnosis not present

## 2021-05-24 DIAGNOSIS — N183 Chronic kidney disease, stage 3 unspecified: Secondary | ICD-10-CM | POA: Diagnosis not present

## 2021-05-24 DIAGNOSIS — N179 Acute kidney failure, unspecified: Secondary | ICD-10-CM | POA: Diagnosis not present

## 2021-05-24 DIAGNOSIS — Z6833 Body mass index (BMI) 33.0-33.9, adult: Secondary | ICD-10-CM | POA: Diagnosis not present

## 2021-05-24 DIAGNOSIS — I10 Essential (primary) hypertension: Secondary | ICD-10-CM | POA: Diagnosis not present

## 2021-05-30 ENCOUNTER — Other Ambulatory Visit: Payer: Self-pay

## 2021-05-30 ENCOUNTER — Ambulatory Visit (INDEPENDENT_AMBULATORY_CARE_PROVIDER_SITE_OTHER): Payer: Medicare Other

## 2021-05-30 DIAGNOSIS — H6123 Impacted cerumen, bilateral: Secondary | ICD-10-CM

## 2021-05-30 NOTE — Progress Notes (Signed)
Patient presented to have bilateral ear wax cleaning. Both ears were flushed and both eardrums visible at the end.   Patient tolerated well and had no questions or concerns.

## 2021-06-09 DIAGNOSIS — D379 Neoplasm of uncertain behavior of digestive organ, unspecified: Secondary | ICD-10-CM | POA: Diagnosis not present

## 2021-06-09 DIAGNOSIS — M25551 Pain in right hip: Secondary | ICD-10-CM | POA: Diagnosis not present

## 2021-06-09 DIAGNOSIS — M545 Low back pain, unspecified: Secondary | ICD-10-CM | POA: Diagnosis not present

## 2021-06-09 DIAGNOSIS — K862 Cyst of pancreas: Secondary | ICD-10-CM | POA: Diagnosis not present

## 2021-06-09 DIAGNOSIS — R200 Anesthesia of skin: Secondary | ICD-10-CM | POA: Diagnosis not present

## 2021-06-09 DIAGNOSIS — M25552 Pain in left hip: Secondary | ICD-10-CM | POA: Diagnosis not present

## 2021-06-20 ENCOUNTER — Telehealth: Payer: Self-pay | Admitting: *Deleted

## 2021-06-20 DIAGNOSIS — R262 Difficulty in walking, not elsewhere classified: Secondary | ICD-10-CM | POA: Diagnosis not present

## 2021-06-20 DIAGNOSIS — M6281 Muscle weakness (generalized): Secondary | ICD-10-CM | POA: Diagnosis not present

## 2021-06-20 NOTE — Telephone Encounter (Signed)
Mr. Clyatt has called & said that he needs to speak w/office manager. I explained to him she was out of office and he states he has called 6X's and do not like being ignored. He wants a call about a bill on February 17, 2021 that should have been no charge. He said he came in and Dr. Neomia Dear was out of office and they ask Lauren to see him. She stated that he was here for his labs and that Dr. Neomia Dear would go over them at his next visit. He states she walked with him to check out and said no charge. He also states if noone calls him back and he keep being ignored he will call Dr. Jeananne Rama himself. He says he is not trying to be ugly but he just want someone to acknowledge his concern. [phone]

## 2021-06-22 DIAGNOSIS — R262 Difficulty in walking, not elsewhere classified: Secondary | ICD-10-CM | POA: Diagnosis not present

## 2021-06-22 DIAGNOSIS — M6281 Muscle weakness (generalized): Secondary | ICD-10-CM | POA: Diagnosis not present

## 2021-06-26 DIAGNOSIS — M1712 Unilateral primary osteoarthritis, left knee: Secondary | ICD-10-CM | POA: Diagnosis not present

## 2021-06-26 DIAGNOSIS — M170 Bilateral primary osteoarthritis of knee: Secondary | ICD-10-CM | POA: Insufficient documentation

## 2021-06-28 DIAGNOSIS — M7061 Trochanteric bursitis, right hip: Secondary | ICD-10-CM | POA: Diagnosis not present

## 2021-06-28 DIAGNOSIS — M48062 Spinal stenosis, lumbar region with neurogenic claudication: Secondary | ICD-10-CM | POA: Diagnosis not present

## 2021-06-28 DIAGNOSIS — M7062 Trochanteric bursitis, left hip: Secondary | ICD-10-CM | POA: Diagnosis not present

## 2021-07-01 ENCOUNTER — Other Ambulatory Visit: Payer: Self-pay | Admitting: Internal Medicine

## 2021-07-01 DIAGNOSIS — E1169 Type 2 diabetes mellitus with other specified complication: Secondary | ICD-10-CM

## 2021-07-01 DIAGNOSIS — E785 Hyperlipidemia, unspecified: Secondary | ICD-10-CM

## 2021-07-01 NOTE — Telephone Encounter (Signed)
Requested Prescriptions  Pending Prescriptions Disp Refills  . atorvastatin (LIPITOR) 10 MG tablet [Pharmacy Med Name: ATORVASTATIN 10 MG TABLET] 90 tablet 1    Sig: TAKE 1 TABLET BY MOUTH EVERY DAY     Cardiovascular:  Antilipid - Statins Failed - 07/01/2021  9:04 AM      Failed - HDL in normal range and within 360 days    HDL  Date Value Ref Range Status  02/03/2021 32 (L) >39 mg/dL Final         Passed - Total Cholesterol in normal range and within 360 days    Cholesterol, Total  Date Value Ref Range Status  02/03/2021 137 100 - 199 mg/dL Final   Cholesterol Piccolo, Waived  Date Value Ref Range Status  06/19/2018 128 <200 mg/dL Final    Comment:                            Desirable                <200                         Borderline High      200- 239                         High                     >239          Passed - LDL in normal range and within 360 days    LDL Chol Calc (NIH)  Date Value Ref Range Status  02/03/2021 82 0 - 99 mg/dL Final         Passed - Triglycerides in normal range and within 360 days    Triglycerides  Date Value Ref Range Status  02/03/2021 128 0 - 149 mg/dL Final   Triglycerides Piccolo,Waived  Date Value Ref Range Status  06/19/2018 163 (H) <150 mg/dL Final    Comment:                            Normal                   <150                         Borderline High     150 - 199                         High                200 - 499                         Very High                >499          Passed - Patient is not pregnant      Passed - Valid encounter within last 12 months    Recent Outpatient Visits          1 month ago Hyperlipidemia associated with type 2 diabetes mellitus ([place])   [facility] Vigg, Avanti, MD   4 months 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   [facility]  Vigg, Avanti, MD   4 months ago Hypertension associated with diabetes ([place])   [place], Lauren A, NP   4 months  ago Back pain, unspecified back location, unspecified back pain laterality, unspecified chronicity   [place], MD   8 months ago Diabetes mellitus due to underlying condition with diabetic nephropathy, without long-term current use of insulin ([place])   [place], Barbaraann Faster, NP      Future Appointments            In 1 month Sninsky, Herbert Seta, MD [place]   In 4 months Vigg, Avanti, MD [facility]

## 2021-07-04 DIAGNOSIS — M6281 Muscle weakness (generalized): Secondary | ICD-10-CM | POA: Diagnosis not present

## 2021-07-04 DIAGNOSIS — R262 Difficulty in walking, not elsewhere classified: Secondary | ICD-10-CM | POA: Diagnosis not present

## 2021-07-11 DIAGNOSIS — R262 Difficulty in walking, not elsewhere classified: Secondary | ICD-10-CM | POA: Diagnosis not present

## 2021-07-11 DIAGNOSIS — M6281 Muscle weakness (generalized): Secondary | ICD-10-CM | POA: Diagnosis not present

## 2021-07-13 DIAGNOSIS — R262 Difficulty in walking, not elsewhere classified: Secondary | ICD-10-CM | POA: Diagnosis not present

## 2021-07-13 DIAGNOSIS — M6281 Muscle weakness (generalized): Secondary | ICD-10-CM | POA: Diagnosis not present

## 2021-07-18 DIAGNOSIS — R262 Difficulty in walking, not elsewhere classified: Secondary | ICD-10-CM | POA: Diagnosis not present

## 2021-07-18 DIAGNOSIS — M6281 Muscle weakness (generalized): Secondary | ICD-10-CM | POA: Diagnosis not present

## 2021-07-20 DIAGNOSIS — R262 Difficulty in walking, not elsewhere classified: Secondary | ICD-10-CM | POA: Diagnosis not present

## 2021-07-20 DIAGNOSIS — M6281 Muscle weakness (generalized): Secondary | ICD-10-CM | POA: Diagnosis not present

## 2021-07-27 DIAGNOSIS — R262 Difficulty in walking, not elsewhere classified: Secondary | ICD-10-CM | POA: Diagnosis not present

## 2021-07-27 DIAGNOSIS — M6281 Muscle weakness (generalized): Secondary | ICD-10-CM | POA: Diagnosis not present

## 2021-08-05 ENCOUNTER — Other Ambulatory Visit: Payer: Self-pay | Admitting: Nurse Practitioner

## 2021-08-05 DIAGNOSIS — E1121 Type 2 diabetes mellitus with diabetic nephropathy: Secondary | ICD-10-CM

## 2021-08-05 NOTE — Telephone Encounter (Signed)
Requested medication (s) are due for refill today: yes  Requested medication (s) are on the active medication list: historical med  Last refill:  05/22/21  Future visit scheduled: yes  Notes to clinic:  historical provider    Requested Prescriptions  Pending Prescriptions Disp Refills   glimepiride (AMARYL) 2 MG tablet [Pharmacy Med Name: GLIMEPIRIDE 2 MG TABLET] 180 tablet 4    Sig: TAKE 1 TABLET (2 MG TOTAL) BY MOUTH 2 (TWO) TIMES DAILY BEFORE A MEAL.     Endocrinology:  Diabetes - Sulfonylureas Passed - 08/05/2021  3:01 PM      Passed - HBA1C is between 0 and 7.9 and within 180 days    HB A1C (BAYER DCA - WAIVED)  Date Value Ref Range Status  05/18/2021 6.7 <7.0 % Final    Comment:                                          Diabetic Adult            <7.0                                       Healthy Adult        4.3 - 5.7                                                           (DCCT/NGSP) American Diabetes Association's Summary of Glycemic Recommendations for Adults with Diabetes: Hemoglobin A1c <7.0%. More stringent glycemic goals (A1c <6.0%) may further reduce complications at the cost of increased risk of hypoglycemia.           Passed - Valid encounter within last 6 months    Recent Outpatient Visits           2 months ago Hyperlipidemia associated with type 2 diabetes mellitus ([place])   [facility] Vigg, Avanti, MD   5 months 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   [facility] Vigg, Avanti, MD   5 months ago Hypertension associated with diabetes ([place])   [place], Lauren A, NP   6 months ago Back pain, unspecified back location, unspecified back pain laterality, unspecified chronicity   [facility] Vigg, Avanti, MD   9 months ago Diabetes mellitus due to underlying condition with diabetic nephropathy, without long-term current use of insulin ([place])   [place], Barbaraann Faster, NP       Future  Appointments             In 2 weeks Sninsky, Herbert Seta, MD [place]   In 3 months Vigg, Avanti, MD [facility], [facility]

## 2021-08-15 ENCOUNTER — Ambulatory Visit: Payer: Medicare Other | Admitting: Urology

## 2021-08-16 ENCOUNTER — Other Ambulatory Visit: Payer: Self-pay | Admitting: Nurse Practitioner

## 2021-08-16 DIAGNOSIS — N400 Enlarged prostate without lower urinary tract symptoms: Secondary | ICD-10-CM

## 2021-08-21 ENCOUNTER — Other Ambulatory Visit: Payer: Self-pay | Admitting: *Deleted

## 2021-08-21 DIAGNOSIS — N200 Calculus of kidney: Secondary | ICD-10-CM

## 2021-08-22 ENCOUNTER — Ambulatory Visit
Admission: RE | Admit: 2021-08-22 | Discharge: 2021-08-22 | Disposition: A | Discharge status: Home / Self Care | Payer: Medicare Other | Source: Ambulatory Visit | Attending: Urology | Admitting: Urology

## 2021-08-22 ENCOUNTER — Ambulatory Visit (INDEPENDENT_AMBULATORY_CARE_PROVIDER_SITE_OTHER): Payer: Medicare Other | Admitting: Urology

## 2021-08-22 ENCOUNTER — Ambulatory Visit
Admission: RE | Admit: 2021-08-22 | Discharge: 2021-08-22 | Disposition: A | Discharge status: Home / Self Care | Payer: Medicare Other | Attending: Urology | Admitting: Urology

## 2021-08-22 ENCOUNTER — Encounter: Payer: Self-pay | Admitting: Urology

## 2021-08-22 ENCOUNTER — Other Ambulatory Visit: Payer: Self-pay

## 2021-08-22 VITALS — BP 146/76 | HR 80 | Ht 66.0 in | Wt 215.0 lb

## 2021-08-22 DIAGNOSIS — N200 Calculus of kidney: Secondary | ICD-10-CM | POA: Insufficient documentation

## 2021-08-22 DIAGNOSIS — N138 Other obstructive and reflux uropathy: Secondary | ICD-10-CM

## 2021-08-22 DIAGNOSIS — N529 Male erectile dysfunction, unspecified: Secondary | ICD-10-CM

## 2021-08-22 DIAGNOSIS — N401 Enlarged prostate with lower urinary tract symptoms: Secondary | ICD-10-CM | POA: Diagnosis not present

## 2021-08-22 DIAGNOSIS — I878 Other specified disorders of veins: Secondary | ICD-10-CM | POA: Diagnosis not present

## 2021-08-22 MED ORDER — TADALAFIL 20 MG PO TABS: 20.0000 mg | ORAL_TABLET | Freq: Every day | ORAL | 11 refills | Status: DC | PRN

## 2021-08-22 NOTE — Progress Notes (Signed)
   08/22/2021 1:00 PM   Thomas Huang [birth year] [id number]  Reason for visit: Follow up nephrolithiasis, ED, BPH  HPI: 70 year old male who I previously saw 1 year ago for similar issues.  He had recently passed a small stone at that time on the right side.  He has a atrophic left kidney, and to known right nonobstructing renal stones that have been present at least 10 years.  He previously underwent ureteroscopy in the distant past with Dr. Bernardo Heater.  He denies any stone problems or gross hematuria over the last year.  He thinks he may have had uric acid stones in the past, but he could not tolerate potassiums citrate secondary to GI upset.  We discussed considering ureteroscopy and laser lithotripsy for his stones and essentially solitary right kidney, but he defers at this time.  He understands the risks of obstruction on the right with an atrophic left kidney, and return precautions discussed extensively.  Regarding his urinary symptoms, these have been stable on maximal medical therapy with Flomax and finasteride.  These are prescribed by his PCP.  He reports some trouble with erections over the last few months despite 100 mg sildenafil.  We discussed a trial of 20 mg Cialis on demand and risks and benefits discussed.  Trial of Cialis 20 mg on demand Continue maximal medical therapy for BPH RTC 1 year KUB, sooner if problems    Billey Co, MD  [facility] [address] [place], [place] [address] [phone]

## 2021-11-06 ENCOUNTER — Telehealth: Payer: Self-pay

## 2021-11-06 DIAGNOSIS — M7072 Other bursitis of hip, left hip: Secondary | ICD-10-CM | POA: Diagnosis not present

## 2021-11-06 DIAGNOSIS — M7071 Other bursitis of hip, right hip: Secondary | ICD-10-CM | POA: Insufficient documentation

## 2021-11-06 DIAGNOSIS — M170 Bilateral primary osteoarthritis of knee: Secondary | ICD-10-CM | POA: Diagnosis not present

## 2021-11-06 NOTE — Telephone Encounter (Signed)
After hours triage message states patient called with acute right flank pain. He believes he may be passing a stone. Patient was told to go to ED.  Called to follow up with patient, he states that he did not go to the ED because he passed the stone soon after the phone call to the after hours line. He states he is doing well today, symptoms have resolved. Patient does not wish to have a follow up visit at this time. He will keep his annual appointment as scheduled.

## 2021-11-12 ENCOUNTER — Other Ambulatory Visit: Payer: Self-pay | Admitting: Internal Medicine

## 2021-11-12 DIAGNOSIS — N400 Enlarged prostate without lower urinary tract symptoms: Secondary | ICD-10-CM

## 2021-11-13 NOTE — Telephone Encounter (Signed)
Requested Prescriptions  Pending Prescriptions Disp Refills   finasteride (PROSCAR) 5 MG tablet [Pharmacy Med Name: FINASTERIDE 5 MG TABLET] 90 tablet 0    Sig: TAKE 1 TABLET BY MOUTH EVERY DAY     Urology: 5-alpha Reductase Inhibitors Passed - 11/12/2021  9:27 AM      Passed - Valid encounter within last 12 months    Recent Outpatient Visits          5 months ago Hyperlipidemia associated with type 2 diabetes mellitus ([place])   [facility] Vigg, Avanti, MD   8 months 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   [place] Vigg, Avanti, MD   8 months ago Hypertension associated with diabetes ([place])   [place], Lauren A, NP   9 months ago Back pain, unspecified back location, unspecified back pain laterality, unspecified chronicity   [facility] Vigg, Avanti, MD   1 year ago Diabetes mellitus due to underlying condition with diabetic nephropathy, without long-term current use of insulin ([place])   [place], Barbaraann Faster, NP      Future Appointments            In 1 week Vigg, Avanti, MD [facility], [facility]

## 2021-11-14 ENCOUNTER — Other Ambulatory Visit: Payer: Self-pay

## 2021-11-14 ENCOUNTER — Other Ambulatory Visit: Payer: Medicare Other

## 2021-11-14 DIAGNOSIS — Z1329 Encounter for screening for other suspected endocrine disorder: Secondary | ICD-10-CM

## 2021-11-14 DIAGNOSIS — K219 Gastro-esophageal reflux disease without esophagitis: Secondary | ICD-10-CM | POA: Diagnosis not present

## 2021-11-14 DIAGNOSIS — Z125 Encounter for screening for malignant neoplasm of prostate: Secondary | ICD-10-CM

## 2021-11-14 DIAGNOSIS — E785 Hyperlipidemia, unspecified: Secondary | ICD-10-CM

## 2021-11-14 DIAGNOSIS — N1832 Chronic kidney disease, stage 3b: Secondary | ICD-10-CM

## 2021-11-14 DIAGNOSIS — E1169 Type 2 diabetes mellitus with other specified complication: Secondary | ICD-10-CM | POA: Diagnosis not present

## 2021-11-14 DIAGNOSIS — I152 Hypertension secondary to endocrine disorders: Secondary | ICD-10-CM | POA: Diagnosis not present

## 2021-11-14 DIAGNOSIS — E119 Type 2 diabetes mellitus without complications: Secondary | ICD-10-CM | POA: Diagnosis not present

## 2021-11-14 DIAGNOSIS — N200 Calculus of kidney: Secondary | ICD-10-CM

## 2021-11-14 DIAGNOSIS — E1159 Type 2 diabetes mellitus with other circulatory complications: Secondary | ICD-10-CM | POA: Diagnosis not present

## 2021-11-14 LAB — BAYER DCA HB A1C WAIVED: HB A1C (BAYER DCA - WAIVED): 6.7 % — ABNORMAL HIGH (ref 4.8–5.6)

## 2021-11-14 LAB — MICROALBUMIN, URINE WAIVED
Creatinine, Urine Waived: 100 mg/dL (ref 10–300)
Microalb, Ur Waived: 80 mg/L — ABNORMAL HIGH (ref 0–19)

## 2021-11-15 LAB — COMPREHENSIVE METABOLIC PANEL
ALT: 29 IU/L (ref 0–44)
AST: 25 IU/L (ref 0–40)
Albumin/Globulin Ratio: 2.2 (ref 1.2–2.2)
Albumin: 4.6 g/dL (ref 3.8–4.8)
Alkaline Phosphatase: 73 IU/L (ref 44–121)
BUN/Creatinine Ratio: 16 (ref 10–24)
BUN: 32 mg/dL — ABNORMAL HIGH (ref 8–27)
Bilirubin Total: 0.4 mg/dL (ref 0.0–1.2)
CO2: 21 mmol/L (ref 20–29)
Calcium: 9 mg/dL (ref 8.6–10.2)
Chloride: 107 mmol/L — ABNORMAL HIGH (ref 96–106)
Creatinine, Ser: 2 mg/dL — ABNORMAL HIGH (ref 0.76–1.27)
Globulin, Total: 2.1 g/dL (ref 1.5–4.5)
Glucose: 91 mg/dL (ref 70–99)
Potassium: 4.4 mmol/L (ref 3.5–5.2)
Sodium: 140 mmol/L (ref 134–144)
Total Protein: 6.7 g/dL (ref 6.0–8.5)
eGFR: 35 mL/min/{1.73_m2} — ABNORMAL LOW (ref >59)

## 2021-11-15 LAB — THYROID PANEL WITH TSH
Free Thyroxine Index: 1.5 (ref 1.2–4.9)
T3 Uptake Ratio: 25 % (ref 24–39)
T4, Total: 5.9 ug/dL (ref 4.5–12.0)
TSH: 2.13 u[IU]/mL (ref 0.450–4.500)

## 2021-11-15 LAB — CBC WITH DIFFERENTIAL/PLATELET
Basophils Absolute: 0 10*3/uL (ref 0.0–0.2)
Basos: 1 %
EOS (ABSOLUTE): 0.4 10*3/uL (ref 0.0–0.4)
Eos: 5 %
Hematocrit: 39.9 % (ref 37.5–51.0)
Hemoglobin: 13.1 g/dL (ref 13.0–17.7)
Immature Grans (Abs): 0 10*3/uL (ref 0.0–0.1)
Immature Granulocytes: 0 %
Lymphocytes Absolute: 1.5 10*3/uL (ref 0.7–3.1)
Lymphs: 18 %
MCH: 29.4 pg (ref 26.6–33.0)
MCHC: 32.8 g/dL (ref 31.5–35.7)
MCV: 90 fL (ref 79–97)
Monocytes Absolute: 0.6 10*3/uL (ref 0.1–0.9)
Monocytes: 8 %
Neutrophils Absolute: 5.4 10*3/uL (ref 1.4–7.0)
Neutrophils: 68 %
Platelets: 159 10*3/uL (ref 150–450)
RBC: 4.45 x10E6/uL (ref 4.14–5.80)
RDW: 14.5 % (ref 11.6–15.4)
WBC: 7.9 10*3/uL (ref 3.4–10.8)

## 2021-11-15 LAB — LIPID PANEL
Chol/HDL Ratio: 3.8 ratio (ref 0.0–5.0)
Cholesterol, Total: 139 mg/dL (ref 100–199)
HDL: 37 mg/dL — ABNORMAL LOW (ref >39)
LDL Chol Calc (NIH): 81 mg/dL (ref 0–99)
Triglycerides: 118 mg/dL (ref 0–149)
VLDL Cholesterol Cal: 21 mg/dL (ref 5–40)

## 2021-11-15 LAB — PSA TOTAL+% FREE (SERIAL)
PSA, Free Pct: 16.7 %
PSA, Free: 0.05 ng/mL
Prostate Specific Ag, Serum: 0.3 ng/mL (ref 0.0–4.0)

## 2021-11-21 ENCOUNTER — Other Ambulatory Visit: Payer: Self-pay

## 2021-11-21 ENCOUNTER — Encounter: Payer: Self-pay | Admitting: Internal Medicine

## 2021-11-21 ENCOUNTER — Ambulatory Visit (INDEPENDENT_AMBULATORY_CARE_PROVIDER_SITE_OTHER): Payer: Medicare Other | Admitting: Internal Medicine

## 2021-11-21 VITALS — BP 121/73 | HR 76 | Temp 98.5°F | Ht 66.14 in | Wt 207.4 lb

## 2021-11-21 DIAGNOSIS — E1140 Type 2 diabetes mellitus with diabetic neuropathy, unspecified: Secondary | ICD-10-CM | POA: Diagnosis not present

## 2021-11-21 DIAGNOSIS — E785 Hyperlipidemia, unspecified: Secondary | ICD-10-CM

## 2021-11-21 DIAGNOSIS — R200 Anesthesia of skin: Secondary | ICD-10-CM

## 2021-11-21 DIAGNOSIS — E1169 Type 2 diabetes mellitus with other specified complication: Secondary | ICD-10-CM

## 2021-11-21 DIAGNOSIS — Z23 Encounter for immunization: Secondary | ICD-10-CM

## 2021-11-21 MED ORDER — ATORVASTATIN CALCIUM 20 MG PO TABS: 20.0000 mg | ORAL_TABLET | Freq: Every day | ORAL | 5 refills | Status: DC

## 2021-11-21 MED ORDER — GABAPENTIN 100 MG PO CAPS: 100.0000 mg | ORAL_CAPSULE | Freq: Every day | ORAL | 3 refills | Status: DC

## 2021-11-21 NOTE — Progress Notes (Signed)
BP 121/73    Pulse 76    Temp 98.5 F (36.9 C) (Oral)    Ht 5' 6.14" (1.68 m)    Wt 207 lb 6.4 oz (94.1 kg)    SpO2 97%    BMI 33.33 kg/m    Subjective:    Patient ID: Thomas Huang, male    DOB: 1951/05/31, 70 y.o.   MRN: [id number]  Chief Complaint  Patient presents with   Hypertension   chronic prostatitis   Diabetes    HPI: Thomas Huang is a 70 y.o. male  Left lower quadrant pain / discomfort - is not bad has had a ho diverticulosis x 1 year ago had a cscope was diagnosed then per his verbal record. Back pain chronic,  Hypertension This is a chronic problem.  Diabetes He presents for his follow-up (has had some neuropathy has numbness in left leg had back surg in may and made the numbness worse, bottom of feet has gotten worse per pt) diabetic visit. Pertinent negatives for diabetes include no visual change and no weakness.  Neurologic Problem The patient's pertinent negatives include no altered mental status, clumsiness, focal sensory loss, focal weakness, loss of balance, memory loss, near-syncope, slurred speech, syncope, visual change or weakness.   Chief Complaint  Patient presents with   Hypertension   chronic prostatitis   Diabetes    Relevant past medical, surgical, family and social history reviewed and updated as indicated. Interim medical history since our last visit reviewed. Allergies and medications reviewed and updated.  Review of Systems  Cardiovascular:  Negative for near-syncope.  Neurological:  Negative for focal weakness, syncope, weakness and loss of balance.  Psychiatric/Behavioral:  Negative for memory loss.    Per HPI unless specifically indicated above     Objective:    BP 121/73    Pulse 76    Temp 98.5 F (36.9 C) (Oral)    Ht 5' 6.14" (1.68 m)    Wt 207 lb 6.4 oz (94.1 kg)    SpO2 97%    BMI 33.33 kg/m   Wt Readings from Last 3 Encounters:  11/21/21 207 lb 6.4 oz (94.1 kg)  08/22/21 215 lb (97.5 kg)  05/22/21 208 lb 12.8 oz  (94.7 kg)    Physical Exam Vitals and nursing note reviewed.  Constitutional:      General: He is not in acute distress.    Appearance: Normal appearance. He is not ill-appearing or diaphoretic.  HENT:     Head: Normocephalic and atraumatic.     Right Ear: Tympanic membrane and external ear normal. There is no impacted cerumen.     Left Ear: External ear normal.     Nose: No congestion or rhinorrhea.     Mouth/Throat:     Pharynx: No oropharyngeal exudate or posterior oropharyngeal erythema.  Eyes:     Conjunctiva/sclera: Conjunctivae normal.     Pupils: Pupils are equal, round, and reactive to light.  Cardiovascular:     Rate and Rhythm: Normal rate and regular rhythm.     Heart sounds: No murmur heard.   No friction rub. No gallop.  Pulmonary:     Effort: No respiratory distress.     Breath sounds: No stridor. No wheezing or rhonchi.  Chest:     Chest wall: No tenderness.  Abdominal:     General: Abdomen is flat. Bowel sounds are normal.     Palpations: Abdomen is soft. There is no mass.  Tenderness: There is no abdominal tenderness.  Musculoskeletal:        General: No swelling, tenderness, deformity or signs of injury.     Cervical back: Normal range of motion and neck supple. No rigidity or tenderness.     Right lower leg: No edema.     Left lower leg: No edema.  Skin:    General: Skin is warm and dry.  Neurological:     Mental Status: He is alert.     Cranial Nerves: No cranial nerve deficit.     Sensory: No sensory deficit.     Motor: No weakness.  Psychiatric:        Mood and Affect: Mood normal.        Behavior: Behavior normal.    Results for orders placed or performed in visit on 11/14/21  Microalbumin, Urine Waived  Result Value Ref Range   Microalb, Ur Waived 80 (H) 0 - 19 mg/L   Creatinine, Urine Waived 100 10 - 300 mg/dL   Microalb/Creat Ratio 30-300 (H) <30 mg/g  Lipid panel  Result Value Ref Range   Cholesterol, Total 139 100 - 199 mg/dL    Triglycerides 118 0 - 149 mg/dL   HDL 37 (L) >39 mg/dL   VLDL Cholesterol Cal 21 5 - 40 mg/dL   LDL Chol Calc (NIH) 81 0 - 99 mg/dL   Chol/HDL Ratio 3.8 0.0 - 5.0 ratio  Bayer DCA Hb A1c Waived  Result Value Ref Range   HB A1C (BAYER DCA - WAIVED) 6.7 (H) 4.8 - 5.6 %  Thyroid Panel With TSH  Result Value Ref Range   TSH 2.130 0.450 - 4.500 uIU/mL   T4, Total 5.9 4.5 - 12.0 ug/dL   T3 Uptake Ratio 25 24 - 39 %   Free Thyroxine Index 1.5 1.2 - 4.9  CBC with Differential/Platelet  Result Value Ref Range   WBC 7.9 3.4 - 10.8 x10E3/uL   RBC 4.45 4.14 - 5.80 x10E6/uL   Hemoglobin 13.1 13.0 - 17.7 g/dL   Hematocrit 39.9 37.5 - 51.0 %   MCV 90 79 - 97 fL   MCH 29.4 26.6 - 33.0 pg   MCHC 32.8 31.5 - 35.7 g/dL   RDW 14.5 11.6 - 15.4 %   Platelets 159 150 - 450 x10E3/uL   Neutrophils 68 Not Estab. %   Lymphs 18 Not Estab. %   Monocytes 8 Not Estab. %   Eos 5 Not Estab. %   Basos 1 Not Estab. %   Neutrophils Absolute 5.4 1.4 - 7.0 x10E3/uL   Lymphocytes Absolute 1.5 0.7 - 3.1 x10E3/uL   Monocytes Absolute 0.6 0.1 - 0.9 x10E3/uL   EOS (ABSOLUTE) 0.4 0.0 - 0.4 x10E3/uL   Basophils Absolute 0.0 0.0 - 0.2 x10E3/uL   Immature Granulocytes 0 Not Estab. %   Immature Grans (Abs) 0.0 0.0 - 0.1 x10E3/uL  Comprehensive metabolic panel  Result Value Ref Range   Glucose 91 70 - 99 mg/dL   BUN 32 (H) 8 - 27 mg/dL   Creatinine, Ser 2.00 (H) 0.76 - 1.27 mg/dL   eGFR 35 (L) >59 mL/min/1.73   BUN/Creatinine Ratio 16 10 - 24   Sodium 140 134 - 144 mmol/L   Potassium 4.4 3.5 - 5.2 mmol/L   Chloride 107 (H) 96 - 106 mmol/L   CO2 21 20 - 29 mmol/L   Calcium 9.0 8.6 - 10.2 mg/dL   Total Protein 6.7 6.0 - 8.5 g/dL   Albumin 4.6 3.8 -  4.8 g/dL   Globulin, Total 2.1 1.5 - 4.5 g/dL   Albumin/Globulin Ratio 2.2 1.2 - 2.2   Bilirubin Total 0.4 0.0 - 1.2 mg/dL   Alkaline Phosphatase 73 44 - 121 IU/L   AST 25 0 - 40 IU/L   ALT 29 0 - 44 IU/L  PSA Total+%Free (Serial)  Result Value Ref Range    Prostate Specific Ag, Serum 0.3 0.0 - 4.0 ng/mL   PSA, Free 0.05 N/A ng/mL   PSA, Free Pct 16.7 %        Current Outpatient Medications:    fexofenadine-pseudoephedrine (ALLEGRA-D 24) 180-240 MG 24 hr tablet, Take 1 tablet by mouth daily as needed., Disp: , Rfl:    finasteride (PROSCAR) 5 MG tablet, TAKE 1 TABLET BY MOUTH EVERY DAY, Disp: 90 tablet, Rfl: 0   fluticasone (FLONASE) 50 MCG/ACT nasal spray, Place 1 spray into both nostrils as needed for allergies or rhinitis., Disp: , Rfl:    gabapentin (NEURONTIN) 100 MG capsule, Take 1 capsule (100 mg total) by mouth at bedtime., Disp: 30 capsule, Rfl: 3   glimepiride (AMARYL) 2 MG tablet, TAKE 1 TABLET (2 MG TOTAL) BY MOUTH 2 (TWO) TIMES DAILY BEFORE A MEAL., Disp: 180 tablet, Rfl: 4   glucose blood test strip, 1 each by Other route as needed for other. Use as instructed, Disp: , Rfl:    lisinopril (ZESTRIL) 10 MG tablet, TAKE 1 TABLET BY MOUTH EVERY DAY, Disp: 90 tablet, Rfl: 4   omeprazole (PRILOSEC) 20 MG capsule, Take 20 mg by mouth daily as needed., Disp: , Rfl:    polyethylene glycol (MIRALAX / GLYCOLAX) packet, Take 17 g by mouth daily as needed., Disp: , Rfl:    tadalafil (CIALIS) 20 MG tablet, Take 1 tablet (20 mg total) by mouth daily as needed for erectile dysfunction., Disp: 30 tablet, Rfl: 11   tamsulosin (FLOMAX) 0.4 MG CAPS capsule, Take 1 capsule (0.4 mg total) by mouth daily., Disp: 90 capsule, Rfl: 4   atorvastatin (LIPITOR) 20 MG tablet, Take 1 tablet (20 mg total) by mouth daily., Disp: 30 tablet, Rfl: 5    Assessment & Plan:  Pancreatic cyst sees Dr. Donnal Moat [facility] once a year.  Fu and mx per [facility]  2. DM a1c at 6.7 is on amaryl check HbA1c,  urine  microalbumin  diabetic diet plan given to pt  adviced regarding hypoglycemia and instructions given to pt today on how to prevent and treat the same if it were to occur. pt acknowledges the plan and voices understanding of the same.  exercise plan given and encouraged.   advice  diabetic yearly podiatry, ophthalmology , nutritionist , dental check q 6 months   3. CKD Cr at 2.00 EGFR 35 Stable sees nephrology for such   4. GERD is on prilosec for such patient advised to avoid laying down soon after his meals. He took a 2 hours between dinner and bedtime. Avoid spicy food and triggers that he knows food wise that worsen his acid reflux. Patient verbalized understanding of the above. Lifestyle modifications as above discussed with patient.   5. Neuropathy in bil lower extremities:  Will start pt on neurontin  , cautioned about drowsiness to take such at night  [place] neurology referral to determine cause Pt has had back surg x 2 - sees Dr. Radford Pax @ [facility] - to circle back with them as well.   Problem List Items Addressed This Visit       Endocrine  Hyperlipidemia associated with type 2 diabetes mellitus (HCC)   Relevant Medications   atorvastatin (LIPITOR) 20 MG tablet   Controlled type 2 diabetes mellitus with diabetic neuropathy, without long-term current use of insulin (HCC)   Relevant Medications   atorvastatin (LIPITOR) 20 MG tablet   Other Relevant Orders   Ambulatory referral to Podiatry     Other   Need for influenza vaccination - Primary   Relevant Orders   Flu Vaccine QUAD High Dose(Fluad)   Numbness     Orders Placed This Encounter  Procedures   Flu Vaccine QUAD High Dose(Fluad)   Ambulatory referral to Podiatry     Meds ordered this encounter  Medications   atorvastatin (LIPITOR) 20 MG tablet    Sig: Take 1 tablet (20 mg total) by mouth daily.    Dispense:  30 tablet    Refill:  5   gabapentin (NEURONTIN) 100 MG capsule    Sig: Take 1 capsule (100 mg total) by mouth at bedtime.    Dispense:  30 capsule    Refill:  3     Follow up plan: Return in about 6 months (around 05/22/2022).

## 2021-11-22 DIAGNOSIS — M7072 Other bursitis of hip, left hip: Secondary | ICD-10-CM | POA: Diagnosis not present

## 2021-11-22 DIAGNOSIS — M7071 Other bursitis of hip, right hip: Secondary | ICD-10-CM | POA: Diagnosis not present

## 2021-11-28 ENCOUNTER — Other Ambulatory Visit: Payer: Self-pay

## 2021-11-28 ENCOUNTER — Encounter: Payer: Self-pay | Admitting: Podiatry

## 2021-11-28 ENCOUNTER — Ambulatory Visit (INDEPENDENT_AMBULATORY_CARE_PROVIDER_SITE_OTHER): Payer: Medicare Other | Admitting: Podiatry

## 2021-11-28 DIAGNOSIS — E0843 Diabetes mellitus due to underlying condition with diabetic autonomic (poly)neuropathy: Secondary | ICD-10-CM | POA: Diagnosis not present

## 2021-11-28 DIAGNOSIS — M5416 Radiculopathy, lumbar region: Secondary | ICD-10-CM | POA: Diagnosis not present

## 2021-11-28 DIAGNOSIS — M2141 Flat foot [pes planus] (acquired), right foot: Secondary | ICD-10-CM | POA: Diagnosis not present

## 2021-11-28 DIAGNOSIS — M2142 Flat foot [pes planus] (acquired), left foot: Secondary | ICD-10-CM | POA: Diagnosis not present

## 2021-11-28 NOTE — Progress Notes (Signed)
HPI: 71 y.o. male presenting today for evaluation of numbness that is extending on the patient's left lower extremity down into his toes.  Patient states that earlier this year he did have lumbar spine surgery May 2022.  He had been experiencing numbness down the left lower extremity even before surgery.  Patient states that although surgery was 7 months ago he continues to experience numbness down his foot.  He also has a history of diabetes mellitus and most recently his PCP prescribed him gabapentin to take nightly.  He presents for further treatment and evaluation  Past Medical History:  Diagnosis Date   Arthritis    lower back   Carpal tunnel syndrome of left wrist    Chronic kidney disease    Diabetes mellitus, type 2 (HCC)    GERD (gastroesophageal reflux disease)    Heart murmur    followed by PCP   Hyperlipidemia    Sleep apnea    CPAP   Vertigo    mild. several episodes/wk    Past Surgical History:  Procedure Laterality Date   CARPAL TUNNEL RELEASE     CLOSED REDUCTION NASAL FRACTURE N/A 11/05/2017   Procedure: CLOSED REDUCTION NASAL FRACTURE WITH NASAL TURBINATE COBLATION;  Surgeon: Clyde Canterbury, MD;  Location: [facility];  Service: ENT;  Laterality: N/A;  Diabetic - oral meds   LAMINECTOMY     lumbar, [facility]   recurrent kidney stones      Allergies  Allergen Reactions   Ergocalciferol Other (See Comments)    Insomnia   Nsaids     Avoids because of kidney issues   Other    Pollen Extract Other (See Comments)    Sneezing, watery eyes, sinus drainage   Shellfish Allergy Other (See Comments)    Cannot have IVP due. Pt has renal failure   Vitamin D Analogs Other (See Comments)    Insomnia     Physical Exam: General: The patient is alert and oriented x3 in no acute distress.  Dermatology: Skin is warm, dry and supple bilateral lower extremities. Negative for open lesions or macerations.  Vascular: Palpable pedal pulses bilaterally. Capillary refill  within normal limits.  Negative for any significant edema or erythema  Neurological: Light touch and protective threshold grossly intact.  The patient does experience paresthesia with tingling sensation with light touch along the plantar arch of the foot  Musculoskeletal Exam: No pedal deformities noted   Assessment: 1. H/o lumbar spine surgery. May 2022 2.  Diabetes mellitus with peripheral polyneuropathy   Plan of Care:  1. Patient evaluated.  2.  I believe after evaluating the patient the moment the majority of his symptoms and paresthesia with numbness is coming from his lumbar spine. 3.  Recommend that he does take the gabapentin as prescribed by the PCP.  He has not started the prescription yet 4.  Appointment with Pedorthist for custom molded diabetic shoes and insoles 5.  Return to clinic for diabetic shoe fitting.  As needed with me      Edrick Kins, DPM [facility]  Dr. Edrick Kins, DPM    [address], [place] [address]  Office [phone]  Fax [phone]

## 2021-12-06 DIAGNOSIS — M7071 Other bursitis of hip, right hip: Secondary | ICD-10-CM | POA: Diagnosis not present

## 2021-12-06 DIAGNOSIS — M170 Bilateral primary osteoarthritis of knee: Secondary | ICD-10-CM | POA: Diagnosis not present

## 2021-12-06 DIAGNOSIS — M7072 Other bursitis of hip, left hip: Secondary | ICD-10-CM | POA: Diagnosis not present

## 2021-12-08 ENCOUNTER — Other Ambulatory Visit: Payer: Self-pay

## 2021-12-08 ENCOUNTER — Ambulatory Visit: Payer: Medicare Other

## 2021-12-08 ENCOUNTER — Telehealth: Payer: Self-pay | Admitting: Internal Medicine

## 2021-12-08 DIAGNOSIS — E1142 Type 2 diabetes mellitus with diabetic polyneuropathy: Secondary | ICD-10-CM

## 2021-12-08 DIAGNOSIS — M2141 Flat foot [pes planus] (acquired), right foot: Secondary | ICD-10-CM

## 2021-12-08 NOTE — Progress Notes (Signed)
SITUATION Reason for Consult: Evaluation for Prefabricated Diabetic Shoes and Bilateral Custom Diabetic Inserts. Patient / Caregiver Report: Patient would like well fitting shoes  OBJECTIVE DATA: Patient History / Diagnosis:    ICD-10-CM   1. Diabetic polyneuropathy associated with type 2 diabetes mellitus (HCC)  E11.42     2. Pes planus of both feet  M21.41    M21.42       Current or Previous Devices:   None and no history  In-Person Foot Examination: Ulcers & Callousing:   none  Toe / Foot Deformities:   - Pes Planus   Shoe Size: 9.5W  ORTHOTIC RECOMMENDATION Recommended Devices: - 1x pair prefabricated PDAC approved diabetic shoes: V751M 9.5W - 3x pair custom-to-patient vacuum formed diabetic insoles.   GOALS OF SHOES AND INSOLES - Reduce shear and pressure - Reduce / Prevent callus formation - Reduce / Prevent ulceration - Protect the fragile healing compromised diabetic foot.  Patient would benefit from diabetic shoes and inserts as patient has diabetes mellitus and the patient has one or more of the following conditions: - History of partial or complete amputation of the foot - History of previous foot ulceration. - History of pre-ulcerative callus - Peripheral neuropathy with evidence of callus formation - Foot deformity - Poor circulation  ACTIONS PERFORMED Patient was casted for insoles via crush box and measured for shoes via brannock device. Procedure was explained and patient tolerated procedure well. All questions were answered and concerns addressed.  PLAN Patient is to ensure treating physician receives and completes diabetic paperwork. Casts and shoe order are to be held until paperwork is received. Once received patient is to be scheduled for fitting in four weeks.

## 2021-12-08 NOTE — Telephone Encounter (Signed)
Mr. Cullens brought in [place] paperwork that needs to be signed by Dr.Vigg When complete please fax to [phone]. Call patient to inform that it has been faxed. Paperwork was placed in provider folder for completion.

## 2021-12-12 ENCOUNTER — Other Ambulatory Visit: Payer: Self-pay | Admitting: Nurse Practitioner

## 2021-12-12 DIAGNOSIS — N400 Enlarged prostate without lower urinary tract symptoms: Secondary | ICD-10-CM

## 2021-12-12 NOTE — Telephone Encounter (Signed)
Requested medication (s) are due for refill today: Yes  Requested medication (s) are on the active medication list: Yes  Last refill:  12/1.21  Future visit scheduled: Yes  Notes to clinic:  Prescription expired.    Requested Prescriptions  Pending Prescriptions Disp Refills   tamsulosin (FLOMAX) 0.4 MG CAPS capsule [Pharmacy Med Name: Tamsulosin HCl 0.4 MG Oral Capsule] 90 capsule 0    Sig: Take 1 capsule by mouth once daily     Urology: Alpha-Adrenergic Blocker Passed - 12/12/2021 11:23 AM      Passed - Last BP in normal range    BP Readings from Last 1 Encounters:  11/21/21 121/73          Passed - Valid encounter within last 12 months    Recent Outpatient Visits           3 weeks ago Need for influenza vaccination   [facility] Vigg, Avanti, MD   6 months ago Hyperlipidemia associated with type 2 diabetes mellitus ([place])   [facility] Vigg, Avanti, MD   9 months 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   [place] Vigg, Avanti, MD   9 months ago Hypertension associated with diabetes ([place])   [place], Lauren A, NP   10 months ago Back pain, unspecified back location, unspecified back pain laterality, unspecified chronicity   [place], MD       Future Appointments             In 5 months Vigg, Avanti, MD [facility]

## 2021-12-15 ENCOUNTER — Telehealth: Payer: Self-pay | Admitting: Podiatry

## 2021-12-15 NOTE — Telephone Encounter (Signed)
Pt left message asking if we had received the documents  for his diabetic shoes that were faxed to his pcp.  I returned call and left message we have faxed it 2 times and we have not received it back as of now. I also asked pt to call to discuss if he would like me to mail it to his home so he could take it to the pcp.

## 2021-12-19 NOTE — Telephone Encounter (Signed)
Pt called back to report that he is still waiting for this to be completed. He wants to be called once this is completed. [phone]

## 2021-12-19 NOTE — Telephone Encounter (Signed)
Spoke with pt to let him know that [place] faxed paperwork on 12/14/2021. Pt stated that they said they haven't received it. It was refaxed today and we received a confirmation.

## 2021-12-21 ENCOUNTER — Telehealth: Payer: Self-pay | Admitting: *Deleted

## 2021-12-21 NOTE — Telephone Encounter (Signed)
I'm returning your call from earlier this week.  Dawn said she did receive the signed forms for your inserts.  She said she is waiting on the shoe inserts to be sent to [place].  "Okay, I appreciate you calling me back."

## 2021-12-29 ENCOUNTER — Other Ambulatory Visit: Payer: Self-pay | Admitting: Internal Medicine

## 2021-12-29 DIAGNOSIS — E785 Hyperlipidemia, unspecified: Secondary | ICD-10-CM

## 2021-12-29 DIAGNOSIS — E1169 Type 2 diabetes mellitus with other specified complication: Secondary | ICD-10-CM

## 2021-12-29 NOTE — Telephone Encounter (Signed)
Requested medications are due for refill today.  no  Requested medications are on the active medications list.  no  Last refill. 07/01/2021  Future visit scheduled.   yes  Notes to clinic.  10 mg was discontinued 11/21/2021 - Dosage change    Requested Prescriptions  Pending Prescriptions Disp Refills   atorvastatin (LIPITOR) 10 MG tablet [Pharmacy Med Name: ATORVASTATIN 10 MG TABLET] 90 tablet 1    Sig: TAKE 1 TABLET BY MOUTH EVERY DAY     Cardiovascular:  Antilipid - Statins Failed - 12/29/2021  1:55 AM      Failed - Lipid Panel in normal range within the last 12 months    Cholesterol, Total  Date Value Ref Range Status  11/14/2021 139 100 - 199 mg/dL Final   Cholesterol Piccolo, Waived  Date Value Ref Range Status  06/19/2018 128 <200 mg/dL Final    Comment:                            Desirable                <200                         Borderline High      200- 239                         High                     >239    LDL Chol Calc (NIH)  Date Value Ref Range Status  11/14/2021 81 0 - 99 mg/dL Final   HDL  Date Value Ref Range Status  11/14/2021 37 (L) >39 mg/dL Final   Triglycerides  Date Value Ref Range Status  11/14/2021 118 0 - 149 mg/dL Final   Triglycerides Piccolo,Waived  Date Value Ref Range Status  06/19/2018 163 (H) <150 mg/dL Final    Comment:                            Normal                   <150                         Borderline High     150 - 199                         High                200 - 499                         Very High                >499          Passed - Patient is not pregnant      Passed - Valid encounter within last 12 months    Recent Outpatient Visits           1 month ago Need for influenza vaccination   [facility] Vigg, Avanti, MD   7 months ago Hyperlipidemia associated with type 2 diabetes mellitus ([place])   [facility] Vigg, Avanti, MD   10  months 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    [facility] Vigg, Avanti, MD   10 months ago Hypertension associated with diabetes ([place])   [place], Lauren A, NP   10 months ago Back pain, unspecified back location, unspecified back pain laterality, unspecified chronicity   [place], MD       Future Appointments             In 4 months Vigg, Avanti, MD [facility]

## 2022-01-14 ENCOUNTER — Other Ambulatory Visit: Payer: Self-pay | Admitting: Nurse Practitioner

## 2022-01-15 NOTE — Telephone Encounter (Signed)
Requested medication (s) are due for refill today: Amount not specified  Requested medication (s) are on the active medication list: unsure    Last refill: 05/22/21  Future visit scheduled yes 05/22/22  Notes to clinic:Historical provider, amounts not specified  Requested Prescriptions  Pending Prescriptions Disp Refills   omeprazole (PRILOSEC) 20 MG capsule [Pharmacy Med Name: OMEPRAZOLE DR 20 MG CAPSULE] 90 capsule 3    Sig: TAKE 1 [name] DAY     Gastroenterology: Proton Pump Inhibitors Passed - 01/14/2022  9:05 AM      Passed - Valid encounter within last 12 months    Recent Outpatient Visits           1 month ago Need for influenza vaccination   [place] Vigg, Avanti, MD   7 months ago Hyperlipidemia associated with type 2 diabetes mellitus ([place])   [facility] Vigg, Avanti, MD   10 months 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   [place] Vigg, Avanti, MD   11 months ago Hypertension associated with diabetes ([place])   [place], Lauren A, NP   11 months ago Back pain, unspecified back location, unspecified back pain laterality, unspecified chronicity   [place], MD       Future Appointments             In 4 months Vigg, Avanti, MD [facility]

## 2022-01-19 ENCOUNTER — Other Ambulatory Visit: Payer: Self-pay

## 2022-01-19 ENCOUNTER — Ambulatory Visit (INDEPENDENT_AMBULATORY_CARE_PROVIDER_SITE_OTHER): Payer: Medicare Other

## 2022-01-19 DIAGNOSIS — M2141 Flat foot [pes planus] (acquired), right foot: Secondary | ICD-10-CM | POA: Diagnosis not present

## 2022-01-19 DIAGNOSIS — M2142 Flat foot [pes planus] (acquired), left foot: Secondary | ICD-10-CM | POA: Diagnosis not present

## 2022-01-19 DIAGNOSIS — E1142 Type 2 diabetes mellitus with diabetic polyneuropathy: Secondary | ICD-10-CM

## 2022-01-19 DIAGNOSIS — E0843 Diabetes mellitus due to underlying condition with diabetic autonomic (poly)neuropathy: Secondary | ICD-10-CM | POA: Diagnosis not present

## 2022-01-19 NOTE — Progress Notes (Signed)
SITUATION Reason for Visit: Fitting of Diabetic Shoes & Insoles Patient / Caregiver Report:  Patient is satisfied with fit and function of shoes and insoles.  OBJECTIVE DATA: Patient History / Diagnosis:     ICD-10-CM   1. Diabetes mellitus due to underlying condition with diabetic autonomic neuropathy, unspecified whether long term insulin use (HCC)  E08.43     2. Diabetic polyneuropathy associated with type 2 diabetes mellitus (HCC)  E11.42     3. Pes planus of both feet  M21.41    M21.42       Change in Status:   None  ACTIONS PERFORMED: In-Person Delivery, patient was fit with: - 1x pair [id number] PDAC approved prefabricated Diabetic Shoes: Apex V751M 9.5W - 3x pair [id number] PDAC approved vacuum formed custom diabetic insoles; [employer]: [id number]  Shoes and insoles were verified for structural integrity and safety. Patient wore shoes and insoles in office. Skin was inspected and free of areas of concern after wearing shoes and inserts. Shoes and inserts fit properly. Patient / Caregiver provided with ferbal instruction and demonstration regarding donning, doffing, wear, care, proper fit, function, purpose, cleaning, and use of shoes and insoles ' and in all related precautions and risks and benefits regarding shoes and insoles. Patient / Caregiver was instructed to wear properly fitting socks with shoes at all times. Patient was also provided with verbal instruction regarding how to report any failures or malfunctions of shoes or inserts, and necessary follow up care. Patient / Caregiver was also instructed to contact physician regarding change in status that may affect function of shoes and inserts.   Patient / Caregiver verbalized undersatnding of instruction provided. Patient / Caregiver demonstrated independence with proper donning and doffing of shoes and inserts.  PLAN Patient to follow up as needed. Plan of care was discussed with and agreed upon by patient and/or caregiver. All  questions were answered and concerns addressed.

## 2022-02-08 ENCOUNTER — Other Ambulatory Visit: Payer: Self-pay | Admitting: Internal Medicine

## 2022-02-08 NOTE — Telephone Encounter (Signed)
Requested Prescriptions  ?Pending Prescriptions Disp Refills  ?? finasteride (PROSCAR) 5 MG tablet [Pharmacy Med Name: FINASTERIDE 5 MG TABLET] 90 tablet 0  ?  Sig: TAKE 1 TABLET BY MOUTH EVERY DAY  ?  ? Urology: 5-alpha Reductase Inhibitors Passed - 02/08/2022  1:48 AM  ?  ?  Passed - PSA in normal range and within 360 days  ?  Prostate Specific Ag, Serum  ?Date Value Ref Range Status  ?11/14/2021 0.3 0.0 - 4.0 ng/mL Final  ?  Comment:  ?  Roche ECLIA methodology. ?According to the American Urological Association, Serum PSA should ?decrease and remain at undetectable levels after radical ?prostatectomy. The AUA defines biochemical recurrence as an initial ?PSA value 0.2 ng/mL or greater followed by a subsequent confirmatory ?PSA value 0.2 ng/mL or greater. ?Values obtained with different assay methods or kits cannot be used ?interchangeably. Results cannot be interpreted as absolute evidence ?of the presence or absence of malignant disease. ?  ?   ?  ?  Passed - Valid encounter within last 12 months  ?  Recent Outpatient Visits   ?      ? 2 months ago Need for influenza vaccination  ? [facility] Vigg, Avanti, MD  ? 8 months ago Hyperlipidemia associated with type 2 diabetes mellitus ([place])  ? [facility] Vigg, Avanti, MD  ? 11 months 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  ? [facility] Vigg, Avanti, MD  ? 11 months ago Hypertension associated with diabetes ([place])  ? [place], NP  ? 1 year ago Back pain, unspecified back location, unspecified back pain laterality, unspecified chronicity  ? [facility] Vigg, Avanti, MD  ?  ?  ?Future Appointments   ?        ? In 3 months Vigg, Avanti, MD [facility], [facility]  ?  ? ?  ?  ?  ? ?

## 2022-02-15 ENCOUNTER — Other Ambulatory Visit: Payer: Self-pay | Admitting: Internal Medicine

## 2022-02-15 DIAGNOSIS — N1831 Chronic kidney disease, stage 3a: Secondary | ICD-10-CM

## 2022-02-15 DIAGNOSIS — E1159 Type 2 diabetes mellitus with other circulatory complications: Secondary | ICD-10-CM

## 2022-02-19 NOTE — Telephone Encounter (Signed)
Requested Prescriptions  ?Pending Prescriptions Disp Refills  ?? lisinopril (ZESTRIL) 10 MG tablet [Pharmacy Med Name: LISINOPRIL 10 MG TABLET] 90 tablet 4  ?  Sig: TAKE 1 TABLET BY MOUTH EVERY DAY  ?  ? Cardiovascular:  ACE Inhibitors Failed - 02/15/2022  4:24 PM  ?  ?  Failed - Cr in normal range and within 180 days  ?  Creatinine  ?Date Value Ref Range Status  ?05/01/2012 1.81 (H) 0.60 - 1.30 mg/dL Final  ? ?Creatinine, Ser  ?Date Value Ref Range Status  ?11/14/2021 2.00 (H) 0.76 - 1.27 mg/dL Final  ?   ?  ?  Passed - K in normal range and within 180 days  ?  Potassium  ?Date Value Ref Range Status  ?11/14/2021 4.4 3.5 - 5.2 mmol/L Final  ?05/01/2012 4.8 3.5 - 5.1 mmol/L Final  ?   ?  ?  Passed - Patient is not pregnant  ?  ?  Passed - Last BP in normal range  ?  BP Readings from Last 1 Encounters:  ?11/21/21 121/73  ?   ?  ?  Passed - Valid encounter within last 6 months  ?  Recent Outpatient Visits   ?      ? 3 months ago Need for influenza vaccination  ? [facility] Vigg, Avanti, MD  ? 9 months ago Hyperlipidemia associated with type 2 diabetes mellitus ([place])  ? [facility] Vigg, Avanti, MD  ? 12 months 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  ? [facility] Vigg, Avanti, MD  ? 1 year ago Hypertension associated with diabetes ([place])  ? [place], NP  ? 1 year ago Back pain, unspecified back location, unspecified back pain laterality, unspecified chronicity  ? [facility] Vigg, Avanti, MD  ?  ?  ?Future Appointments   ?        ? In 3 months Vigg, Avanti, MD [facility], [facility]  ?  ? ?  ?  ?  ? ?

## 2022-03-15 DIAGNOSIS — Z20822 Contact with and (suspected) exposure to covid-19: Secondary | ICD-10-CM | POA: Diagnosis not present

## 2022-03-22 ENCOUNTER — Other Ambulatory Visit: Payer: Self-pay | Admitting: Internal Medicine

## 2022-03-23 NOTE — Telephone Encounter (Signed)
Requested Prescriptions  ?Pending Prescriptions Disp Refills  ?? gabapentin (NEURONTIN) 100 MG capsule [Pharmacy Med Name: GABAPENTIN 100 MG CAPSULE] 30 capsule 1  ?  Sig: TAKE 1 CAPSULE BY MOUTH AT BEDTIME.  ?  ? Neurology: Anticonvulsants - gabapentin Failed - 03/22/2022 12:01 PM  ?  ?  Failed - Cr in normal range and within 360 days  ?  Creatinine  ?Date Value Ref Range Status  ?05/01/2012 1.81 (H) 0.60 - 1.30 mg/dL Final  ? ?Creatinine, Ser  ?Date Value Ref Range Status  ?11/14/2021 2.00 (H) 0.76 - 1.27 mg/dL Final  ?   ?  ?  Passed - Completed PHQ-2 or PHQ-9 in the last 360 days  ?  ?  Passed - Valid encounter within last 12 months  ?  Recent Outpatient Visits   ?      ? 4 months ago Need for influenza vaccination  ? [facility] Vigg, Avanti, MD  ? 10 months ago Hyperlipidemia associated with type 2 diabetes mellitus ([place])  ? [facility] Vigg, Avanti, MD  ? 1 year 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  ? [facility] Vigg, Avanti, MD  ? 1 year ago Hypertension associated with diabetes ([place])  ? [place], NP  ? 1 year ago Back pain, unspecified back location, unspecified back pain laterality, unspecified chronicity  ? [facility] Vigg, Avanti, MD  ?  ?  ?Future Appointments   ?        ? In 2 months Vigg, Avanti, MD [facility], [facility]  ?  ? ?  ?  ?  ? ? ?

## 2022-04-27 DIAGNOSIS — I10 Essential (primary) hypertension: Secondary | ICD-10-CM | POA: Diagnosis not present

## 2022-04-27 DIAGNOSIS — N183 Chronic kidney disease, stage 3 unspecified: Secondary | ICD-10-CM | POA: Diagnosis not present

## 2022-04-27 DIAGNOSIS — N179 Acute kidney failure, unspecified: Secondary | ICD-10-CM | POA: Diagnosis not present

## 2022-04-27 DIAGNOSIS — E559 Vitamin D deficiency, unspecified: Secondary | ICD-10-CM | POA: Diagnosis not present

## 2022-05-02 DIAGNOSIS — N183 Chronic kidney disease, stage 3 unspecified: Secondary | ICD-10-CM | POA: Diagnosis not present

## 2022-05-02 DIAGNOSIS — N179 Acute kidney failure, unspecified: Secondary | ICD-10-CM | POA: Diagnosis not present

## 2022-05-02 DIAGNOSIS — Z6833 Body mass index (BMI) 33.0-33.9, adult: Secondary | ICD-10-CM | POA: Diagnosis not present

## 2022-05-02 DIAGNOSIS — I10 Essential (primary) hypertension: Secondary | ICD-10-CM | POA: Diagnosis not present

## 2022-05-02 DIAGNOSIS — E559 Vitamin D deficiency, unspecified: Secondary | ICD-10-CM | POA: Diagnosis not present

## 2022-05-06 ENCOUNTER — Other Ambulatory Visit: Payer: Self-pay | Admitting: Internal Medicine

## 2022-05-06 DIAGNOSIS — N400 Enlarged prostate without lower urinary tract symptoms: Secondary | ICD-10-CM

## 2022-05-07 NOTE — Telephone Encounter (Signed)
Requested Prescriptions  Pending Prescriptions Disp Refills  . finasteride (PROSCAR) 5 MG tablet [Pharmacy Med Name: FINASTERIDE 5 MG TABLET] 90 tablet 1    Sig: TAKE 1 TABLET BY MOUTH EVERY DAY     Urology: 5-alpha Reductase Inhibitors Passed - 05/06/2022  9:24 AM      Passed - PSA in normal range and within 360 days    Prostate Specific Ag, Serum  Date Value Ref Range Status  11/14/2021 0.3 0.0 - 4.0 ng/mL Final    Comment:    Roche ECLIA methodology. According to the American Urological Association, Serum PSA should decrease and remain at undetectable levels after radical prostatectomy. The AUA defines biochemical recurrence as an initial PSA value 0.2 ng/mL or greater followed by a subsequent confirmatory PSA value 0.2 ng/mL or greater. Values obtained with different assay methods or kits cannot be used interchangeably. Results cannot be interpreted as absolute evidence of the presence or absence of malignant disease.          Passed - Valid encounter within last 12 months    Recent Outpatient Visits          5 months ago Need for influenza vaccination   [facility] Vigg, Avanti, MD   11 months ago Hyperlipidemia associated with type 2 diabetes mellitus ([place])   [facility] Vigg, Avanti, MD   1 year 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   [place] Vigg, Avanti, MD   1 year ago Hypertension associated with diabetes ([place])   [place], Lauren A, NP   1 year ago Back pain, unspecified back location, unspecified back pain laterality, unspecified chronicity   [place], MD      Future Appointments            In 2 weeks Vigg, Avanti, MD [facility], [facility]

## 2022-05-15 ENCOUNTER — Other Ambulatory Visit: Payer: Medicare Other

## 2022-05-15 ENCOUNTER — Other Ambulatory Visit: Payer: Self-pay

## 2022-05-15 DIAGNOSIS — E1159 Type 2 diabetes mellitus with other circulatory complications: Secondary | ICD-10-CM

## 2022-05-15 DIAGNOSIS — E785 Hyperlipidemia, unspecified: Secondary | ICD-10-CM | POA: Diagnosis not present

## 2022-05-15 DIAGNOSIS — E1169 Type 2 diabetes mellitus with other specified complication: Secondary | ICD-10-CM | POA: Diagnosis not present

## 2022-05-15 DIAGNOSIS — E1140 Type 2 diabetes mellitus with diabetic neuropathy, unspecified: Secondary | ICD-10-CM

## 2022-05-15 DIAGNOSIS — I152 Hypertension secondary to endocrine disorders: Secondary | ICD-10-CM | POA: Diagnosis not present

## 2022-05-15 LAB — URINALYSIS, ROUTINE W REFLEX MICROSCOPIC
Bilirubin, UA: NEGATIVE
Glucose, UA: NEGATIVE
Ketones, UA: NEGATIVE
Leukocytes,UA: NEGATIVE
Nitrite, UA: NEGATIVE
RBC, UA: NEGATIVE
Specific Gravity, UA: 1.02 (ref 1.005–1.030)
Urobilinogen, Ur: 0.2 mg/dL (ref 0.2–1.0)
pH, UA: 5.5 (ref 5.0–7.5)

## 2022-05-15 LAB — MICROSCOPIC EXAMINATION
Bacteria, UA: NONE SEEN
RBC, Urine: NONE SEEN /hpf (ref 0–2)
WBC, UA: NONE SEEN /hpf (ref 0–5)

## 2022-05-15 LAB — MICROALBUMIN, URINE WAIVED
Creatinine, Urine Waived: 200 mg/dL (ref 10–300)
Microalb, Ur Waived: 80 mg/L — ABNORMAL HIGH (ref 0–19)

## 2022-05-15 LAB — BAYER DCA HB A1C WAIVED: HB A1C (BAYER DCA - WAIVED): 6.8 % — ABNORMAL HIGH (ref 4.8–5.6)

## 2022-05-16 ENCOUNTER — Other Ambulatory Visit: Payer: Self-pay | Admitting: Internal Medicine

## 2022-05-16 DIAGNOSIS — E1169 Type 2 diabetes mellitus with other specified complication: Secondary | ICD-10-CM

## 2022-05-16 DIAGNOSIS — E1159 Type 2 diabetes mellitus with other circulatory complications: Secondary | ICD-10-CM

## 2022-05-16 DIAGNOSIS — N1831 Chronic kidney disease, stage 3a: Secondary | ICD-10-CM

## 2022-05-16 LAB — BASIC METABOLIC PANEL
BUN/Creatinine Ratio: 17 (ref 10–24)
BUN: 33 mg/dL — ABNORMAL HIGH (ref 8–27)
CO2: 18 mmol/L — ABNORMAL LOW (ref 20–29)
Calcium: 9.1 mg/dL (ref 8.6–10.2)
Chloride: 106 mmol/L (ref 96–106)
Creatinine, Ser: 1.98 mg/dL — ABNORMAL HIGH (ref 0.76–1.27)
Glucose: 118 mg/dL — ABNORMAL HIGH (ref 70–99)
Potassium: 4.8 mmol/L (ref 3.5–5.2)
Sodium: 141 mmol/L (ref 134–144)
eGFR: 36 mL/min/{1.73_m2} — ABNORMAL LOW (ref >59)

## 2022-05-16 LAB — LIPID PANEL
Chol/HDL Ratio: 4.5 ratio (ref 0.0–5.0)
Cholesterol, Total: 135 mg/dL (ref 100–199)
HDL: 30 mg/dL — ABNORMAL LOW (ref >39)
LDL Chol Calc (NIH): 82 mg/dL (ref 0–99)
Triglycerides: 125 mg/dL (ref 0–149)
VLDL Cholesterol Cal: 23 mg/dL (ref 5–40)

## 2022-05-16 NOTE — Telephone Encounter (Signed)
Requested Prescriptions  Pending Prescriptions Disp Refills  . lisinopril (ZESTRIL) 10 MG tablet [Pharmacy Med Name: LISINOPRIL 10 MG TABLET] 90 tablet 0    Sig: TAKE 1 TABLET BY MOUTH EVERY DAY     Cardiovascular:  ACE Inhibitors Failed - 05/16/2022  1:50 AM      Failed - Cr in normal range and within 180 days    Creatinine  Date Value Ref Range Status  05/01/2012 1.81 (H) 0.60 - 1.30 mg/dL Final   Creatinine, Ser  Date Value Ref Range Status  05/15/2022 1.98 (H) 0.76 - 1.27 mg/dL Final         Failed - K in normal range and within 180 days    Potassium  Date Value Ref Range Status  05/15/2022 4.8 3.5 - 5.2 mmol/L Final  05/01/2012 4.8 3.5 - 5.1 mmol/L Final         Passed - Patient is not pregnant      Passed - Last BP in normal range    BP Readings from Last 1 Encounters:  11/21/21 121/73         Passed - Valid encounter within last 6 months    Recent Outpatient Visits          5 months ago Need for influenza vaccination   [facility] Vigg, Avanti, MD   11 months ago Hyperlipidemia associated with type 2 diabetes mellitus ([place])   [facility] Vigg, Avanti, MD   1 year 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   [facility] Vigg, Avanti, MD   1 year ago Hypertension associated with diabetes ([place])   [place], Lauren A, NP   1 year ago Back pain, unspecified back location, unspecified back pain laterality, unspecified chronicity   [place], Avanti, MD      Future Appointments            In 6 days Vigg, Avanti, MD [facility], [facility]           . atorvastatin (LIPITOR) 20 MG tablet [Pharmacy Med Name: ATORVASTATIN 20 MG TABLET] 90 tablet 1    Sig: TAKE 1 TABLET BY MOUTH EVERY DAY     Cardiovascular:  Antilipid - Statins Failed - 05/16/2022  1:50 AM      Failed - Lipid Panel in normal range within the last 12 months    Cholesterol, Total  Date Value Ref Range Status  05/15/2022 135 100  - 199 mg/dL Final   Cholesterol Piccolo, Waived  Date Value Ref Range Status  06/19/2018 128 <200 mg/dL Final    Comment:                            Desirable                <200                         Borderline High      200- 239                         High                     >239    LDL Chol Calc (NIH)  Date Value Ref Range Status  05/15/2022 82 0 - 99 mg/dL Final   HDL  Date Value Ref Range Status  05/15/2022 30 (L) >39 mg/dL Final   Triglycerides  Date Value Ref Range Status  05/15/2022 125 0 - 149 mg/dL Final   Triglycerides Piccolo,Waived  Date Value Ref Range Status  06/19/2018 163 (H) <150 mg/dL Final    Comment:                            Normal                   <150                         Borderline High     150 - 199                         High                200 - 499                         Very High                >499          Passed - Patient is not pregnant      Passed - Valid encounter within last 12 months    Recent Outpatient Visits          5 months ago Need for influenza vaccination   [facility] Vigg, Avanti, MD   11 months ago Hyperlipidemia associated with type 2 diabetes mellitus ([place])   [facility] Vigg, Avanti, MD   1 year 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   [place] Vigg, Avanti, MD   1 year ago Hypertension associated with diabetes ([place])   [place], Lauren A, NP   1 year ago Back pain, unspecified back location, unspecified back pain laterality, unspecified chronicity   [place], MD      Future Appointments            In 6 days Vigg, Avanti, MD [facility], [facility]

## 2022-05-19 IMAGING — US US RENAL
2 series · 13 of 25 positions shown · non-contrast
Comparison: MRI 05/06/2012, ultrasound 08/15/2010 (report only) no
concerning renal mass. No hydronephrosis.

CLINICAL DATA: Nephrolithiasis, evaluate for hydronephrosis

EXAM:
RENAL / URINARY TRACT ULTRASOUND COMPLETE

[Series 1: us renal · 0.23mm/px · 12 of 46 slices shown (1 of 2)]
[im 1/46]
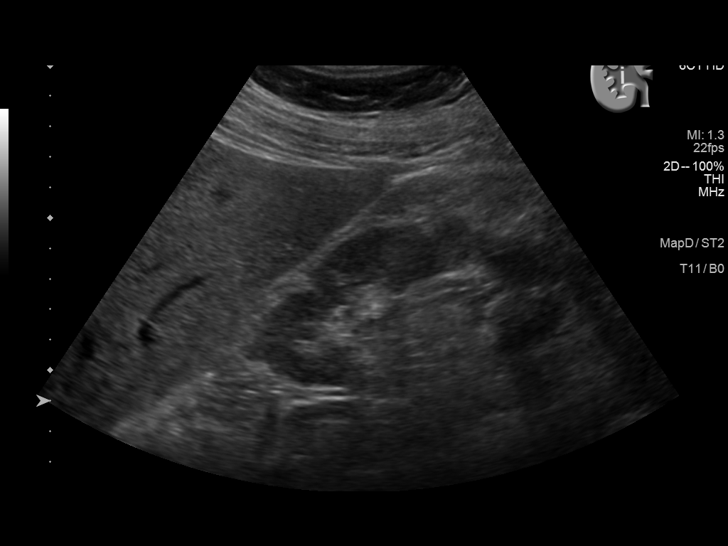
[im 4/46]
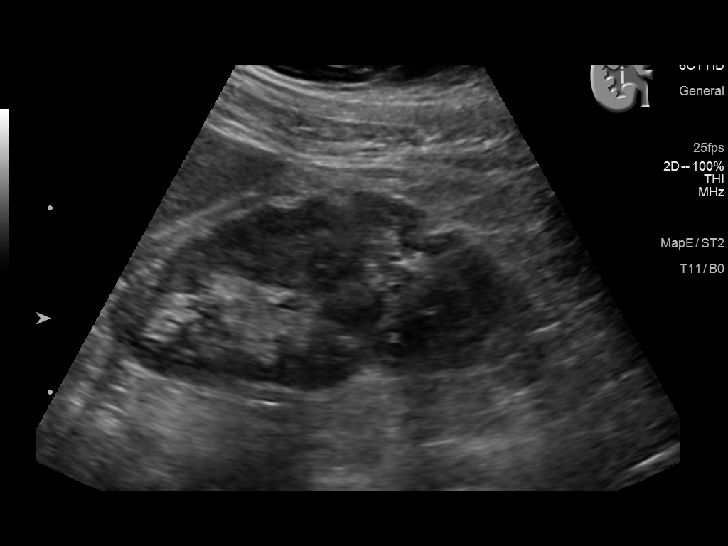
[im 8/46]
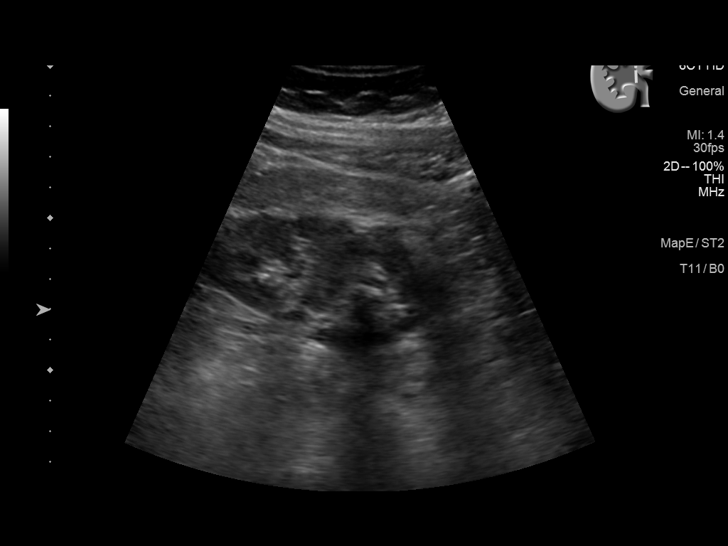
[im 12/46]
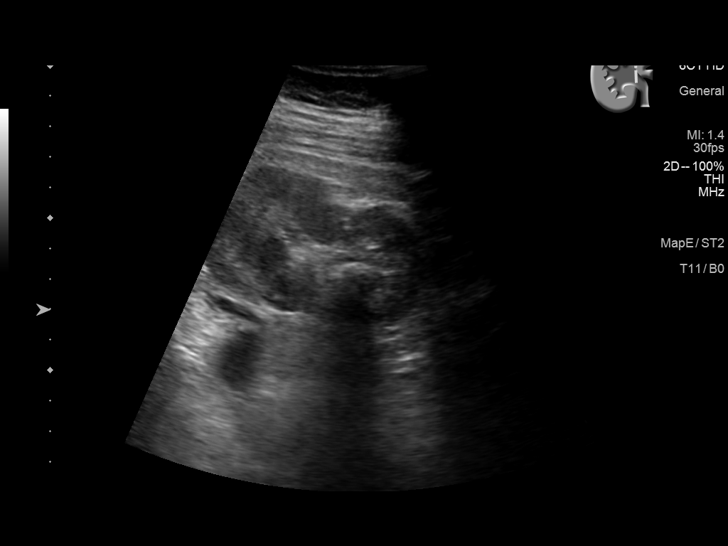
[im 16/46]
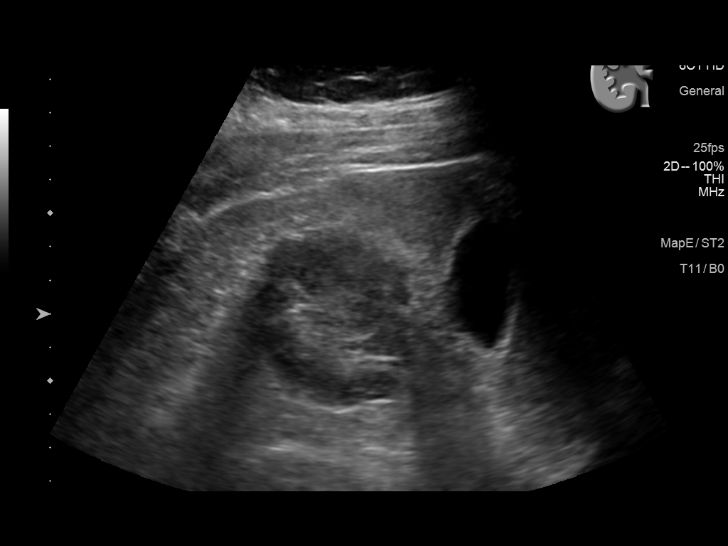
[im 20/46]
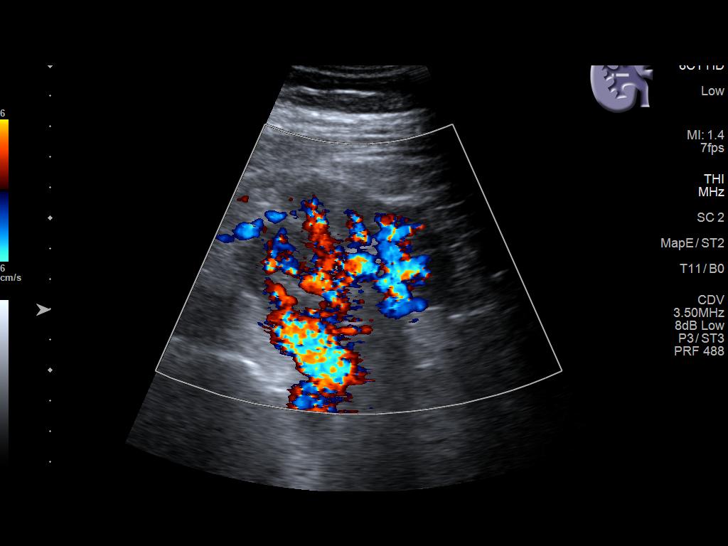
[im 24/46]
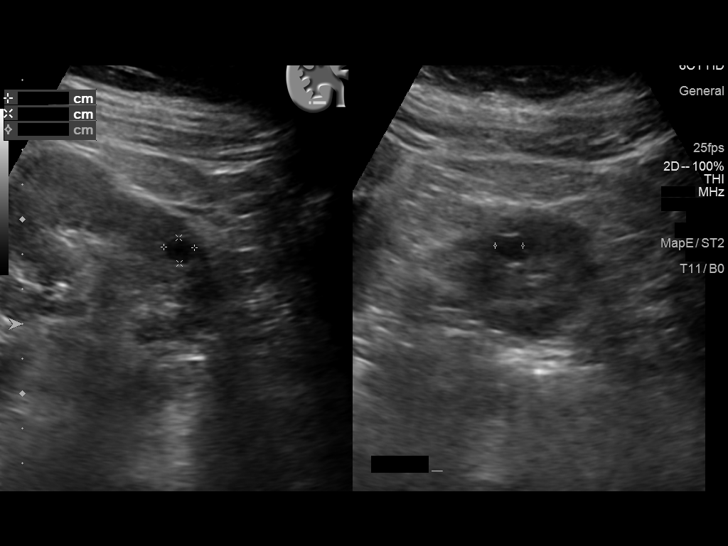
[im 28/46]
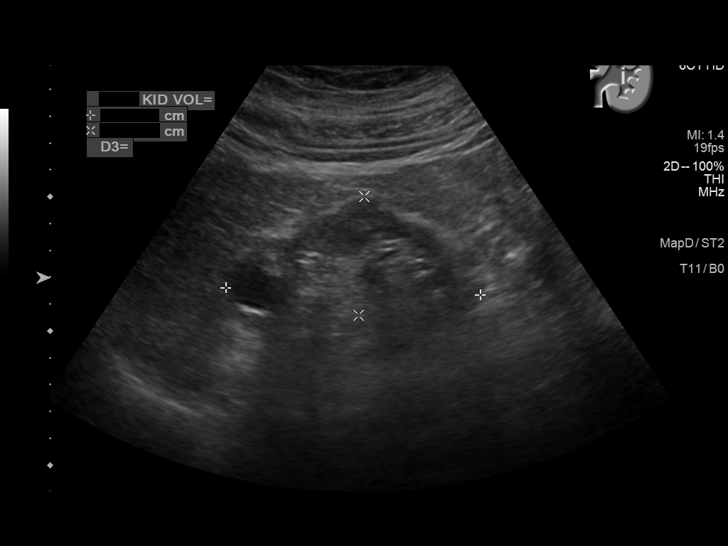
[im 32/46]
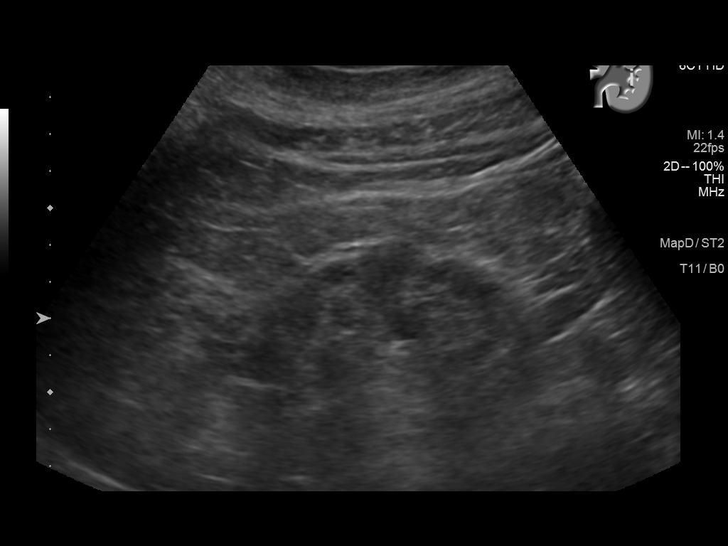
[im 36/46]
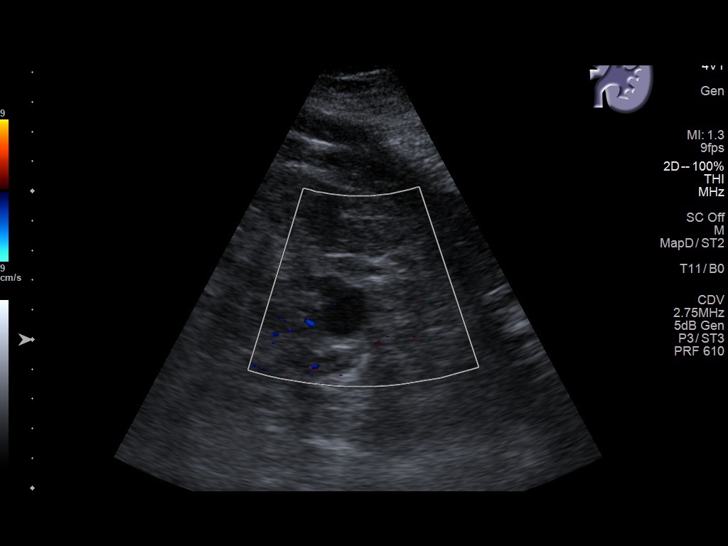
[im 40/46]
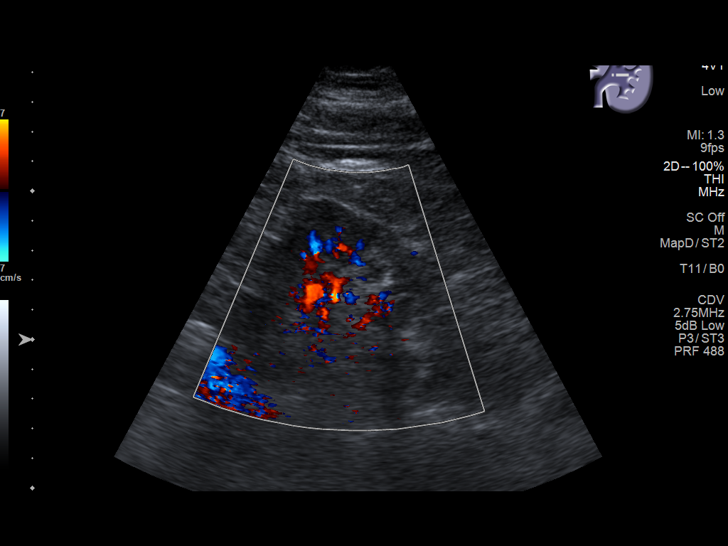
[im 44/46]
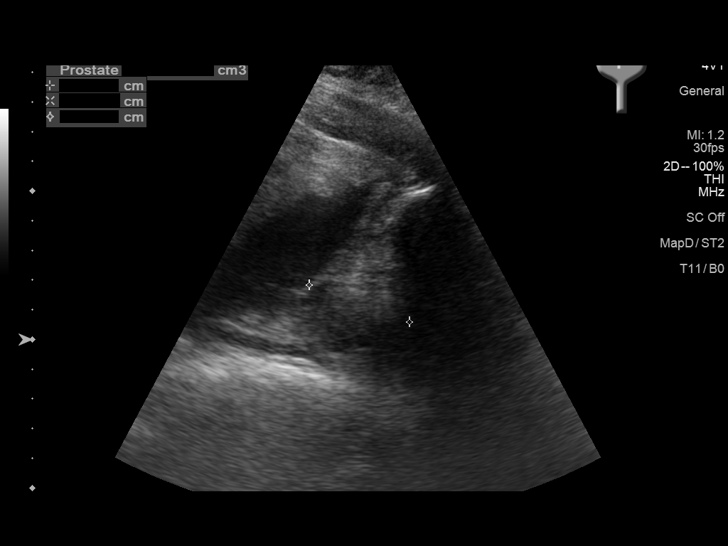

[Series 2001: us renal · 0.20mm/px · 1 of 1 slices shown (2 of 2)]
[im 1/1]
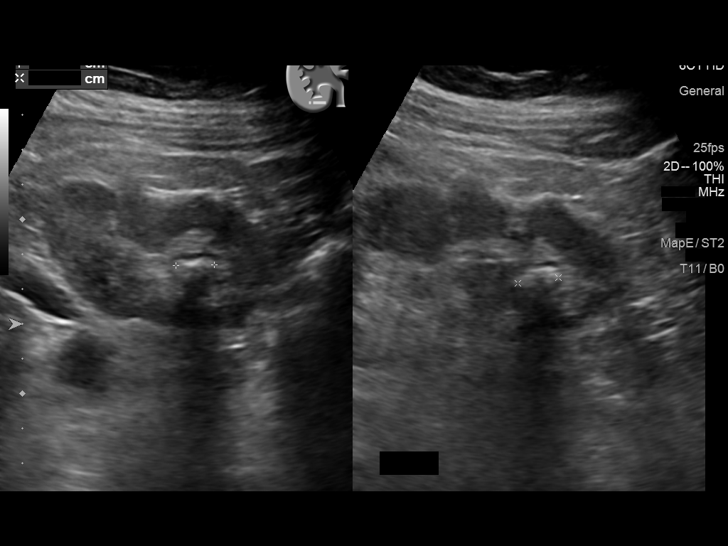

[13 of 25 positions shown; findings below may reference images not displayed]

FINDINGS: Right Kidney:

Renal measurements: 11.1 x 4.7 x 5.8 cm = volume: 160 mL. Cortical
echogenicity is within normal limits. There is a 12 mm, echogenic,
shadowing calculus in the lower pole right kidney. Additionally, in
the adjacent lower pole is a 0.9 x 0.8 x 0.7 cm anechoic cyst. No
concerning renal mass or hydronephrosis.

Left Kidney:

Renal measurements: 9.5 x 4.4 x 3.6 cm = volume: 80 mL. Cortical
echogenicity is within normal limits. Anechoic 2.0 x 1.8 x 1.6 cm
cyst in the upper pole left kidney. No shadowing calculus,
urolithiasis or hydronephrosis.

Bladder:

Poorly distended at the time of examination. No gross bladder
abnormality is seen aside from indentation by a borderline enlarged
prostate.

Other:

Prostate measures approximately 4.1 x 4.4 x 3.6 cm for an estimated
volume of 34 mL.
IMPRESSION: 1. No hydronephrosis.
2. 12 mm nonobstructing calculus in the lower pole right kidney.
3. Bilateral simple appearing renal cysts.
4. Poorly distended urinary bladder without gross abnormality.
5. Mild prostatomegaly.

## 2022-05-22 ENCOUNTER — Ambulatory Visit (INDEPENDENT_AMBULATORY_CARE_PROVIDER_SITE_OTHER): Payer: Medicare Other | Admitting: Unknown Physician Specialty

## 2022-05-22 ENCOUNTER — Ambulatory Visit
Admission: RE | Admit: 2022-05-22 | Discharge: 2022-05-22 | Disposition: A | Discharge status: Home / Self Care | Payer: Medicare Other | Attending: Unknown Physician Specialty | Admitting: Unknown Physician Specialty

## 2022-05-22 ENCOUNTER — Encounter: Payer: Self-pay | Admitting: Unknown Physician Specialty

## 2022-05-22 ENCOUNTER — Ambulatory Visit
Admission: RE | Admit: 2022-05-22 | Discharge: 2022-05-22 | Disposition: A | Discharge status: Home / Self Care | Payer: Medicare Other | Source: Ambulatory Visit | Attending: Unknown Physician Specialty | Admitting: Unknown Physician Specialty

## 2022-05-22 VITALS — BP 131/74 | HR 76 | Temp 98.6°F | Ht 66.14 in | Wt 209.8 lb

## 2022-05-22 DIAGNOSIS — M961 Postlaminectomy syndrome, not elsewhere classified: Secondary | ICD-10-CM

## 2022-05-22 DIAGNOSIS — J3489 Other specified disorders of nose and nasal sinuses: Secondary | ICD-10-CM

## 2022-05-22 DIAGNOSIS — N1832 Chronic kidney disease, stage 3b: Secondary | ICD-10-CM | POA: Diagnosis not present

## 2022-05-22 DIAGNOSIS — R052 Subacute cough: Secondary | ICD-10-CM | POA: Diagnosis not present

## 2022-05-22 DIAGNOSIS — R059 Cough, unspecified: Secondary | ICD-10-CM | POA: Diagnosis not present

## 2022-05-22 DIAGNOSIS — E1169 Type 2 diabetes mellitus with other specified complication: Secondary | ICD-10-CM

## 2022-05-22 DIAGNOSIS — E785 Hyperlipidemia, unspecified: Secondary | ICD-10-CM

## 2022-05-22 DIAGNOSIS — E1140 Type 2 diabetes mellitus with diabetic neuropathy, unspecified: Secondary | ICD-10-CM

## 2022-05-22 DIAGNOSIS — I152 Hypertension secondary to endocrine disorders: Secondary | ICD-10-CM

## 2022-05-22 DIAGNOSIS — E1159 Type 2 diabetes mellitus with other circulatory complications: Secondary | ICD-10-CM | POA: Diagnosis not present

## 2022-05-22 NOTE — Assessment & Plan Note (Signed)
LDL is 82

## 2022-06-08 DIAGNOSIS — D136 Benign neoplasm of pancreas: Secondary | ICD-10-CM | POA: Diagnosis not present

## 2022-06-08 DIAGNOSIS — K862 Cyst of pancreas: Secondary | ICD-10-CM | POA: Diagnosis not present

## 2022-06-08 DIAGNOSIS — R978 Other abnormal tumor markers: Secondary | ICD-10-CM | POA: Diagnosis not present

## 2022-07-19 ENCOUNTER — Other Ambulatory Visit: Payer: Self-pay | Admitting: Internal Medicine

## 2022-07-19 DIAGNOSIS — N400 Enlarged prostate without lower urinary tract symptoms: Secondary | ICD-10-CM

## 2022-07-19 MED ORDER — TAMSULOSIN HCL 0.4 MG PO CAPS: 0.4000 mg | ORAL_CAPSULE | Freq: Every day | ORAL | 4 refills | Status: DC

## 2022-07-19 NOTE — Telephone Encounter (Signed)
Requested medication (s) are due for refill today - yes  Requested medication (s) are on the active medication list -yes  Future visit scheduled -yes  Last refill: 12/12/21 #90  Notes to clinic: Dr Neomia Dear patient   Requested Prescriptions  Pending Prescriptions Disp Refills   tamsulosin (FLOMAX) 0.4 MG CAPS capsule 90 capsule 0    Sig: Take 1 capsule (0.4 mg total) by mouth daily.     Urology: Alpha-Adrenergic Blocker Passed - 07/19/2022  9:18 AM      Passed - PSA in normal range and within 360 days    Prostate Specific Ag, Serum  Date Value Ref Range Status  11/14/2021 0.3 0.0 - 4.0 ng/mL Final    Comment:    Roche ECLIA methodology. According to the American Urological Association, Serum PSA should decrease and remain at undetectable levels after radical prostatectomy. The AUA defines biochemical recurrence as an initial PSA value 0.2 ng/mL or greater followed by a subsequent confirmatory PSA value 0.2 ng/mL or greater. Values obtained with different assay methods or kits cannot be used interchangeably. Results cannot be interpreted as absolute evidence of the presence or absence of malignant disease.          Passed - Last BP in normal range    BP Readings from Last 1 Encounters:  05/22/22 131/74         Passed - Valid encounter within last 12 months    Recent Outpatient Visits           1 month ago Subacute cough   [facility] Kathrine Haddock, NP   8 months ago Need for influenza vaccination   [facility] Vigg, Avanti, MD   1 year ago Hyperlipidemia associated with type 2 diabetes mellitus ([place])   [facility] Vigg, Avanti, MD   1 year 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   [place] Vigg, Avanti, MD   1 year ago Hypertension associated with diabetes ([place])   [place], Scheryl Darter, NP       Future Appointments             In 4 months Ralene Bathe, MD [employer]   In 4 months [place],  Barbaraann Faster, NP [facility]               Requested Prescriptions  Pending Prescriptions Disp Refills   tamsulosin (FLOMAX) 0.4 MG CAPS capsule 90 capsule 0    Sig: Take 1 capsule (0.4 mg total) by mouth daily.     Urology: Alpha-Adrenergic Blocker Passed - 07/19/2022  9:18 AM      Passed - PSA in normal range and within 360 days    Prostate Specific Ag, Serum  Date Value Ref Range Status  11/14/2021 0.3 0.0 - 4.0 ng/mL Final    Comment:    Roche ECLIA methodology. According to the American Urological Association, Serum PSA should decrease and remain at undetectable levels after radical prostatectomy. The AUA defines biochemical recurrence as an initial PSA value 0.2 ng/mL or greater followed by a subsequent confirmatory PSA value 0.2 ng/mL or greater. Values obtained with different assay methods or kits cannot be used interchangeably. Results cannot be interpreted as absolute evidence of the presence or absence of malignant disease.          Passed - Last BP in normal range    BP Readings from Last 1 Encounters:  05/22/22 131/74         Passed -  Valid encounter within last 12 months    Recent Outpatient Visits           1 month ago Subacute cough   [facility] Kathrine Haddock, NP   8 months ago Need for influenza vaccination   [facility] Vigg, Avanti, MD   1 year ago Hyperlipidemia associated with type 2 diabetes mellitus ([place])   [facility] Vigg, Avanti, MD   1 year 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   [place] Vigg, Avanti, MD   1 year ago Hypertension associated with diabetes [facility])   [place], Scheryl Darter, NP       Future Appointments             In 4 months Ralene Bathe, MD [place]   In 4 months [place], Barbaraann Faster, NP [facility], [facility]

## 2022-07-19 NOTE — Telephone Encounter (Signed)
Medication Refill - Medication:  tamsulosin (FLOMAX) 0.4 MG CAPS capsule  Has the patient contacted their pharmacy? Yes.   (Agent: If no, request that the patient contact the pharmacy for the refill. If patient does not wish to contact the pharmacy document the reason why and proceed with request.) (Agent: If yes, when and what did the pharmacy advise?) Waiting on pcp approval   Preferred Pharmacy (with phone number or street name): [facility] [address]  Has the patient been seen for an appointment in the last year OR does the patient have an upcoming appointment? Yes.    Agent: Please be advised that RX refills may take up to 3 business days. We ask that you follow-up with your pharmacy.

## 2022-08-21 ENCOUNTER — Ambulatory Visit
Admission: RE | Admit: 2022-08-21 | Discharge: 2022-08-21 | Disposition: A | Discharge status: Home / Self Care | Payer: Medicare Other | Source: Ambulatory Visit | Attending: Urology | Admitting: Urology

## 2022-08-21 ENCOUNTER — Encounter: Payer: Self-pay | Admitting: Urology

## 2022-08-21 ENCOUNTER — Ambulatory Visit (INDEPENDENT_AMBULATORY_CARE_PROVIDER_SITE_OTHER): Payer: Medicare Other | Admitting: Urology

## 2022-08-21 ENCOUNTER — Ambulatory Visit
Admission: RE | Admit: 2022-08-21 | Discharge: 2022-08-21 | Disposition: A | Discharge status: Home / Self Care | Payer: Medicare Other | Attending: Urology | Admitting: Urology

## 2022-08-21 ENCOUNTER — Other Ambulatory Visit: Payer: Self-pay | Admitting: *Deleted

## 2022-08-21 VITALS — BP 147/73 | HR 83 | Ht 66.0 in | Wt 211.0 lb

## 2022-08-21 DIAGNOSIS — N200 Calculus of kidney: Secondary | ICD-10-CM

## 2022-08-21 DIAGNOSIS — N529 Male erectile dysfunction, unspecified: Secondary | ICD-10-CM | POA: Diagnosis not present

## 2022-08-21 DIAGNOSIS — M47816 Spondylosis without myelopathy or radiculopathy, lumbar region: Secondary | ICD-10-CM | POA: Diagnosis not present

## 2022-08-21 DIAGNOSIS — N401 Enlarged prostate with lower urinary tract symptoms: Secondary | ICD-10-CM | POA: Diagnosis not present

## 2022-08-21 DIAGNOSIS — N138 Other obstructive and reflux uropathy: Secondary | ICD-10-CM

## 2022-08-21 NOTE — Patient Instructions (Signed)
Bladder symptoms: Tea, soda, diet drinks are commonly associated with overactive bladder symptoms of urgency and frequency, anticipate your urinary symptoms will improve by cutting back on some of these fluids.  There are some medications for overactive bladder, but these can have side effects so we try to avoid these unless worsening symptoms  Erectile Dysfunction Erectile dysfunction (ED) is the inability to get or keep an erection in order to have sexual intercourse. ED is considered a symptom of an underlying disorder and is not considered a disease. ED may include: Inability to get an erection. Lack of enough hardness of the erection to allow penetration. Loss of erection before sex is finished. What are the causes? This condition may be caused by: Physical causes, such as: Artery problems. This may include heart disease, high blood pressure, atherosclerosis, and diabetes. Hormonal problems, such as low testosterone. Obesity. Nerve problems. This may include back or pelvic injuries, multiple sclerosis, Parkinson's disease, spinal cord injury, and stroke. Certain medicines, such as: Pain relievers. Antidepressants. Blood pressure medicines and water pills (diuretics). Cancer medicines. Antihistamines. Muscle relaxants. Lifestyle factors, such as: Use of drugs such as marijuana, cocaine, or opioids. Excessive use of alcohol. Smoking. Lack of physical activity or exercise. Psychological causes, such as: Anxiety or stress. Sadness or depression. Exhaustion. Fear about sexual performance. Guilt. What are the signs or symptoms? Symptoms of this condition include: Inability to get an erection. Lack of enough hardness of the erection to allow penetration. Loss of the erection before sex is finished. Sometimes having normal erections, but with frequent unsatisfactory episodes. Low sexual satisfaction in either partner due to erection problems. A curved penis occurring with  erection. The curve may cause pain, or the penis may be too curved to allow for intercourse. Never having nighttime or morning erections. How is this diagnosed? This condition is often diagnosed by: Performing a physical exam to find other diseases or specific problems with the penis. Asking you detailed questions about the problem. Doing tests, such as: Blood tests to check for diabetes mellitus or high cholesterol, or to measure hormone levels. Other tests to check for underlying health conditions. An ultrasound exam to check for scarring. A test to check blood flow to the penis. Doing a sleep study at home to measure nighttime erections. How is this treated? This condition may be treated by: Medicines, such as: Medicine taken by mouth to help you achieve an erection (oral medicine). Medicine that is injected into the penis. Your health care provider may instruct you how to give yourself these injections at home. Medicine that is delivered with a short applicator tube. The tube is inserted into the opening at the tip of the penis, which is the opening of the urethra. A tiny pellet of medicine is put in the urethra. The pellet dissolves and enhances erectile function. This is also called MUSE (medicated urethral system for erections) therapy. Vacuum pump. This is a pump with a ring on it. The pump and ring are placed on the penis and used to create pressure that helps the penis become erect. Penile implant surgery. In this procedure, you may receive: An inflatable implant. This consists of cylinders, a pump, and a reservoir. The cylinders can be inflated with a fluid that helps to create an erection, and they can be deflated after intercourse. A semi-rigid implant. This consists of two silicone rubber rods. The rods provide some rigidity. They are also flexible, so the penis can both curve downward in its normal position and  become straight for sexual intercourse. Blood vessel surgery to  improve blood flow to the penis. During this procedure, a blood vessel from a different part of the body is placed into the penis to allow blood to flow around (bypass) damaged or blocked blood vessels. Lifestyle changes, such as exercising more, losing weight, and quitting smoking. Follow these instructions at home: Medicines  Take over-the-counter and prescription medicines only as told by your health care provider. Do not increase the dosage without first discussing it with your health care provider. If you are using self-injections, do injections as directed by your health care provider. Make sure you avoid any veins that are on the surface of the penis. After giving an injection, apply pressure to the injection site for 5 minutes. Talk to your health care provider about how to prevent headaches while taking ED medicines. These medicines may cause a sudden headache due to the increase in blood flow in your body. General instructions Exercise regularly, as directed by your health care provider. Work with your health care provider to lose weight, if needed. Do not use any products that contain nicotine or tobacco. These products include cigarettes, chewing tobacco, and vaping devices, such as e-cigarettes. If you need help quitting, ask your health care provider. Before using a vacuum pump, read the instructions that come with the pump and discuss any questions with your health care provider. Keep all follow-up visits. This is important. Contact a health care provider if: You feel nauseous. You are vomiting. You get sudden headaches while taking ED medicines. You have any concerns about your sexual health. Get help right away if: You are taking oral or injectable medicines and you have an erection that lasts longer than 4 hours. If your health care provider is unavailable, go to the nearest emergency room for evaluation. An erection that lasts much longer than 4 hours can result in permanent  damage to your penis. You have severe pain in your groin or abdomen. You develop redness or severe swelling of your penis. You have redness spreading at your groin or lower abdomen. You are unable to urinate. You experience chest pain or a rapid heartbeat (palpitations) after taking oral medicines. These symptoms may represent a serious problem that is an emergency. Do not wait to see if the symptoms will go away. Get medical help right away. Call your local emergency services (911 in the [place] Do not drive yourself to the hospital. Summary Erectile dysfunction (ED) is the inability to get or keep an erection during sexual intercourse. This condition is diagnosed based on a physical exam, your symptoms, and tests to determine the cause. Treatment varies depending on the cause and may include medicines, hormone therapy, surgery, or a vacuum pump. You may need follow-up visits to make sure that you are using your medicines or devices correctly. Get help right away if you are taking or injecting medicines and you have an erection that lasts longer than 4 hours. This information is not intended to replace advice given to you by your health care provider. Make sure you discuss any questions you have with your health care provider. Document Revised: 02/08/2021 Document Reviewed: 02/08/2021 Elsevier Patient Education  [place] of a Plant-Based Diet for Urology Health  A plant-based diet emphasizes the consumption of whole, unprocessed plant foods while minimizing or excluding animal products including meat and dairy products. This dietary approach has gained attention for its potential to promote overall health, including urology-related conditions.  Incorporating a plant-based diet into your lifestyle can offer numerous benefits for maintaining optimal urology health.  1. Reduced Risk of Kidney Stones: A plant-based diet is typically rich in fruits, vegetables, legumes, and  whole grains. These foods are high in dietary fiber, potassium, and magnesium, which can help reduce the risk of developing kidney stones. Be careful to avoid high quantities of spinach, as these can contribute to kidney stone formation if eaten in large volumes. The increased intake of water-soluble fiber can enhance the excretion of waste products and prevent the crystallization of minerals that lead to stone formation.  2. Improved Prostate Health: Studies have suggested a link between the consumption of red and processed meats and an increased risk of prostate problems, including benign prostatic hyperplasia (BPH) and prostate cancer. By adopting a plant-based diet, you can lower your intake of saturated fats and decrease the risk of these conditions. PSA levels can often decrease on plant based diets! Plant foods are also rich in antioxidants and phytochemicals that have been associated with prostate health.  3. Better Bladder Function: A diet focused on plant-based foods can contribute to better bladder health by reducing the risk of urinary tract infections (UTIs). Berries, citrus fruits, and leafy greens are known for their high vitamin C content, which can acidify urine and create an environment less favorable for bacteria growth. Additionally, plant-based diets are generally lower in sodium, which can help prevent fluid retention and reduce the strain on the bladder.  4. Management of Erectile Dysfunction (ED): Some research suggests that a plant-based diet can positively impact erectile function. Plant-based diets are associated with improved cardiovascular health, which is crucial for maintaining healthy blood flow and nerve function required for proper erectile function. By reducing the consumption of high-cholesterol and high-saturated fat animal products, a plant-based diet may contribute to a decreased risk of ED.  5. Prevention of Chronic Conditions: A plant-based diet can help prevent  or manage chronic conditions such as obesity, diabetes, and hypertension. These conditions can contribute to urology-related issues, including urinary incontinence and kidney dysfunction. By maintaining a healthy weight and managing these conditions, you can reduce the risk of urology-related complications.  Conclusion: Embracing a plant-based diet can offer significant benefits for urology health. By incorporating a variety of colorful fruits, vegetables, whole grains, nuts, seeds, and legumes into your meals, you can support kidney health, prostate health, bladder function, and overall well-being. Remember to consult with a healthcare professional or registered dietitian before making any significant dietary changes, especially if you have existing health conditions. Your personalized approach to a plant-based diet can contribute to improved urology health and enhance your quality of life.

## 2022-08-21 NOTE — Progress Notes (Signed)
   08/21/2022 10:34 AM   [name] 1951-05-20 [id number]  Reason for visit: Follow up nephrolithiasis, ED, BPH  HPI: 71 year old male followed for the above issues.  He has an atrophic left kidney, and known right nonobstructing renal stones that have been present at least 10 years.  He previously underwent ureteroscopy in the distant past with Dr. Bernardo Heater.  He denies any stone problems or gross hematuria over the last year.  He thinks he may have had uric acid stones in the past, but he could not tolerate potassiums citrate secondary to GI upset.  We discussed considering ureteroscopy and laser lithotripsy for his stones and essentially solitary right kidney, but he defers at this time.  He understands the risks of obstruction on the right with an atrophic left kidney, and return precautions discussed extensively.  I personally viewed and interpreted the KUB today that shows a stable possible 1 cm right lower pole stone, scattered left-sided nephrolithiasis.  Regarding his urinary symptoms, these have been stable on maximal medical therapy with Flomax and finasteride.  These are prescribed by his PCP.  Recommend PVR at follow-up.  In terms of his ED, he has previously been tried on max dose sildenafil and Cialis without improvement.  We reviewed other strategies including vacuum erection device or penile injections.  He would like to start with a vacuum erection device, but will call if he decides to pursue penile injections.   Continue maximal medical therapy for BPH RTC 1 year KUB and PVR, sooner if problems    Billey Co, MD  [facility] [address] [place], [place] [address] [phone]

## 2022-09-24 DIAGNOSIS — D2272 Melanocytic nevi of left lower limb, including hip: Secondary | ICD-10-CM | POA: Diagnosis not present

## 2022-09-24 DIAGNOSIS — D225 Melanocytic nevi of trunk: Secondary | ICD-10-CM | POA: Diagnosis not present

## 2022-09-24 DIAGNOSIS — L821 Other seborrheic keratosis: Secondary | ICD-10-CM | POA: Diagnosis not present

## 2022-09-24 DIAGNOSIS — D2261 Melanocytic nevi of right upper limb, including shoulder: Secondary | ICD-10-CM | POA: Diagnosis not present

## 2022-09-24 DIAGNOSIS — C44319 Basal cell carcinoma of skin of other parts of face: Secondary | ICD-10-CM | POA: Diagnosis not present

## 2022-09-24 DIAGNOSIS — I788 Other diseases of capillaries: Secondary | ICD-10-CM | POA: Diagnosis not present

## 2022-09-24 DIAGNOSIS — D2271 Melanocytic nevi of right lower limb, including hip: Secondary | ICD-10-CM | POA: Diagnosis not present

## 2022-09-24 DIAGNOSIS — D485 Neoplasm of uncertain behavior of skin: Secondary | ICD-10-CM | POA: Diagnosis not present

## 2022-09-24 DIAGNOSIS — D2262 Melanocytic nevi of left upper limb, including shoulder: Secondary | ICD-10-CM | POA: Diagnosis not present

## 2022-09-24 DIAGNOSIS — R208 Other disturbances of skin sensation: Secondary | ICD-10-CM | POA: Diagnosis not present

## 2022-09-24 DIAGNOSIS — L538 Other specified erythematous conditions: Secondary | ICD-10-CM | POA: Diagnosis not present

## 2022-09-24 DIAGNOSIS — L570 Actinic keratosis: Secondary | ICD-10-CM | POA: Diagnosis not present

## 2022-09-24 DIAGNOSIS — L820 Inflamed seborrheic keratosis: Secondary | ICD-10-CM | POA: Diagnosis not present

## 2022-09-27 DIAGNOSIS — C44319 Basal cell carcinoma of skin of other parts of face: Secondary | ICD-10-CM | POA: Diagnosis not present

## 2022-10-25 ENCOUNTER — Other Ambulatory Visit: Payer: Self-pay

## 2022-10-25 DIAGNOSIS — E1121 Type 2 diabetes mellitus with diabetic nephropathy: Secondary | ICD-10-CM

## 2022-10-25 NOTE — Telephone Encounter (Signed)
Medication refill for glimepiride '2mg'$  last ov 05/22/22, upcoming ov 11/29/22 . Please advise

## 2022-10-26 MED ORDER — GLIMEPIRIDE 2 MG PO TABS: ORAL_TABLET | ORAL | 0 refills | Status: DC

## 2022-11-05 ENCOUNTER — Other Ambulatory Visit: Payer: Self-pay

## 2022-11-05 DIAGNOSIS — N400 Enlarged prostate without lower urinary tract symptoms: Secondary | ICD-10-CM

## 2022-11-05 MED ORDER — FINASTERIDE 5 MG PO TABS: 5.0000 mg | ORAL_TABLET | Freq: Every day | ORAL | 1 refills | Status: DC

## 2022-11-05 NOTE — Telephone Encounter (Signed)
Medication refill for Finasteride '5mg'$  last ov 05/22/22, upcoming ov 11/29/22 . Please advise

## 2022-11-06 DIAGNOSIS — L538 Other specified erythematous conditions: Secondary | ICD-10-CM | POA: Diagnosis not present

## 2022-11-06 DIAGNOSIS — L570 Actinic keratosis: Secondary | ICD-10-CM | POA: Diagnosis not present

## 2022-11-06 DIAGNOSIS — L820 Inflamed seborrheic keratosis: Secondary | ICD-10-CM | POA: Diagnosis not present

## 2022-11-07 ENCOUNTER — Other Ambulatory Visit: Payer: Self-pay

## 2022-11-07 DIAGNOSIS — I152 Hypertension secondary to endocrine disorders: Secondary | ICD-10-CM

## 2022-11-07 DIAGNOSIS — N1831 Chronic kidney disease, stage 3a: Secondary | ICD-10-CM

## 2022-11-07 MED ORDER — LISINOPRIL 10 MG PO TABS: 10.0000 mg | ORAL_TABLET | Freq: Every day | ORAL | 4 refills | Status: DC

## 2022-11-07 NOTE — Telephone Encounter (Signed)
Medication refill for lisinopril last ov 05/22/22, upcoming ov 11/29/22 . Please advise

## 2022-11-26 DIAGNOSIS — N2581 Secondary hyperparathyroidism of renal origin: Secondary | ICD-10-CM | POA: Insufficient documentation

## 2022-11-26 NOTE — Patient Instructions (Signed)
Diabetes Mellitus Basics  Diabetes mellitus, or diabetes, is a long-term (chronic) disease. It occurs when the body does not properly use sugar (glucose) that is released from food after you eat. Diabetes mellitus may be caused by one or both of these problems: Your pancreas does not make enough of a hormone called insulin. Your body does not react in a normal way to the insulin that it makes. Insulin lets glucose enter cells in your body. This gives you energy. If you have diabetes, glucose cannot get into cells. This causes high blood glucose (hyperglycemia). How to treat and manage diabetes You may need to take insulin or other diabetes medicines daily to keep your glucose in balance. If you are prescribed insulin, you will learn how to give yourself insulin by injection. You may need to adjust the amount of insulin you take based on the foods that you eat. You will need to check your blood glucose levels using a glucose monitor as told by your health care provider. The readings can help determine if you have low or high blood glucose. Generally, you should have these blood glucose levels: Before meals (preprandial): 80-130 mg/dL (4.4-7.2 mmol/L). After meals (postprandial): below 180 mg/dL (10 mmol/L). Hemoglobin A1c (HbA1c) level: less than 7%. Your health care provider will set treatment goals for you. Keep all follow-up visits. This is important. Follow these instructions at home: Diabetes medicines Take your diabetes medicines every day as told by your health care provider. List your diabetes medicines here: Name of medicine: ______________________________ Amount (dose): _______________ Time (a.m./p.m.): _______________ Notes: ___________________________________ Name of medicine: ______________________________ Amount (dose): _______________ Time (a.m./p.m.): _______________ Notes: ___________________________________ Name of medicine: ______________________________ Amount (dose):  _______________ Time (a.m./p.m.): _______________ Notes: ___________________________________ Insulin If you use insulin, list the types of insulin you use here: Insulin type: ______________________________ Amount (dose): _______________ Time (a.m./p.m.): _______________Notes: ___________________________________ Insulin type: ______________________________ Amount (dose): _______________ Time (a.m./p.m.): _______________ Notes: ___________________________________ Insulin type: ______________________________ Amount (dose): _______________ Time (a.m./p.m.): _______________ Notes: ___________________________________ Insulin type: ______________________________ Amount (dose): _______________ Time (a.m./p.m.): _______________ Notes: ___________________________________ Insulin type: ______________________________ Amount (dose): _______________ Time (a.m./p.m.): _______________ Notes: ___________________________________ Managing blood glucose  Check your blood glucose levels using a glucose monitor as told by your health care provider. Write down the times that you check your glucose levels here: Time: _______________ Notes: ___________________________________ Time: _______________ Notes: ___________________________________ Time: _______________ Notes: ___________________________________ Time: _______________ Notes: ___________________________________ Time: _______________ Notes: ___________________________________ Time: _______________ Notes: ___________________________________  Low blood glucose Low blood glucose (hypoglycemia) is when glucose is at or below 70 mg/dL (3.9 mmol/L). Symptoms may include: Feeling: Hungry. Sweaty and clammy. Irritable or easily upset. Dizzy. Sleepy. Having: A fast heartbeat. A headache. A change in your vision. Numbness around the mouth, lips, or tongue. Having trouble with: Moving (coordination). Sleeping. Treating low blood glucose To treat low blood  glucose, eat or drink something containing sugar right away. If you can think clearly and swallow safely, follow the 15:15 rule: Take 15 grams of a fast-acting carb (carbohydrate), as told by your health care provider. Some fast-acting carbs are: Glucose tablets: take 3-4 tablets. Hard candy: eat 3-5 pieces. Fruit juice: drink 4 oz (120 mL). Regular (not diet) soda: drink 4-6 oz (120-180 mL). Honey or sugar: eat 1 Tbsp (15 mL). Check your blood glucose levels 15 minutes after you take the carb. If your glucose is still at or below 70 mg/dL (3.9 mmol/L), take 15 grams of a carb again. If your glucose does not go above 70 mg/dL (3.9 mmol/L) after   3 tries, get help right away. After your glucose goes back to normal, eat a meal or a snack within 1 hour. Treating very low blood glucose If your glucose is at or below 54 mg/dL (3 mmol/L), you have very low blood glucose (severe hypoglycemia). This is an emergency. Do not wait to see if the symptoms will go away. Get medical help right away. Call your local emergency services (911 in the [place] Do not drive yourself to the hospital. Questions to ask your health care provider Should I talk with a diabetes educator? What equipment will I need to care for myself at home? What diabetes medicines do I need? When should I take them? How often do I need to check my blood glucose levels? What number can I call if I have questions? When is my follow-up visit? Where can I find a support group for people with diabetes? Where to find more information American Diabetes Association: www.diabetes.org Association of Diabetes Care and Education Specialists: www.diabeteseducator.org Contact a health care provider if: Your blood glucose is at or above 240 mg/dL (13.3 mmol/L) for 2 days in a row. You have been sick or have had a fever for 2 days or more, and you are not getting better. You have any of these problems for more than 6 hours: You cannot eat or  drink. You feel nauseous. You vomit. You have diarrhea. Get help right away if: Your blood glucose is lower than 54 mg/dL (3 mmol/L). You get confused. You have trouble thinking clearly. You have trouble breathing. These symptoms may represent a serious problem that is an emergency. Do not wait to see if the symptoms will go away. Get medical help right away. Call your local emergency services (911 in the [place] Do not drive yourself to the hospital. Summary Diabetes mellitus is a chronic disease that occurs when the body does not properly use sugar (glucose) that is released from food after you eat. Take insulin and diabetes medicines as told. Check your blood glucose every day, as often as told. Keep all follow-up visits. This is important. This information is not intended to replace advice given to you by your health care provider. Make sure you discuss any questions you have with your health care provider. Document Revised: 03/15/2020 Document Reviewed: 03/15/2020 Elsevier Patient Education  2023 Elsevier Inc.  

## 2022-11-28 ENCOUNTER — Ambulatory Visit: Payer: Medicare Other | Admitting: Dermatology

## 2022-11-29 ENCOUNTER — Encounter: Payer: Self-pay | Admitting: Nurse Practitioner

## 2022-11-29 ENCOUNTER — Ambulatory Visit (INDEPENDENT_AMBULATORY_CARE_PROVIDER_SITE_OTHER): Payer: Medicare Other | Admitting: Nurse Practitioner

## 2022-11-29 VITALS — BP 111/70 | HR 75 | Temp 98.3°F | Ht 66.14 in | Wt 206.5 lb

## 2022-11-29 DIAGNOSIS — N2581 Secondary hyperparathyroidism of renal origin: Secondary | ICD-10-CM

## 2022-11-29 DIAGNOSIS — E1122 Type 2 diabetes mellitus with diabetic chronic kidney disease: Secondary | ICD-10-CM

## 2022-11-29 DIAGNOSIS — E1159 Type 2 diabetes mellitus with other circulatory complications: Secondary | ICD-10-CM

## 2022-11-29 DIAGNOSIS — Z23 Encounter for immunization: Secondary | ICD-10-CM | POA: Diagnosis not present

## 2022-11-29 DIAGNOSIS — Z0000 Encounter for general adult medical examination without abnormal findings: Secondary | ICD-10-CM

## 2022-11-29 DIAGNOSIS — Z6836 Body mass index (BMI) 36.0-36.9, adult: Secondary | ICD-10-CM

## 2022-11-29 DIAGNOSIS — N401 Enlarged prostate with lower urinary tract symptoms: Secondary | ICD-10-CM

## 2022-11-29 DIAGNOSIS — Z6833 Body mass index (BMI) 33.0-33.9, adult: Secondary | ICD-10-CM

## 2022-11-29 DIAGNOSIS — G4733 Obstructive sleep apnea (adult) (pediatric): Secondary | ICD-10-CM

## 2022-11-29 DIAGNOSIS — N1832 Chronic kidney disease, stage 3b: Secondary | ICD-10-CM

## 2022-11-29 DIAGNOSIS — Z7984 Long term (current) use of oral hypoglycemic drugs: Secondary | ICD-10-CM

## 2022-11-29 DIAGNOSIS — K219 Gastro-esophageal reflux disease without esophagitis: Secondary | ICD-10-CM

## 2022-11-29 DIAGNOSIS — N400 Enlarged prostate without lower urinary tract symptoms: Secondary | ICD-10-CM | POA: Diagnosis not present

## 2022-11-29 DIAGNOSIS — E6609 Other obesity due to excess calories: Secondary | ICD-10-CM

## 2022-11-29 DIAGNOSIS — E1140 Type 2 diabetes mellitus with diabetic neuropathy, unspecified: Secondary | ICD-10-CM | POA: Diagnosis not present

## 2022-11-29 DIAGNOSIS — E785 Hyperlipidemia, unspecified: Secondary | ICD-10-CM

## 2022-11-29 DIAGNOSIS — E1169 Type 2 diabetes mellitus with other specified complication: Secondary | ICD-10-CM

## 2022-11-29 DIAGNOSIS — I152 Hypertension secondary to endocrine disorders: Secondary | ICD-10-CM

## 2022-11-29 DIAGNOSIS — I359 Nonrheumatic aortic valve disorder, unspecified: Secondary | ICD-10-CM | POA: Diagnosis not present

## 2022-11-29 DIAGNOSIS — K862 Cyst of pancreas: Secondary | ICD-10-CM

## 2022-11-29 LAB — BAYER DCA HB A1C WAIVED: HB A1C (BAYER DCA - WAIVED): 7.1 % — ABNORMAL HIGH (ref 4.8–5.6)

## 2022-11-29 LAB — MICROALBUMIN, URINE WAIVED
Creatinine, Urine Waived: 50 mg/dL (ref 10–300)
Microalb, Ur Waived: 80 mg/L — ABNORMAL HIGH (ref 0–19)
Microalb/Creat Ratio: >300 mg/g — ABNORMAL HIGH (ref <30)

## 2022-11-29 MED ORDER — ATORVASTATIN CALCIUM 20 MG PO TABS: 20.0000 mg | ORAL_TABLET | Freq: Every day | ORAL | 4 refills | Status: DC

## 2022-11-29 MED ORDER — FINASTERIDE 5 MG PO TABS: 5.0000 mg | ORAL_TABLET | Freq: Every day | ORAL | 4 refills | Status: DC

## 2022-11-29 MED ORDER — OMEPRAZOLE 20 MG PO CPDR: DELAYED_RELEASE_CAPSULE | ORAL | 4 refills | Status: DC

## 2022-11-29 MED ORDER — TAMSULOSIN HCL 0.4 MG PO CAPS: 0.4000 mg | ORAL_CAPSULE | Freq: Every day | ORAL | 4 refills | Status: DC

## 2022-11-29 NOTE — Assessment & Plan Note (Signed)
Chronic, ongoing with A1c 7.1% today, trend up due to diet.  Continue current medication regimen and adjust as needed, discussed with him to closely monitor BS with this and notify provider if consistent <70.  He wishes to focus on diet and exercise.  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  Monitor use of Glimepiride due to age and if BS consistently <70 consider alternate options.  Check BS at home as least three mornings a week.  Return in 6 months.

## 2022-11-29 NOTE — Assessment & Plan Note (Signed)
Chronic, ongoing.  Followed by nephrology at [facility], recent notes and labs reviewed.

## 2022-11-29 NOTE — Assessment & Plan Note (Signed)
Chronic, stable with Prilosec use.  Continue current regimen and check Mag level annually. Risks of PPI use were discussed with patient including bone loss, C. Diff diarrhea, pneumonia, infections, CKD, electrolyte abnormalities.  Verbalizes understanding and chooses to continue the medication.

## 2022-11-29 NOTE — Assessment & Plan Note (Signed)
Chronic, ongoing.  Monitored by nephrology at [facility], continue collaboration and Lisinopril at this time.  Recent notes reviewed.  Labs today.

## 2022-11-29 NOTE — Assessment & Plan Note (Signed)
Chronic.  100% use of CPAP at home, continue adherent regimen.

## 2022-11-29 NOTE — Assessment & Plan Note (Addendum)
Chronic.  Monitored by [facility] providers, continue collaboration.  Reviewed recent notes.

## 2022-11-29 NOTE — Assessment & Plan Note (Signed)
Stable with no symptoms.  Continue to monitor and return to cardiology as needed.

## 2022-11-29 NOTE — Assessment & Plan Note (Signed)
Refer to obesity plan of care.

## 2022-11-29 NOTE — Assessment & Plan Note (Signed)
Chronic, ongoing.  Continue current medication regimen and adjust as needed. Lipid panel today. 

## 2022-11-29 NOTE — Assessment & Plan Note (Addendum)
Chronic, stable.  BP at goal in office and at home.  Continue current medication regimen and adjust as needed.  Recommend checking BP at least three times a week at home.  Focus on DASH diet.  LABS: CBC, CMP, TSH, urine ALB.  Return in 6 months.

## 2022-11-29 NOTE — Progress Notes (Addendum)
BP 111/70   Pulse 75   Temp 98.3 F (36.8 C) (Oral)   Ht 5' 6.14" (1.68 m)   Wt 206 lb 8 oz (93.7 kg)   SpO2 95%   BMI 33.19 kg/m    Subjective:    Patient ID: Thomas Huang, male    DOB: December 23, 1950, 72 y.o.   MRN: [id number]  HPI: Thomas KROTZER is a 72 y.o. male presenting on 11/29/2022 for Medicare Wellness Exam and follow-up examination. Current medical complaints include:none  He currently lives with: wife Interim Problems from his last visit: no  DIABETES A1c June 6.8%.  Continues on Glimepiride 2 MG BID -- he sometimes does take second dose dependent on sugars.  Has neuropathy in left foot, tried Gabapentin in past without benefit over 5 month period.  Currently taking Alpha Lipoic Acid, which has offered some benefit in that it is not worsening -- only taking 200 MG. Hypoglycemic episodes:no Polydipsia/polyuria: no Visual disturbance: no Chest pain: no Paresthesias: no Glucose Monitoring: yes             Accucheck frequency: 3-4 times a day             Fasting glucose: 80 to 130 range on average, a little higher over holidays             Post prandial:             Evening:             Before meals:  Taking Insulin?: no             Long acting insulin:             Short acting insulin: Blood Pressure Monitoring: daily Retinal Examination: Not Up to Date  Foot Exam: Up to Date Pneumovax: Up to Date Influenza: Up to Date Aspirin: no    HYPERTENSION / HYPERLIPIDEMIA Continues on Lisinopril and Atorvastatin (tried higher doses and different statins in past with joint pain, so is only taking 1/2 a pill = 10 MG).  Uses CPAP 100% at home for OSA.   Echo in 2017 with EF 60-65%, mild to moderately calcified aortic leaflets.  Saw cardiology in 2017 and has not returned, heart murmur is present.   Satisfied with current treatment? yes Duration of hypertension: chronic BP monitoring frequency: rarely BP range: < 130/80 on average BP medication side effects: no Duration  of hyperlipidemia: chronic Cholesterol medication side effects: no Cholesterol supplements: none Medication compliance: good compliance Aspirin: no Recent stressors: no Recurrent headaches: no Visual changes: no Palpitations: no Dyspnea: no Chest pain: no Lower extremity edema: no Dizzy/lightheaded: no    CHRONIC KIDNEY DISEASE Sees nephrology at [facility], last visit 05/02/22.  Goes once a year to see them.   CKD status: stable Medications renally dose: yes Previous renal evaluation: yes Pneumovax:  Up to Date Influenza Vaccine:  Up to Date   GERD Follows with GI, last visit 06/08/22.  Taking Prilosec. They follow for pancreas cyst, imaging done and plan to follow-up in one year.   GERD control status: stable  Satisfied with current treatment? yes Heartburn frequency: intermittent Medication side effects: no  Medication compliance: stable Previous GERD medications: Prilosec Antacid use frequency:  none Dysphagia: no Odynophagia:  no Hematemesis: no Blood in stool: no EGD: no  BPH Continues on Flomax 0.4 MG daily.  Follows with urology, last visit 08/21/22. BPH status: stable Satisfied with current treatment?: yes Medication side effects: no Medication compliance: good  compliance Duration: chronic Nocturia: no Urinary frequency:no Incomplete voiding: no Urgency: yes Weak urinary stream: yes Straining to start stream: no Dysuria: no  Functional Status Survey: Is the patient deaf or have difficulty hearing?: No Does the patient have difficulty seeing, even when wearing glasses/contacts?: No Does the patient have difficulty concentrating, remembering, or making decisions?: No Does the patient have difficulty walking or climbing stairs?: No Does the patient have difficulty dressing or bathing?: No Does the patient have difficulty doing errands alone such as visiting a doctor's office or shopping?: No  FALL RISK:    11/29/2022    8:29 AM 05/22/2022    8:55 AM 11/21/2021     9:01 AM 05/22/2021    9:49 AM 02/22/2021    9:41 AM  [place] in the past year? 0 0 0 0 0  Number falls in past yr: 0 0 0 0   Injury with Fall? 0 0 0 0   Risk for fall due to : No Fall Risks No Fall Risks No Fall Risks No Fall Risks   Follow up Education provided Falls evaluation completed Falls evaluation completed Falls evaluation completed        11/29/2022    8:17 AM 05/22/2022    8:56 AM 11/21/2021    9:01 AM 05/22/2021    9:50 AM 02/22/2021    9:41 AM  Depression screen PHQ 2/9  Decreased Interest 0 0 0 0 0  Down, Depressed, Hopeless 0 0 0 0 0  PHQ - 2 Score 0 0 0 0 0  Altered sleeping 0 0 0    Tired, decreased energy 0 0 0    Change in appetite 0 0 0    Feeling bad or failure about yourself  0 0 0    Trouble concentrating 0 0 0    Moving slowly or fidgety/restless 0 0 0    Suicidal thoughts 0 0 0    PHQ-9 Score 0 0 0    Difficult doing work/chores Not difficult at all Not difficult at all Not difficult at all         11/29/2022    8:17 AM 05/22/2022    8:56 AM 11/21/2021    9:02 AM  GAD 7 : Generalized Anxiety Score  Nervous, Anxious, on Edge 0 0 0  Control/stop worrying 0 0 0  Worry too much - different things 0 0 0  Trouble relaxing 0 0 0  Restless 0 0 0  Easily annoyed or irritable 0 0 0  Afraid - awful might happen 0 0 0  Total GAD 7 Score 0 0 0  Anxiety Difficulty Not difficult at all Not difficult at all Not difficult at all   Past Medical History:  Past Medical History:  Diagnosis Date   Arthritis    lower back   Carpal tunnel syndrome of left wrist    Chronic kidney disease    Diabetes mellitus, type 2 (HCC)    GERD (gastroesophageal reflux disease)    Heart murmur    followed by PCP   Hyperlipidemia    Sleep apnea    CPAP   Vertigo    mild. several episodes/wk    Surgical History:  Past Surgical History:  Procedure Laterality Date   CARPAL TUNNEL RELEASE     CLOSED REDUCTION NASAL FRACTURE N/A 11/05/2017   Procedure: CLOSED  REDUCTION NASAL FRACTURE WITH NASAL TURBINATE COBLATION;  Surgeon: Clyde Canterbury, MD;  Location: [facility];  Service: ENT;  Laterality: N/A;  Diabetic - oral meds   LAMINECTOMY     lumbar, [facility]   recurrent kidney stones      Medications:  Current Outpatient Medications on File Prior to Visit  Medication Sig   Alpha-Lipoic Acid 200 MG CAPS Take 1 capsule by mouth daily.   fexofenadine-pseudoephedrine (ALLEGRA-D 24) 180-240 MG 24 hr tablet Take 1 tablet by mouth daily as needed.   fluticasone (FLONASE) 50 MCG/ACT nasal spray Place 1 spray into both nostrils as needed for allergies or rhinitis.   glimepiride (AMARYL) 2 MG tablet TAKE 1 TABLET (2 MG TOTAL) BY MOUTH 2 (TWO) TIMES DAILY BEFORE A MEAL.   glucose blood test strip 1 each by Other route as needed for other. Use as instructed   lisinopril (ZESTRIL) 10 MG tablet Take 1 tablet (10 mg total) by mouth daily.   polyethylene glycol (MIRALAX / GLYCOLAX) packet Take 17 g by mouth daily as needed.   No current facility-administered medications on file prior to visit.    Allergies:  Allergies  Allergen Reactions   Ergocalciferol Other (See Comments)    Insomnia   Nsaids     Avoids because of kidney issues   Other    Pollen Extract Other (See Comments)    Sneezing, watery eyes, sinus drainage   Shellfish Allergy Other (See Comments)    Cannot have IVP due. Pt has renal failure   Vitamin D Analogs Other (See Comments)    Insomnia    Social History:  Social History   Socioeconomic History   Marital status: Married    Spouse name: Not on file   Number of children: Not on file   Years of education: Not on file   Highest education level: Not on file  Occupational History   Not on file  Tobacco Use   Smoking status: Never    Passive exposure: Never   Smokeless tobacco: Never  Vaping Use   Vaping Use: Never used  Substance and Sexual Activity   Alcohol use: No    Alcohol/week: 0.0 standard drinks of alcohol    Drug use: No   Sexual activity: Yes  Other Topics Concern   Not on file  Social History Narrative   Not on file   Social Determinants of Health   Financial Resource Strain: Low Risk  (11/29/2022)   Overall Financial Resource Strain (CARDIA)    Difficulty of Paying Living Expenses: Not hard at all  Food Insecurity: No Food Insecurity (11/29/2022)   Hunger Vital Sign    Worried About Running Out of Food in the Last Year: Never true    [place] in the Last Year: Never true  Transportation Needs: No Transportation Needs (11/29/2022)   PRAPARE - Hydrologist (Medical): No    Lack of Transportation (Non-Medical): No  Physical Activity: Insufficiently Active (11/29/2022)   Exercise Vital Sign    Days of Exercise per Week: 3 days    Minutes of Exercise per Session: 20 min  Stress: No Stress Concern Present (11/29/2022)   [place]    Feeling of Stress : Not at all  Social Connections: [place] (11/29/2022)   Social Connection and Isolation Panel [NHANES]    Frequency of Communication with Friends and Family: More than three times a week    Frequency of Social Gatherings with Friends and Family: More than three times a week    Attends Religious Services:  More than 4 times per year    Active Member of Clubs or Organizations: No    Attends Archivist Meetings: 1 to 4 times per year    Marital Status: Married  Human resources officer Violence: Not At Risk (11/29/2022)   Humiliation, Afraid, Rape, and Kick questionnaire    Fear of Current or Ex-Partner: No    Emotionally Abused: No    Physically Abused: No    Sexually Abused: No   Social History   Tobacco Use  Smoking Status Never   Passive exposure: Never  Smokeless Tobacco Never   Social History   Substance and Sexual Activity  Alcohol Use No   Alcohol/week: 0.0 standard drinks of alcohol    Family History:  Family  History  Problem Relation Age of Onset   Diabetes Mother    Heart disease Mother    Cancer Mother        breast and lung   Stroke Mother    Heart attack Mother    Cancer Father        pancreatic   Hypertension Brother    Hyperlipidemia Brother    Diabetes Brother    Diabetes Maternal Grandmother    Diabetes Paternal Grandmother     Past medical history, surgical history, medications, allergies, family history and social history reviewed with patient today and changes made to appropriate areas of the chart.   ROS All other ROS negative except what is listed above and in the HPI.      Objective:    BP 111/70   Pulse 75   Temp 98.3 F (36.8 C) (Oral)   Ht 5' 6.14" (1.68 m)   Wt 206 lb 8 oz (93.7 kg)   SpO2 95%   BMI 33.19 kg/m   Wt Readings from Last 3 Encounters:  11/29/22 206 lb 8 oz (93.7 kg)  08/21/22 211 lb (95.7 kg)  05/22/22 209 lb 12.8 oz (95.2 kg)    Physical Exam Vitals and nursing note reviewed.  Constitutional:      General: He is awake. He is not in acute distress.    Appearance: He is well-developed and well-groomed. He is obese. He is not ill-appearing or toxic-appearing.  HENT:     Head: Normocephalic and atraumatic.     Right Ear: Hearing normal. No drainage.     Left Ear: Hearing normal. No drainage.  Eyes:     General: Lids are normal.        Right eye: No discharge.        Left eye: No discharge.     Conjunctiva/sclera: Conjunctivae normal.     Pupils: Pupils are equal, round, and reactive to light.  Neck:     Vascular: No carotid bruit.  Cardiovascular:     Rate and Rhythm: Normal rate and regular rhythm.     Heart sounds: S1 normal and S2 normal. Murmur heard.     Systolic murmur is present with a grade of 2/6.     No gallop.  Pulmonary:     Effort: Pulmonary effort is normal. No accessory muscle usage or respiratory distress.     Breath sounds: Normal breath sounds.  Abdominal:     General: Bowel sounds are normal.     Palpations:  Abdomen is soft.  Musculoskeletal:        General: Normal range of motion.     Cervical back: Normal range of motion and neck supple.     Right lower leg: No edema.  Left lower leg: No edema.  Skin:    General: Skin is warm and dry.     Capillary Refill: Capillary refill takes less than 2 seconds.  Neurological:     Mental Status: He is alert and oriented to person, place, and time.  Psychiatric:        Attention and Perception: Attention normal.        Mood and Affect: Mood normal.        Speech: Speech normal.        Behavior: Behavior normal. Behavior is cooperative.        Thought Content: Thought content normal.    Diabetic Foot Exam - Simple   Simple Foot Form Visual Inspection See comments: Yes Sensation Testing See comments: Yes Pulse Check Posterior Tibialis and Dorsalis pulse intact bilaterally: Yes Comments Xerosis bilateral feet.  Sensation left 7/10 and right 9/10.        11/29/2022    8:44 AM  6CIT Screen  What Year? 0 points  What month? 0 points  What time? 0 points  Count back from 20 0 points  Months in reverse 0 points  Repeat phrase 0 points  Total Score 0 points   Results for orders placed or performed in visit on 11/29/22  Bayer DCA Hb A1c Waived  Result Value Ref Range   HB A1C (BAYER DCA - WAIVED) 7.1 (H) 4.8 - 5.6 %  Microalbumin, Urine Waived  Result Value Ref Range   Microalb, Ur Waived 80 (H) 0 - 19 mg/L   Creatinine, Urine Waived 50 10 - 300 mg/dL   Microalb/Creat Ratio >300 (H) <30 mg/g      Assessment & Plan:   Problem List Items Addressed This Visit       Cardiovascular and Mediastinum   Aortic valve calcification    Stable with no symptoms.  Continue to monitor and return to cardiology as needed.      Relevant Medications   atorvastatin (LIPITOR) 20 MG tablet   Hypertension associated with diabetes (HCC)    Chronic, stable.  BP at goal in office and at home.  Continue current medication regimen and adjust as needed.   Recommend checking BP at least three times a week at home.  Focus on DASH diet.  LABS: CBC, CMP, TSH, urine ALB.  Return in 6 months.      Relevant Medications   atorvastatin (LIPITOR) 20 MG tablet   Other Relevant Orders   Bayer DCA Hb A1c Waived (Completed)   Microalbumin, Urine Waived (Completed)   Comprehensive metabolic panel   CBC with Differential/Platelet   TSH     Respiratory   OSA on CPAP    Chronic.  100% use of CPAP at home, continue adherent regimen.      Relevant Orders   Magnesium     Digestive   GERD (gastroesophageal reflux disease)    Chronic, stable with Prilosec use.  Continue current regimen and check Mag level annually. Risks of PPI use were discussed with patient including bone loss, C. Diff diarrhea, pneumonia, infections, CKD, electrolyte abnormalities.  Verbalizes understanding and chooses to continue the medication.       Relevant Medications   omeprazole (PRILOSEC) 20 MG capsule   Pancreatic cyst    Chronic.  Monitored by [facility] providers, continue collaboration.  Reviewed recent notes.        Endocrine   Controlled type 2 diabetes mellitus with diabetic neuropathy, without long-term current use of insulin ([place])  Chronic, ongoing with A1c 7.1% today, trend up due to diet.  Continue current medication regimen and adjust as needed, discussed with him to closely monitor BS with this and notify provider if consistent <70.  He wishes to focus on diet and exercise.  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  Monitor use of Glimepiride due to age and if BS consistently <70 consider alternate options.  Check BS at home as least three mornings a week.  Return in 6 months.       Relevant Medications   atorvastatin (LIPITOR) 20 MG tablet   Other Relevant Orders   Bayer DCA Hb A1c Waived (Completed)   Microalbumin, Urine Waived (Completed)   Comprehensive metabolic panel   Vitamin I50   Hyperlipidemia associated with type 2 diabetes  mellitus (HCC)    Chronic, ongoing.  Continue current medication regimen and adjust as needed.  Lipid panel today.      Relevant Medications   atorvastatin (LIPITOR) 20 MG tablet   Other Relevant Orders   Bayer DCA Hb A1c Waived (Completed)   Comprehensive metabolic panel   Lipid Panel w/o Chol/HDL Ratio   Hyperparathyroidism due to renal insufficiency (HCC)    Chronic, ongoing.  Followed by nephrology at [facility], recent notes and labs reviewed.        Genitourinary   BPH (benign prostatic hyperplasia)    Chronic, stable on current regimen.  Followed by urology, continue this collaboration, recent notes reviewed.      Relevant Medications   finasteride (PROSCAR) 5 MG tablet   tamsulosin (FLOMAX) 0.4 MG CAPS capsule   CKD (chronic kidney disease), stage III (HCC)    Chronic, ongoing.  Monitored by nephrology at [facility], continue collaboration and Lisinopril at this time.  Recent notes reviewed.  Labs today.      Relevant Orders   Microalbumin, Urine Waived (Completed)     Other   BMI 33.0-33.9,adult    Refer to obesity plan of care.      Obesity    BMI 33.19.  Recommended eating smaller high protein, low fat meals more frequently and exercising 30 mins a day 5 times a week with a goal of 10-15lb weight loss in the next 3 months. Patient voiced their understanding and motivation to adhere to these recommendations.       Other Visit Diagnoses     Medicare annual wellness visit, subsequent    -  Primary   Medicare wellness due and performed in office today with patient.   Flu vaccine need       Flu vaccine in office today, discussed with patient.   Relevant Orders   Flu Vaccine QUAD High Dose(Fluad) (Completed)       Discussed aspirin prophylaxis for myocardial infarction prevention and decision was it was not indicated  LABORATORY TESTING:  Health maintenance labs ordered today as discussed above.   IMMUNIZATIONS:   - Tdap: Tetanus vaccination status reviewed: last  tetanus booster within 10 years. - Influenza: Up to date - Pneumovax: Up to date - Prevnar: Up to date - Zostavax vaccine: Up to date  SCREENING: - Colonoscopy: Up to date  Discussed with patient purpose of the colonoscopy is to detect colon cancer at curable precancerous or early stages   - AAA Screening: Not applicable  -Hearing Test: Not applicable  -Spirometry: Not applicable   PATIENT COUNSELING:    Sexuality: Discussed sexually transmitted diseases, partner selection, use of condoms, avoidance of unintended pregnancy  and contraceptive alternatives.  Advised to avoid cigarette smoking.  I discussed with the patient that most people either abstain from alcohol or drink within safe limits (<=14/week and <=4 drinks/occasion for males, <=7/weeks and <= 3 drinks/occasion for females) and that the risk for alcohol disorders and other health effects rises proportionally with the number of drinks per week and how often a drinker exceeds daily limits.  Discussed cessation/primary prevention of drug use and availability of treatment for abuse.   Diet: Encouraged to adjust caloric intake to maintain  or achieve ideal body weight, to reduce intake of dietary saturated fat and total fat, to limit sodium intake by avoiding high sodium foods and not adding table salt, and to maintain adequate dietary potassium and calcium preferably from fresh fruits, vegetables, and low-fat dairy products.    Stressed the importance of regular exercise  Injury prevention: Discussed safety belts, safety helmets, smoke detector, smoking near bedding or upholstery.   Dental health: Discussed importance of regular tooth brushing, flossing, and dental visits.   Follow up plan: NEXT PREVENTATIVE PHYSICAL DUE IN 1 YEAR. Return in about 6 months (around 05/30/2023) for T2DM, HTN/HLD, GERD.

## 2022-11-29 NOTE — Assessment & Plan Note (Signed)
Chronic, stable on current regimen.  Followed by urology, continue this collaboration, recent notes reviewed.

## 2022-11-29 NOTE — Assessment & Plan Note (Signed)
BMI 33.19.  Recommended eating smaller high protein, low fat meals more frequently and exercising 30 mins a day 5 times a week with a goal of 10-15lb weight loss in the next 3 months. Patient voiced their understanding and motivation to adhere to these recommendations.

## 2022-11-30 LAB — COMPREHENSIVE METABOLIC PANEL
ALT: 33 IU/L (ref 0–44)
AST: 23 IU/L (ref 0–40)
Albumin/Globulin Ratio: 1.6 (ref 1.2–2.2)
Albumin: 4.1 g/dL (ref 3.9–4.9)
Alkaline Phosphatase: 72 IU/L (ref 44–121)
BUN/Creatinine Ratio: 16 (ref 10–24)
BUN: 35 mg/dL — ABNORMAL HIGH (ref 8–27)
Bilirubin Total: 0.3 mg/dL (ref 0.0–1.2)
CO2: 20 mmol/L (ref 20–29)
Calcium: 9.2 mg/dL (ref 8.6–10.2)
Chloride: 106 mmol/L (ref 96–106)
Creatinine, Ser: 2.13 mg/dL — ABNORMAL HIGH (ref 0.76–1.27)
Globulin, Total: 2.6 g/dL (ref 1.5–4.5)
Glucose: 104 mg/dL — ABNORMAL HIGH (ref 70–99)
Potassium: 4.4 mmol/L (ref 3.5–5.2)
Sodium: 140 mmol/L (ref 134–144)
Total Protein: 6.7 g/dL (ref 6.0–8.5)
eGFR: 33 mL/min/{1.73_m2} — ABNORMAL LOW (ref >59)

## 2022-11-30 LAB — CBC WITH DIFFERENTIAL/PLATELET
Basophils Absolute: 0.1 10*3/uL (ref 0.0–0.2)
Basos: 1 %
EOS (ABSOLUTE): 0.5 10*3/uL — ABNORMAL HIGH (ref 0.0–0.4)
Eos: 8 %
Hematocrit: 37.9 % (ref 37.5–51.0)
Hemoglobin: 12.5 g/dL — ABNORMAL LOW (ref 13.0–17.7)
Immature Grans (Abs): 0 10*3/uL (ref 0.0–0.1)
Immature Granulocytes: 0 %
Lymphocytes Absolute: 1.3 10*3/uL (ref 0.7–3.1)
Lymphs: 22 %
MCH: 30.2 pg (ref 26.6–33.0)
MCHC: 33 g/dL (ref 31.5–35.7)
MCV: 92 fL (ref 79–97)
Monocytes Absolute: 0.7 10*3/uL (ref 0.1–0.9)
Monocytes: 11 %
Neutrophils Absolute: 3.5 10*3/uL (ref 1.4–7.0)
Neutrophils: 58 %
Platelets: 153 10*3/uL (ref 150–450)
RBC: 4.14 x10E6/uL (ref 4.14–5.80)
RDW: 13.6 % (ref 11.6–15.4)
WBC: 6 10*3/uL (ref 3.4–10.8)

## 2022-11-30 LAB — LIPID PANEL W/O CHOL/HDL RATIO
Cholesterol, Total: 118 mg/dL (ref 100–199)
HDL: 36 mg/dL — ABNORMAL LOW (ref >39)
LDL Chol Calc (NIH): 64 mg/dL (ref 0–99)
Triglycerides: 93 mg/dL (ref 0–149)
VLDL Cholesterol Cal: 18 mg/dL (ref 5–40)

## 2022-11-30 LAB — TSH: TSH: 3.1 u[IU]/mL (ref 0.450–4.500)

## 2022-11-30 LAB — MAGNESIUM: Magnesium: 2 mg/dL (ref 1.6–2.3)

## 2022-11-30 NOTE — Progress Notes (Signed)
Contacted via [place] afternoon [place], still waiting on B12 level, but remainder labs are present.  Kidney function remains at baseline stage 3b kidney disease, no worsening.  Liver function, AST and ALT, is normal.  CBC shows mild low hemoglobin which we will continue to monitor for anemia.  Remainder of current labs are stable.  Any questions? Keep being stellar!!  Thank you for allowing me to participate in your care.  I appreciate you. Kindest regards, Makih Stefanko

## 2022-12-01 ENCOUNTER — Encounter: Payer: Self-pay | Admitting: Nurse Practitioner

## 2022-12-01 DIAGNOSIS — E538 Deficiency of other specified B group vitamins: Secondary | ICD-10-CM | POA: Insufficient documentation

## 2022-12-01 LAB — VITAMIN B12: Vitamin B-12: 358 pg/mL (ref 232–1245)

## 2022-12-01 LAB — SPECIMEN STATUS REPORT

## 2022-12-01 NOTE — Progress Notes (Signed)
Contacted via MyChart   Good morning Thomas Huang, your lab for B12 level has returned and is on lower side of normal.  We like to see above 300.  Lower levels can at times cause neuropathy discomfort.  I recommend starting a B12 supplement 1000 MCG daily which you can obtain over the counter.  Any questions on this?

## 2023-01-20 ENCOUNTER — Other Ambulatory Visit: Payer: Self-pay | Admitting: Family Medicine

## 2023-01-20 DIAGNOSIS — E1121 Type 2 diabetes mellitus with diabetic nephropathy: Secondary | ICD-10-CM

## 2023-01-21 NOTE — Telephone Encounter (Signed)
Requested Prescriptions  Pending Prescriptions Disp Refills   glimepiride (AMARYL) 2 MG tablet [Pharmacy Med Name: GLIMEPIRIDE 2 MG TABLET] 180 tablet 0    Sig: TAKE 1 TABLET (2 MG TOTAL) BY MOUTH 2 (TWO) TIMES DAILY BEFORE A MEAL.     Endocrinology:  Diabetes - Sulfonylureas Failed - 01/20/2023  8:43 AM      Failed - Cr in normal range and within 360 days    Creatinine  Date Value Ref Range Status  05/01/2012 1.81 (H) 0.60 - 1.30 mg/dL Final   Creatinine, Ser  Date Value Ref Range Status  11/29/2022 2.13 (H) 0.76 - 1.27 mg/dL Final         Passed - HBA1C is between 0 and 7.9 and within 180 days    HB A1C (BAYER DCA - WAIVED)  Date Value Ref Range Status  11/29/2022 7.1 (H) 4.8 - 5.6 % Final    Comment:             Prediabetes: 5.7 - 6.4          Diabetes: >6.4          Glycemic control for adults with diabetes: <7.0          Passed - Valid encounter within last 6 months    Recent Outpatient Visits           1 month ago Medicare annual wellness visit, subsequent   [facility] [place], Gleed T, NP   8 months ago Subacute cough   [facility] Haddock, NP   1 year ago Need for influenza vaccination   [facility] Vigg, Avanti, MD   1 year ago Hyperlipidemia associated with type 2 diabetes mellitus ([place])    [facility] Vigg, Avanti, MD   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

## 2023-02-04 ENCOUNTER — Ambulatory Visit: Payer: Self-pay | Admitting: *Deleted

## 2023-02-04 NOTE — Telephone Encounter (Signed)
  Chief Complaint: Positive for Covid Symptoms: Runny nose, sore throat, cough, sneezing a lot, low grade fever, fatigue Frequency: Symptoms started Sat. Morning with a sore throat Pertinent Negatives: Patient denies shortness of breath or chest tightness Disposition: [] ED /[] Urgent Care (no appt availability in office) / [x] Appointment(In office/virtual)/ []  [facility]/ [] Home Care/ [] Refused Recommended Disposition /[] [facility]/ []  Follow-up with PCP Additional Notes: Appt. Made with Jon Billings, NP for 02/06/2023 at 3:40.    Instructed to call back if he develops shortness of breath or chest tightness.

## 2023-02-04 NOTE — Telephone Encounter (Signed)
Message from [employer] sent at 02/04/2023 12:22 PM EDT  Summary: cold and flu like symptoms / rx req   The patient has tested positive for COVID 19  The patient is experiencing congestion, headache, cough, sore throat  The patient would like to be prescribed something for their discomfort  Please contact further when possible          Call History   Type Contact Phone/Fax User  02/04/2023 12:22 PM EDT Phone (Incoming) Thomas Huang, Thomas Huang (Self) [phone] Jerilynn Mages) Coley, Everette A   Reason for Disposition  [1] HIGH RISK patient (e.g., weak immune system, age > 25 years, obesity with BMI 30 or higher, pregnant, chronic lung disease or other chronic medical condition) AND [2] COVID symptoms (e.g., cough, fever)  (Exceptions: Already seen by PCP and no new or worsening symptoms.)  Answer Assessment - Initial Assessment Questions 1. COVID-19 DIAGNOSIS: "How do you know that you have COVID?" (e.g., positive lab test or self-test, diagnosed by doctor or NP/PA, symptoms after exposure).     Covid positive  2 home tests.   Both tests positive.   2. COVID-19 EXPOSURE: "Was there any known exposure to COVID before the symptoms began?" CDC Definition of close contact: within 6 feet (2 meters) for a total of 15 minutes or more over a 24-hour period.      My wife has been sick with same s 3. ONSET: "When did the COVID-19 symptoms start?"      Sat morning woke up with a sore throat and have gotten worse.   My nose is running, coughing and fatigue.    Low grade fever 4. WORST SYMPTOM: "What is your worst symptom?" (e.g., cough, fever, shortness of breath, muscle aches)     Not asked 5. COUGH: "Do you have a cough?" If Yes, ask: "How bad is the cough?"       Yes    Coughing up a little mucus.    I'm using cough syrup.   6. FEVER: "Do you have a fever?" If Yes, ask: "What is your temperature, how was it measured, and when did it start?"     Low grade Using warm salt water 7. RESPIRATORY STATUS:  "Describe your breathing?" (e.g., normal; shortness of breath, wheezing, unable to speak)      No shortness of breath or chest tightness. 8. BETTER-SAME-WORSE: "Are you getting better, staying the same or getting worse compared to yesterday?"  If getting worse, ask, "In what way?"     Not asked 9. OTHER SYMPTOMS: "Do you have any other symptoms?"  (e.g., chills, fatigue, headache, loss of smell or taste, muscle pain, sore throat)     Fatigue, running nose, sore throat, sneezing a lot. 10. HIGH RISK DISEASE: "Do you have any chronic medical problems?" (e.g., asthma, heart or lung disease, weak immune system, obesity, etc.)       Kidney operating for 35% for 10 yrs       No heart problems.   11. VACCINE: "Have you had the COVID-19 vaccine?" If Yes, ask: "Which one, how many shots, when did you get it?"       Not asked 12. PREGNANCY: "Is there any chance you are pregnant?" "When was your last menstrual period?"       N/A 13. O2 SATURATION MONITOR:  "Do you use an oxygen saturation monitor (pulse oximeter) at home?" If Yes, ask "What is your reading (oxygen level) today?" "What is your usual oxygen saturation reading?" (e.g., 95%)  N/A  Protocols used: Coronavirus (VVZSM-27) Diagnosed or Suspected-A-AH

## 2023-02-06 ENCOUNTER — Ambulatory Visit: Payer: Medicare Other | Admitting: Nurse Practitioner

## 2023-04-20 IMAGING — CR DG ABDOMEN 1V
2 series · 2 of 2 positions shown · non-contrast
Comparison: 08/07/2010

CLINICAL DATA: Nephrolithiasis

EXAM:
ABDOMEN - 1 VIEW

[abdomen kub (1 of 2)]
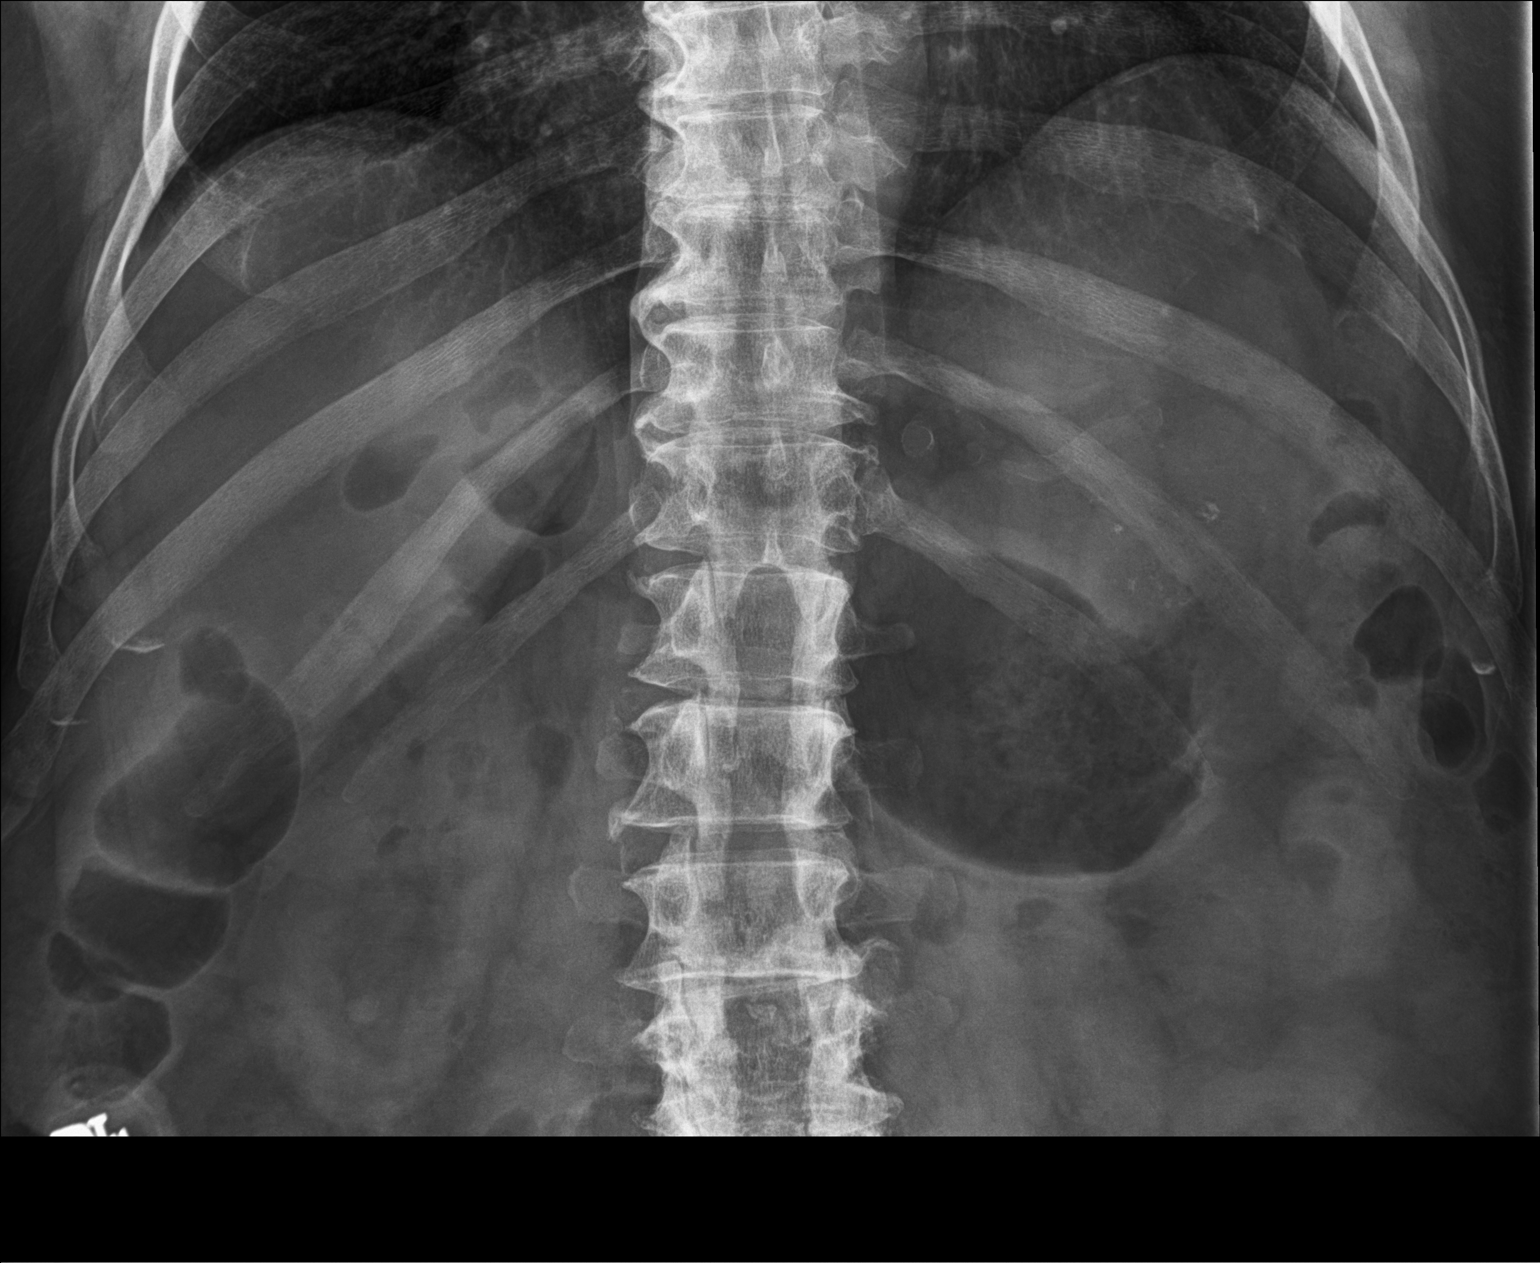

[abdomen kub (2 of 2)]
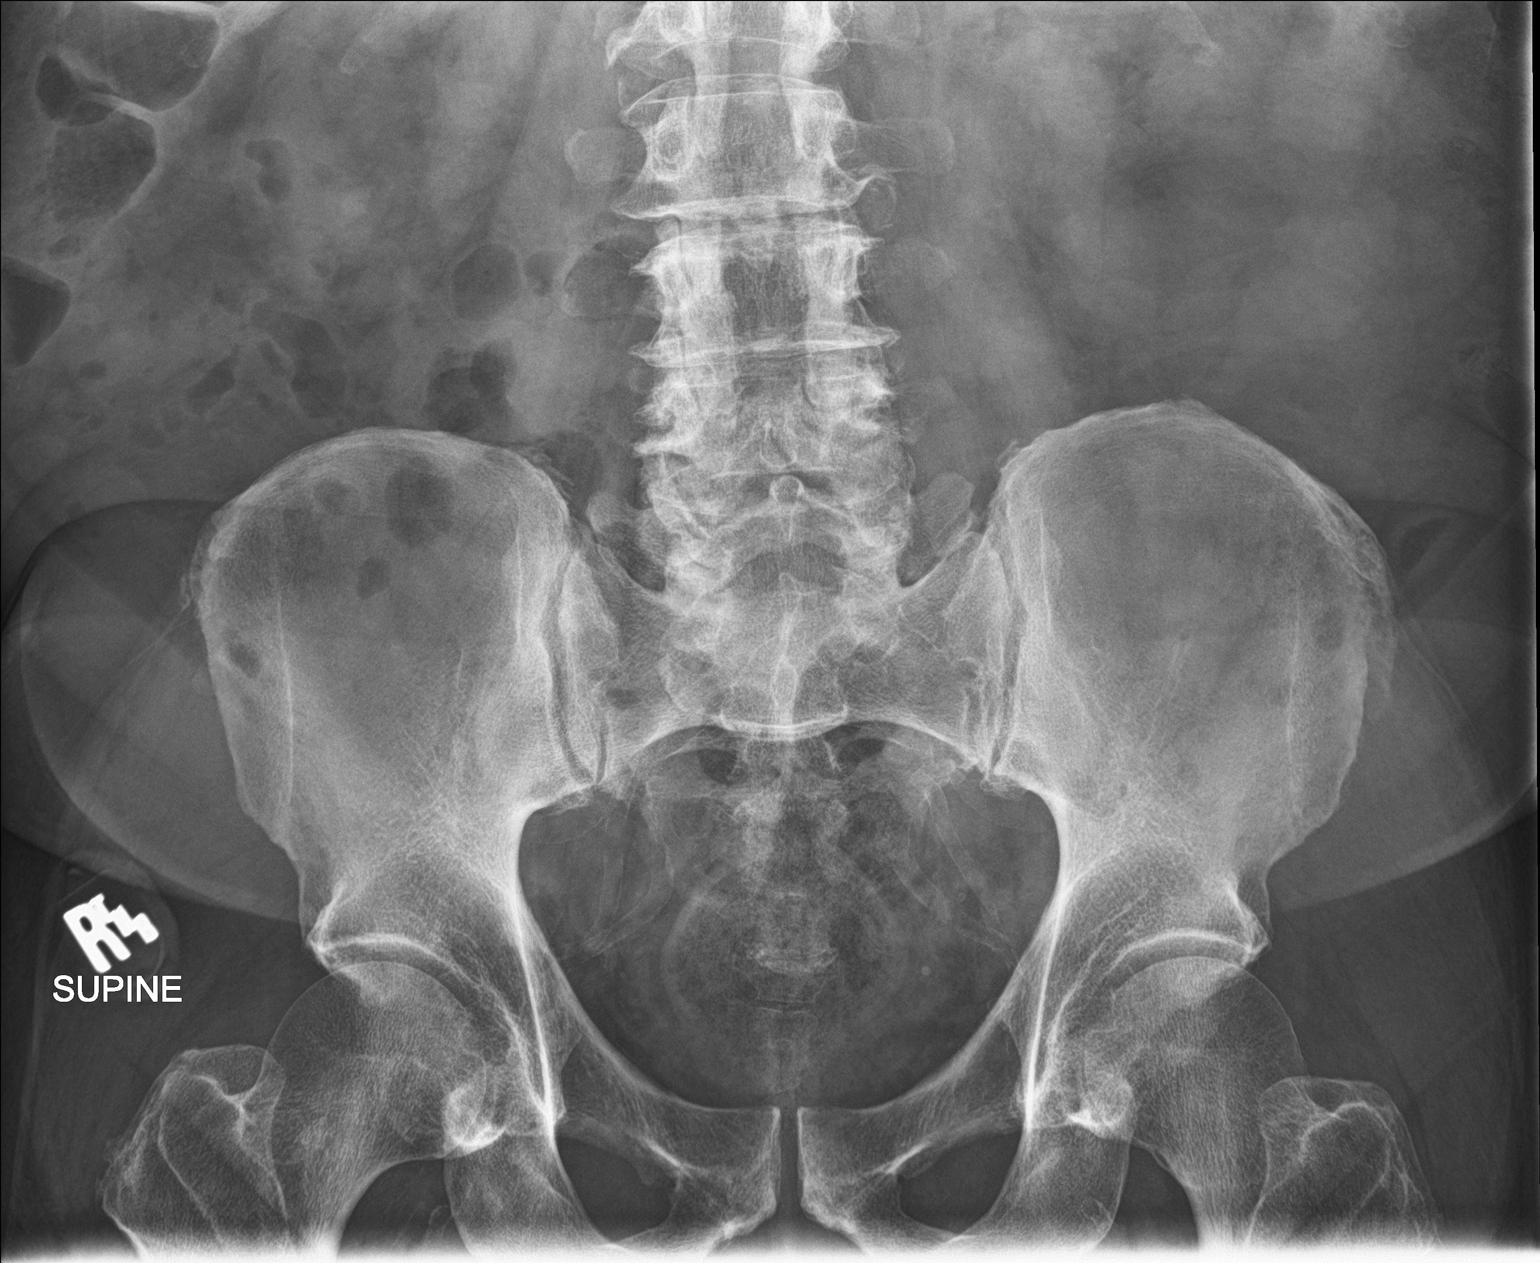

[2 of 2 positions shown; findings below may reference images not displayed]

FINDINGS: Multiple punctate calculi within the left upper quadrant may
represent splenic or pancreatic calcifications, but appear remote
from the expected position of the left kidney. 12 mm calculus
overlies the lower pole of the right kidney. No definite left-sided
nephrolithiasis. No urolithiasis. Phleboliths noted within the left
hemipelvis. Vascular calcifications are noted within the pelvis.
Normal abdominal gas pattern. No organomegaly.
IMPRESSION: 12 mm calculus overlies the lower pole the right kidney. No ureteral
calculi.

## 2023-05-25 NOTE — Patient Instructions (Signed)
Food Basics for Chronic Kidney Disease Chronic kidney disease (CKD) is when your kidneys are not working well. They cannot remove waste, fluids, and other substances from your blood the way they should. These substances can build up, which can worsen kidney damage and affect how your body works. Eating certain foods can lead to a buildup of these substances. Changing your diet can help prevent more kidney damage. Diet changes may also delay dialysis or even keep you from needing it. What nutrients should I limit? Work with your treatment team and a food expert (dietitian) to make a meal plan that's right for you. Foods you can eat and foods you should limit or avoid will depend on the stage of your kidney disease and any other health conditions you have. The items listed below are not a complete list. Talk with your dietitian to learn what is best for you. Potassium Potassium affects how steadily your heart beats. Too much potassium in your blood can cause an irregular heartbeat or even a heart attack. You may need to limit foods that are high in potassium, such as: Liquid milk and soy milk. Salt substitutes that contain potassium. Fruits like bananas, apricots, nectarines, melon, prunes, raisins, kiwi, and oranges. Vegetables, such as potatoes, sweet potatoes, yams, tomatoes, leafy greens, beets, avocado, pumpkin, and winter squash. Beans, like lima beans. Nuts. Phosphorus Phosphorus is a mineral found in your bones. You need a balance between calcium and phosphorus to build and maintain healthy bones. Too much added phosphorus from the foods you eat can pull calcium from your bones. Losing calcium can make your bones weak and more likely to break. Too much phosphorus can also make your skin itch. You may need to limit foods that are high in phosphorus or that have added phosphorus, such as: Liquid milk and dairy products. Dark-colored sodas or soft drinks. Bran cereals and  oatmeal. Protein  Protein helps you make and keep muscle. Protein also helps to repair your body's cells and tissues. One of the natural breakdown products of protein is a waste product called urea. When your kidneys are not working well, they cannot remove types of waste like urea. Reducing protein in your diet can help keep urea from building up in your blood. Depending on your stage of kidney disease, you may need to eat smaller portions of foods that are high in protein. Sources of animal protein include: Meat (all types). Fish and seafood. Poultry. Eggs. Dairy. Other protein foods include: Beans and legumes. Nuts and nut butter. Soy, like tofu.  Sodium Salt (sodium) helps to keep a healthy balance of fluids in your body. Too much salt can increase your blood pressure, which can harm your heart and lungs. Extra salt can also cause your body to keep too much fluid, making your kidneys work harder. You may need to limit or avoid foods that are high in salt, such as: Salt seasonings. Soy and teriyaki sauce. Packaged, precooked, cured, or processed meats, such as sausages or meat loaves. Sardines. Salted crackers and snack foods. Fast food. Canned soups and most canned foods. Pickled foods. Vegetable juice. Boxed mixes or ready-to-eat boxed meals and side dishes. Bottled dressings, sauces, and marinades. Talk with your dietitian about how much potassium, phosphorus, protein, and salt you may have each day. Helpful tips Read food labels  Check the amount of salt in foods. Limit foods that have salt or sodium listed among the first five ingredients. Try to eat low-salt foods. Check the ingredient list   for added phosphorus or potassium. "Phos" in an ingredient is a sign that phosphorus has been added. Do not buy foods that are calcium-enriched or that have calcium added to them (are fortified). Buy canned vegetables and beans that say "no salt added" and rinse them before  eating. Lifestyle Limit the amount of protein you eat from animal sources each day. Focus on protein from plant sources, like tofu and dried beans, peas, and lentils. Do not add salt to food when cooking or before eating. Do not eat star fruit. It can be toxic for people with kidney problems. Talk with your health care provider before taking any vitamin or mineral supplements. If told by your health care provider, track how much liquid you drink so you can avoid drinking too much. You may need to include foods you eat that are made mostly from water, like gelatin, ice cream, soups, and juicy fruits and vegetables. If you have diabetes: If you have diabetes (diabetes mellitus) and CKD, you need to keep your blood sugar (glucose) in the target range recommended by your health care provider. Follow your diabetes management plan. This may include: Checking your blood glucose regularly. Taking medicines by mouth, or taking insulin, or both. Exercising for at least 30 minutes on 5 or more days each week, or as told by your health care provider. Tracking how many servings of carbohydrates you eat at each meal. Not using orange juice to treat low blood sugars. Instead, use apple juice, cranberry juice, or clear soda. You may be given guidelines on what foods and nutrients you may eat, and how much you can have each day. This depends on your stage of kidney disease and whether you have high blood pressure (hypertension). Follow the meal plan your dietitian gives you. To learn more: National Institute of Diabetes and Digestive and Kidney Diseases: niddk.nih.gov National Kidney Foundation: kidney.org Summary Chronic kidney disease (CKD) is when your kidneys are not working well. They cannot remove waste, fluids, and other substances from your blood the way they should. These substances can build up, which can worsen kidney damage and affect how your body works. Changing your diet can help prevent more  kidney damage. Diet changes may also delay dialysis or even keep you from needing it. Diet changes are different for each person with CKD. Work with a dietitian to set up a meal plan that is right for you. This information is not intended to replace advice given to you by your health care provider. Make sure you discuss any questions you have with your health care provider. Document Revised: 03/02/2022 Document Reviewed: 03/07/2020 Elsevier Patient Education  2024 Elsevier Inc.  

## 2023-05-31 ENCOUNTER — Encounter: Payer: Self-pay | Admitting: Nurse Practitioner

## 2023-05-31 ENCOUNTER — Ambulatory Visit: Payer: Medicare Other | Admitting: Nurse Practitioner

## 2023-05-31 ENCOUNTER — Ambulatory Visit (INDEPENDENT_AMBULATORY_CARE_PROVIDER_SITE_OTHER): Payer: Medicare Other | Admitting: Nurse Practitioner

## 2023-05-31 VITALS — BP 115/66 | HR 73 | Temp 98.3°F | Ht 66.14 in | Wt 204.0 lb

## 2023-05-31 DIAGNOSIS — E1169 Type 2 diabetes mellitus with other specified complication: Secondary | ICD-10-CM

## 2023-05-31 DIAGNOSIS — K219 Gastro-esophageal reflux disease without esophagitis: Secondary | ICD-10-CM

## 2023-05-31 DIAGNOSIS — E6609 Other obesity due to excess calories: Secondary | ICD-10-CM

## 2023-05-31 DIAGNOSIS — E538 Deficiency of other specified B group vitamins: Secondary | ICD-10-CM

## 2023-05-31 DIAGNOSIS — N1832 Chronic kidney disease, stage 3b: Secondary | ICD-10-CM

## 2023-05-31 DIAGNOSIS — E1140 Type 2 diabetes mellitus with diabetic neuropathy, unspecified: Secondary | ICD-10-CM | POA: Diagnosis not present

## 2023-05-31 DIAGNOSIS — N2581 Secondary hyperparathyroidism of renal origin: Secondary | ICD-10-CM

## 2023-05-31 DIAGNOSIS — E785 Hyperlipidemia, unspecified: Secondary | ICD-10-CM

## 2023-05-31 DIAGNOSIS — G4733 Obstructive sleep apnea (adult) (pediatric): Secondary | ICD-10-CM

## 2023-05-31 DIAGNOSIS — E1159 Type 2 diabetes mellitus with other circulatory complications: Secondary | ICD-10-CM | POA: Diagnosis not present

## 2023-05-31 DIAGNOSIS — K862 Cyst of pancreas: Secondary | ICD-10-CM

## 2023-05-31 DIAGNOSIS — I152 Hypertension secondary to endocrine disorders: Secondary | ICD-10-CM

## 2023-05-31 LAB — BAYER DCA HB A1C WAIVED: HB A1C (BAYER DCA - WAIVED): 6.6 % — ABNORMAL HIGH (ref 4.8–5.6)

## 2023-05-31 MED ORDER — GLIMEPIRIDE 2 MG PO TABS: ORAL_TABLET | ORAL | 4 refills | Status: DC

## 2023-05-31 NOTE — Assessment & Plan Note (Signed)
Chronic, ongoing.  Monitored by nephrology at [facility], continue collaboration and Lisinopril at this time.  Recent notes reviewed.  Labs up to date with nephrology.

## 2023-05-31 NOTE — Assessment & Plan Note (Signed)
Chronic, stable.  BP at goal in office and at home.  Continue current medication regimen and adjust as needed.  Recommend checking BP at least three times a week at home.  Focus on DASH diet.  LABS: up to date with nephrology.  Return in 6 months.

## 2023-05-31 NOTE — Assessment & Plan Note (Signed)
Chronic, stable.  Continue supplement and recheck level next visit. 

## 2023-05-31 NOTE — Assessment & Plan Note (Addendum)
Chronic, stable with minimal Prilosec use.  Continue current regimen and check Mag level annually. Risks of PPI use were discussed with patient including bone loss, C. Diff diarrhea, pneumonia, infections, CKD, electrolyte abnormalities.  Verbalizes understanding and chooses to continue the medication.

## 2023-05-31 NOTE — Assessment & Plan Note (Signed)
Chronic, ongoing.  Continue current medication regimen and adjust as needed. Lipid panel today. 

## 2023-05-31 NOTE — Assessment & Plan Note (Signed)
BMI 32.79.  Recommended eating smaller high protein, low fat meals more frequently and exercising 30 mins a day 5 times a week with a goal of 10-15lb weight loss in the next 3 months. Patient voiced their understanding and motivation to adhere to these recommendations.  

## 2023-05-31 NOTE — Assessment & Plan Note (Signed)
Chronic, ongoing.  Followed by nephrology at [facility], recent notes and labs reviewed. 

## 2023-05-31 NOTE — Assessment & Plan Note (Signed)
Chronic.  100% use of CPAP at home, continue adherent regimen. 

## 2023-05-31 NOTE — Progress Notes (Signed)
BP 115/66   Pulse 73   Temp 98.3 F (36.8 C) (Oral)   Ht 5' 6.14" (1.68 m)   Wt 204 lb (92.5 kg)   SpO2 98%   BMI 32.79 kg/m    Subjective:    Patient ID: Thomas Huang, male    DOB: 08/12/1951, 72 y.o.   MRN: [id number]  HPI: Thomas Huang is a 72 y.o. male  Chief Complaint  Patient presents with   Diabetes   Hypertension   Hyperlipidemia   Gastroesophageal Reflux   DIABETES A1c January was 7.1%.  Continues on Glimepiride 2 MG BID, always takes in morning and takes in evening if thinks he needs it.  He endorses over past 3 months diet has not been good as has been taking care of wife post surgery.  Neuropathy in left foot, tried Gabapentin in past without benefit over 5 month period.  Tried Alpha Lipoic Acid, but this made him feel bad at 600 MG.  Started B12 orally and this has overall helped. Hypoglycemic episodes:no Polydipsia/polyuria: no Visual disturbance: no Chest pain: no Paresthesias: no Glucose Monitoring: yes             Accucheck frequency: not checking             Fasting glucose:              Post prandial:             Evening:             Before meals:  Taking Insulin?: no             Long acting insulin:             Short acting insulin: Blood Pressure Monitoring: daily Retinal Examination: Not Up to Date -- Dr. Larence Penning, needs to schedule Foot Exam: Up to Date Pneumovax: Up to Date Influenza: Up to Date Aspirin: no    HYPERTENSION / HYPERLIPIDEMIA Continues on Lisinopril and Atorvastatin (tried higher doses and different statins in past with joint pain, so is only taking 1/2 a pill = 10 MG).  Uses CPAP 100% at home for OSA.   Echo in 2017 with EF 60-65%, mild to moderately calcified aortic leaflets. Saw cardiology in 2017 and has not returned, heart murmur is present.   Satisfied with current treatment? yes Duration of hypertension: chronic BP monitoring frequency: rarely BP range: <130/80 BP medication side effects: no Duration of  hyperlipidemia: chronic Cholesterol medication side effects: no Cholesterol supplements: none Medication compliance: good compliance Aspirin: no Recent stressors: no Recurrent headaches: no Visual changes: no Palpitations: no Dyspnea: no Chest pain: no Lower extremity edema: no Dizzy/lightheaded: no    CHRONIC KIDNEY DISEASE Sees nephrology at [facility], last visit 05/22/23, sees them once a year.  This visit his levels were improved -- CRT 1.70 and eGFR 43 -- often is in 30 range.   CKD status: stable Medications renally dose: yes Previous renal evaluation: yes Pneumovax:  Up to Date Influenza Vaccine:  Up to Date   GERD Taking Prilosec PRN. They follow for pancreas cyst, imaging done and plan to follow-up in one year.  Follows with GI, last visit 06/08/22.   GERD control status: stable  Satisfied with current treatment? yes Heartburn frequency: intermittent Medication side effects: no  Medication compliance: stable Previous GERD medications: Prilosec Antacid use frequency:  none Dysphagia: no Odynophagia:  no Hematemesis: no Blood in stool: no EGD: no  Relevant past medical, surgical, family and  social history reviewed and updated as indicated. Interim medical history since our last visit reviewed. Allergies and medications reviewed and updated.  Review of Systems  Constitutional:  Negative for activity change, diaphoresis, fatigue and fever.  Respiratory:  Negative for cough, chest tightness, shortness of breath and wheezing.   Cardiovascular:  Negative for chest pain, palpitations and leg swelling.  Gastrointestinal: Negative.   Endocrine: Negative for polydipsia, polyphagia and polyuria.  Neurological: Negative.   Psychiatric/Behavioral: Negative.      Per HPI unless specifically indicated above     Objective:    BP 115/66   Pulse 73   Temp 98.3 F (36.8 C) (Oral)   Ht 5' 6.14" (1.68 m)   Wt 204 lb (92.5 kg)   SpO2 98%   BMI 32.79 kg/m   Wt Readings from  Last 3 Encounters:  05/31/23 204 lb (92.5 kg)  11/29/22 206 lb 8 oz (93.7 kg)  08/21/22 211 lb (95.7 kg)    Physical Exam Vitals and nursing note reviewed.  Constitutional:      General: He is awake. He is not in acute distress.    Appearance: He is well-developed and well-groomed. He is obese. He is not ill-appearing or toxic-appearing.  HENT:     Head: Normocephalic and atraumatic.     Right Ear: Hearing normal. No drainage.     Left Ear: Hearing normal. No drainage.  Eyes:     General: Lids are normal.        Right eye: No discharge.        Left eye: No discharge.     Conjunctiva/sclera: Conjunctivae normal.     Pupils: Pupils are equal, round, and reactive to light.  Neck:     Vascular: No carotid bruit.  Cardiovascular:     Rate and Rhythm: Normal rate and regular rhythm.     Heart sounds: S1 normal and S2 normal. Murmur heard.     Systolic murmur is present with a grade of 2/6.     No gallop.  Pulmonary:     Effort: Pulmonary effort is normal. No accessory muscle usage or respiratory distress.     Breath sounds: Normal breath sounds.  Abdominal:     General: Bowel sounds are normal.     Palpations: Abdomen is soft.  Musculoskeletal:        General: Normal range of motion.     Cervical back: Normal range of motion and neck supple.     Right lower leg: No edema.     Left lower leg: No edema.  Skin:    General: Skin is warm and dry.     Capillary Refill: Capillary refill takes less than 2 seconds.  Neurological:     Mental Status: He is alert and oriented to person, place, and time.  Psychiatric:        Attention and Perception: Attention normal.        Mood and Affect: Mood normal.        Speech: Speech normal.        Behavior: Behavior normal. Behavior is cooperative.        Thought Content: Thought content normal.    Results for orders placed or performed in visit on 11/29/22  Bayer DCA Hb A1c Waived  Result Value Ref Range   HB A1C (BAYER DCA - WAIVED) 7.1  (H) 4.8 - 5.6 %  Microalbumin, Urine Waived  Result Value Ref Range   Microalb, Ur Waived 80 (H) 0 - 19  mg/L   Creatinine, Urine Waived 50 10 - 300 mg/dL   Microalb/Creat Ratio >300 (H) <30 mg/g  Comprehensive metabolic panel  Result Value Ref Range   Glucose 104 (H) 70 - 99 mg/dL   BUN 35 (H) 8 - 27 mg/dL   Creatinine, Ser 4.09 (H) 0.76 - 1.27 mg/dL   eGFR 33 (L) >81 XB/JYN/8.29   BUN/Creatinine Ratio 16 10 - 24   Sodium 140 134 - 144 mmol/L   Potassium 4.4 3.5 - 5.2 mmol/L   Chloride 106 96 - 106 mmol/L   CO2 20 20 - 29 mmol/L   Calcium 9.2 8.6 - 10.2 mg/dL   Total Protein 6.7 6.0 - 8.5 g/dL   Albumin 4.1 3.9 - 4.9 g/dL   Globulin, Total 2.6 1.5 - 4.5 g/dL   Albumin/Globulin Ratio 1.6 1.2 - 2.2   Bilirubin Total 0.3 0.0 - 1.2 mg/dL   Alkaline Phosphatase 72 44 - 121 IU/L   AST 23 0 - 40 IU/L   ALT 33 0 - 44 IU/L  CBC with Differential/Platelet  Result Value Ref Range   WBC 6.0 3.4 - 10.8 x10E3/uL   RBC 4.14 4.14 - 5.80 x10E6/uL   Hemoglobin 12.5 (L) 13.0 - 17.7 g/dL   Hematocrit 56.2 13.0 - 51.0 %   MCV 92 79 - 97 fL   MCH 30.2 26.6 - 33.0 pg   MCHC 33.0 31.5 - 35.7 g/dL   RDW 86.5 78.4 - 69.6 %   Platelets 153 150 - 450 x10E3/uL   Neutrophils 58 Not Estab. %   Lymphs 22 Not Estab. %   Monocytes 11 Not Estab. %   Eos 8 Not Estab. %   Basos 1 Not Estab. %   Neutrophils Absolute 3.5 1.4 - 7.0 x10E3/uL   Lymphocytes Absolute 1.3 0.7 - 3.1 x10E3/uL   Monocytes Absolute 0.7 0.1 - 0.9 x10E3/uL   EOS (ABSOLUTE) 0.5 (H) 0.0 - 0.4 x10E3/uL   Basophils Absolute 0.1 0.0 - 0.2 x10E3/uL   Immature Granulocytes 0 Not Estab. %   Immature Grans (Abs) 0.0 0.0 - 0.1 x10E3/uL  Lipid Panel w/o Chol/HDL Ratio  Result Value Ref Range   Cholesterol, Total 118 100 - 199 mg/dL   Triglycerides 93 0 - 149 mg/dL   HDL 36 (L) >29 mg/dL   VLDL Cholesterol Cal 18 5 - 40 mg/dL   LDL Chol Calc (NIH) 64 0 - 99 mg/dL  TSH  Result Value Ref Range   TSH 3.100 0.450 - 4.500 uIU/mL  Magnesium   Result Value Ref Range   Magnesium 2.0 1.6 - 2.3 mg/dL  Vitamin B28  Result Value Ref Range   Vitamin B-12 358 232 - 1,245 pg/mL  Specimen status report  Result Value Ref Range   specimen status report Comment       Assessment & Plan:   Problem List Items Addressed This Visit       Cardiovascular and Mediastinum   Hypertension associated with diabetes (HCC)    Chronic, stable.  BP at goal in office and at home.  Continue current medication regimen and adjust as needed.  Recommend checking BP at least three times a week at home.  Focus on DASH diet.  LABS: up to date with nephrology.  Return in 6 months.      Relevant Medications   glimepiride (AMARYL) 2 MG tablet   Other Relevant Orders   Bayer DCA Hb A1c Waived     Respiratory   OSA on CPAP  Chronic.  100% use of CPAP at home, continue adherent regimen.        Digestive   GERD (gastroesophageal reflux disease)    Chronic, stable with minimal Prilosec use.  Continue current regimen and check Mag level annually. Risks of PPI use were discussed with patient including bone loss, C. Diff diarrhea, pneumonia, infections, CKD, electrolyte abnormalities.  Verbalizes understanding and chooses to continue the medication.       Pancreatic cyst    Chronic.  Monitored by [facility] providers, continue collaboration.  Reviewed recent notes.        Endocrine   Controlled type 2 diabetes mellitus with diabetic neuropathy, without long-term current use of insulin (HCC) - Primary    Chronic, ongoing with A1c 6.6% today, trend down with some weight loss.  Continue current medication regimen and adjust as needed, discussed with him to closely monitor BS with this and notify provider if consistent <70.  He wishes to focus on diet and exercise.  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  Monitor use of Glimepiride due to age and if BS consistently <70 consider alternate options.  Check BS at home as least three mornings a week.   Return in 6 months.       Relevant Medications   glimepiride (AMARYL) 2 MG tablet   Other Relevant Orders   Bayer DCA Hb A1c Waived   Hyperlipidemia associated with type 2 diabetes mellitus (HCC)    Chronic, ongoing.  Continue current medication regimen and adjust as needed.  Lipid panel today.      Relevant Medications   glimepiride (AMARYL) 2 MG tablet   Other Relevant Orders   Bayer DCA Hb A1c Waived   Lipid Panel w/o Chol/HDL Ratio   Hyperparathyroidism due to renal insufficiency (HCC)    Chronic, ongoing.  Followed by nephrology at [facility], recent notes and labs reviewed.        Genitourinary   CKD (chronic kidney disease), stage III (HCC)    Chronic, ongoing.  Monitored by nephrology at [facility], continue collaboration and Lisinopril at this time.  Recent notes reviewed.  Labs up to date with nephrology.        Other   B12 deficiency    Chronic, stable.  Continue supplement and recheck level next visit.      Obesity    BMI 32.79.  Recommended eating smaller high protein, low fat meals more frequently and exercising 30 mins a day 5 times a week with a goal of 10-15lb weight loss in the next 3 months. Patient voiced their understanding and motivation to adhere to these recommendations.       Relevant Medications   glimepiride (AMARYL) 2 MG tablet     Follow up plan: Return in about 6 months (around 12/09/2023) for Annual Exam after 12/07/23.

## 2023-05-31 NOTE — Assessment & Plan Note (Signed)
Chronic, ongoing with A1c 6.6% today, trend down with some weight loss.  Continue current medication regimen and adjust as needed, discussed with him to closely monitor BS with this and notify provider if consistent <70.  He wishes to focus on diet and exercise.  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  Monitor use of Glimepiride due to age and if BS consistently <70 consider alternate options.  Check BS at home as least three mornings a week.  Return in 6 months.

## 2023-05-31 NOTE — Assessment & Plan Note (Signed)
Chronic.  Monitored by [facility] providers, continue collaboration.  Reviewed recent notes. 

## 2023-06-01 LAB — LIPID PANEL W/O CHOL/HDL RATIO
Cholesterol, Total: 119 mg/dL (ref 100–199)
HDL: 32 mg/dL — ABNORMAL LOW (ref >39)
LDL Chol Calc (NIH): 64 mg/dL (ref 0–99)
Triglycerides: 130 mg/dL (ref 0–149)
VLDL Cholesterol Cal: 23 mg/dL (ref 5–40)

## 2023-06-01 NOTE — Progress Notes (Signed)
Contacted via MyChart   Good evening Thomas Huang, your labs have returned and cholesterol levels remain at goal.  No changes needed.  Great job!!

## 2023-06-03 ENCOUNTER — Ambulatory Visit: Payer: Medicare Other | Admitting: Nurse Practitioner

## 2023-08-27 ENCOUNTER — Ambulatory Visit
Admission: RE | Admit: 2023-08-27 | Discharge: 2023-08-27 | Disposition: A | Discharge status: Home / Self Care | Payer: Medicare Other | Source: Ambulatory Visit | Attending: Urology | Admitting: Urology

## 2023-08-27 ENCOUNTER — Encounter: Payer: Self-pay | Admitting: Urology

## 2023-08-27 ENCOUNTER — Other Ambulatory Visit: Payer: Self-pay

## 2023-08-27 ENCOUNTER — Ambulatory Visit
Admission: RE | Admit: 2023-08-27 | Discharge: 2023-08-27 | Disposition: A | Discharge status: Home / Self Care | Payer: Medicare Other | Attending: Urology | Admitting: Urology

## 2023-08-27 ENCOUNTER — Ambulatory Visit (INDEPENDENT_AMBULATORY_CARE_PROVIDER_SITE_OTHER): Payer: Medicare Other | Admitting: Urology

## 2023-08-27 VITALS — BP 120/70 | HR 73 | Ht 66.14 in | Wt 203.6 lb

## 2023-08-27 DIAGNOSIS — N200 Calculus of kidney: Secondary | ICD-10-CM

## 2023-08-27 DIAGNOSIS — N529 Male erectile dysfunction, unspecified: Secondary | ICD-10-CM | POA: Diagnosis not present

## 2023-08-27 DIAGNOSIS — N401 Enlarged prostate with lower urinary tract symptoms: Secondary | ICD-10-CM | POA: Diagnosis not present

## 2023-08-27 DIAGNOSIS — N138 Other obstructive and reflux uropathy: Secondary | ICD-10-CM

## 2023-08-27 LAB — BLADDER SCAN AMB NON-IMAGING

## 2023-08-27 NOTE — Patient Instructions (Signed)
Avoid diet drinks, as these can irritate the bladder and cause urgency and frequency.  I typically recommend drinking just water for at least 1 to 2 weeks and see how your bladder responds, then slowly add back in certain drinks to see if they bother your symptoms.  If you are interested in penile prosthesis, Dr. Lafonda Mosses in [facility] performs these procedures

## 2023-08-27 NOTE — Progress Notes (Signed)
08/27/2023 1:25 PM   Thomas Huang 11/17/1951 [id number]  Reason for visit: Follow up BPH/LUTS, nephrolithiasis, ED  HPI: 72 year old male we have followed for the above issues.  He has an atrophic left kidney and known right nonobstructing renal stones that have been present for at least 10 years.  He has a distant history of ureteroscopy with Dr. Lonna Cobb.  He thinks he may have had uric acid stones, but did not tolerate potassium citrate secondary to GI upset.  Previously have offered him ureteroscopy and laser lithotripsy for his nonobstructing renal stones and essentially solitary right kidney, but he has deferred.  Over the last year he thinks he may have passed 1 small stone that was minimally painful.  He denies any major changes in the urination, and remains on Flomax and finasteride.  He denies any nocturia.  PVR today normal at .  I personally viewed and interpreted the KUB today that shows a stable 1 cm right lower pole stone.  In terms of his ED, previously tried Cialis and sildenafil with minimal improvement and very bothersome side effects.  We have discussed other options like penile injections or vacuum erection device or penile prosthesis.  He may consider a vacuum erection device, but not interested in further treatment at this time in terms of penile injections or referral to consider penile prosthesis.  Continue maximal medical therapy for BPH with Flomax and finasteride RTC 1 year KUB, PVR, sooner if problems  Sondra Come, MD  [facility] [address], [address] [place], [place] [address] [phone]

## 2023-11-29 ENCOUNTER — Telehealth: Payer: Self-pay | Admitting: Nurse Practitioner

## 2023-11-29 NOTE — Telephone Encounter (Signed)
 Copied from CRM [id number]. Topic: Medicare AWV >> Nov 29, 2023 11:32 AM Nathanel DEL wrote: Reason for CRM: Called LVM 11/29/2023 to schedule AWV. Please schedule office or virtual visits.  Nathanel Paschal; Care Guide Ambulatory Clinical Support [place] [facility] Direct Dial: [phone]

## 2023-12-06 ENCOUNTER — Encounter: Payer: Medicare Other | Admitting: Nurse Practitioner

## 2023-12-06 DIAGNOSIS — N1832 Chronic kidney disease, stage 3b: Secondary | ICD-10-CM

## 2023-12-06 DIAGNOSIS — E1159 Type 2 diabetes mellitus with other circulatory complications: Secondary | ICD-10-CM

## 2023-12-06 DIAGNOSIS — E1169 Type 2 diabetes mellitus with other specified complication: Secondary | ICD-10-CM

## 2023-12-06 DIAGNOSIS — G4733 Obstructive sleep apnea (adult) (pediatric): Secondary | ICD-10-CM

## 2023-12-06 DIAGNOSIS — Z0000 Encounter for general adult medical examination without abnormal findings: Secondary | ICD-10-CM

## 2023-12-06 DIAGNOSIS — K219 Gastro-esophageal reflux disease without esophagitis: Secondary | ICD-10-CM

## 2023-12-06 DIAGNOSIS — N401 Enlarged prostate with lower urinary tract symptoms: Secondary | ICD-10-CM

## 2023-12-06 DIAGNOSIS — Z23 Encounter for immunization: Secondary | ICD-10-CM

## 2023-12-06 DIAGNOSIS — N2581 Secondary hyperparathyroidism of renal origin: Secondary | ICD-10-CM

## 2023-12-06 DIAGNOSIS — E6609 Other obesity due to excess calories: Secondary | ICD-10-CM

## 2023-12-06 DIAGNOSIS — K862 Cyst of pancreas: Secondary | ICD-10-CM

## 2023-12-06 DIAGNOSIS — E1140 Type 2 diabetes mellitus with diabetic neuropathy, unspecified: Secondary | ICD-10-CM

## 2023-12-06 DIAGNOSIS — E538 Deficiency of other specified B group vitamins: Secondary | ICD-10-CM

## 2023-12-26 ENCOUNTER — Other Ambulatory Visit: Payer: Self-pay | Admitting: Nurse Practitioner

## 2023-12-27 NOTE — Telephone Encounter (Signed)
Requested medication (s) are due for refill today: yes  Requested medication (s) are on the active medication list: yes  Last refill:  11/29/22  Future visit scheduled: yes  Notes to clinic:  Patient not taking: Reported on 08/27/2023. Routing for review.     Requested Prescriptions  Pending Prescriptions Disp Refills   omeprazole (PRILOSEC) 20 MG capsule [Pharmacy Med Name: OMEPRAZOLE DR 20 MG CAPSULE] 90 capsule 4    Sig: TAKE 1 CAPSULE BY MOUTH EVERY DAY     Gastroenterology: Proton Pump Inhibitors Passed - 12/27/2023  8:50 AM      Passed - Valid encounter within last 12 months    Recent Outpatient Visits           7 months ago Controlled type 2 diabetes mellitus with diabetic neuropathy, without long-term current use of insulin (HCC)   [place] [facility] [place], Corrie Dandy T, NP   1 year ago Medicare annual wellness visit, subsequent   [place] [facility] [place], Corrie Dandy T, NP   1 year ago Subacute cough   [place] [facility] Gabriel Cirri, NP   2 years ago Need for influenza vaccination   [place] [facility] Vigg, Avanti, MD   2 years ago Hyperlipidemia associated with type 2 diabetes mellitus (HCC)   [place] [facility] Vigg, Avanti, MD       Future Appointments             In 1 month Cannady, Dorie Rank, NP [facility], [facility]   In 8 months [place], Laurette Schimke, MD [facility] [place]

## 2023-12-30 ENCOUNTER — Encounter: Payer: Self-pay | Admitting: Nurse Practitioner

## 2024-01-23 ENCOUNTER — Other Ambulatory Visit: Payer: Self-pay | Admitting: Nurse Practitioner

## 2024-01-23 DIAGNOSIS — N1831 Chronic kidney disease, stage 3a: Secondary | ICD-10-CM

## 2024-01-23 DIAGNOSIS — I152 Hypertension secondary to endocrine disorders: Secondary | ICD-10-CM

## 2024-01-23 NOTE — Telephone Encounter (Signed)
 Requested medication (s) are due for refill today:   Yes  Requested medication (s) are on the active medication list:   Yes  Future visit scheduled:   Yes 3/19   Last ordered: 11/07/2022 #90, 4 refills  Provider to review for refills because labs are due and has an upcoming appt.   Requested Prescriptions  Pending Prescriptions Disp Refills   lisinopril (ZESTRIL) 10 MG tablet [Pharmacy Med Name: LISINOPRIL 10 MG TABLET] 90 tablet 4    Sig: TAKE 1 TABLET BY MOUTH EVERY DAY     Cardiovascular:  ACE Inhibitors Failed - 01/23/2024  3:48 PM      Failed - Cr in normal range and within 180 days    Creatinine  Date Value Ref Range Status  05/01/2012 1.81 (H) 0.60 - 1.30 mg/dL Final   Creatinine, Ser  Date Value Ref Range Status  11/29/2022 2.13 (H) 0.76 - 1.27 mg/dL Final         Failed - K in normal range and within 180 days    Potassium  Date Value Ref Range Status  11/29/2022 4.4 3.5 - 5.2 mmol/L Final  05/01/2012 4.8 3.5 - 5.1 mmol/L Final         Failed - Valid encounter within last 6 months    Recent Outpatient Visits           7 months ago Controlled type 2 diabetes mellitus with diabetic neuropathy, without long-term current use of insulin (HCC)   [place] [facility] [place], Corrie Dandy T, NP   1 year ago Medicare annual wellness visit, subsequent   [place] [facility] [place], Corrie Dandy T, NP   1 year ago Subacute cough   [place] [facility] Gabriel Cirri, NP   2 years ago Need for influenza vaccination   [place] [facility] Vigg, Avanti, MD   2 years ago Hyperlipidemia associated with type 2 diabetes mellitus (HCC)   [place] [facility] Vigg, Avanti, MD       Future Appointments             In 2 weeks Cannady, Dorie Rank, NP [place] [facility], [facility]   In 7 months Richardo Hanks, Laurette Schimke, MD [facility] [place]            Passed - Patient is not pregnant       Passed - Last BP in normal range    BP Readings from Last 1 Encounters:  08/27/23 120/70

## 2024-01-24 ENCOUNTER — Other Ambulatory Visit: Payer: Self-pay | Admitting: Nurse Practitioner

## 2024-01-24 DIAGNOSIS — N400 Enlarged prostate without lower urinary tract symptoms: Secondary | ICD-10-CM

## 2024-01-27 NOTE — Telephone Encounter (Signed)
 Requested medications are due for refill today.  yes  Requested medications are on the active medications list.  yes  Last refill. 11/29/2022 #90 4 rf  Future visit scheduled.   yes  Notes to clinic.  Labs are expired.    Requested Prescriptions  Pending Prescriptions Disp Refills   tamsulosin (FLOMAX) 0.4 MG CAPS capsule [Pharmacy Med Name: TAMSULOSIN HCL 0.4 MG CAPSULE] 90 capsule 4    Sig: TAKE 1 CAPSULE BY MOUTH EVERY DAY     Urology: Alpha-Adrenergic Blocker Failed - 01/27/2024  7:37 AM      Failed - PSA in normal range and within 360 days    Prostate Specific Ag, Serum  Date Value Ref Range Status  11/14/2021 0.3 0.0 - 4.0 ng/mL Final    Comment:    Roche ECLIA methodology. According to the American Urological Association, Serum PSA should decrease and remain at undetectable levels after radical prostatectomy. The AUA defines biochemical recurrence as an initial PSA value 0.2 ng/mL or greater followed by a subsequent confirmatory PSA value 0.2 ng/mL or greater. Values obtained with different assay methods or kits cannot be used interchangeably. Results cannot be interpreted as absolute evidence of the presence or absence of malignant disease.          Passed - Last BP in normal range    BP Readings from Last 1 Encounters:  08/27/23 120/70         Passed - Valid encounter within last 12 months    Recent Outpatient Visits           8 months ago Controlled type 2 diabetes mellitus with diabetic neuropathy, without long-term current use of insulin (HCC)   [place] [facility] [place], Corrie Dandy T, NP   1 year ago Medicare annual wellness visit, subsequent   [place] [facility] [place], Corrie Dandy T, NP   1 year ago Subacute cough   [place] [facility] Gabriel Cirri, NP   2 years ago Need for influenza vaccination   [place] [facility] Vigg, Avanti, MD   2 years ago Hyperlipidemia associated with type 2  diabetes mellitus (HCC)   [place] [facility] Vigg, Avanti, MD       Future Appointments             In 2 weeks Cannady, Dorie Rank, NP [place] [facility], [facility]   In 7 months Richardo Hanks, Laurette Schimke, MD [facility] [place]

## 2024-02-07 ENCOUNTER — Other Ambulatory Visit: Payer: Self-pay | Admitting: Nurse Practitioner

## 2024-02-07 DIAGNOSIS — E1169 Type 2 diabetes mellitus with other specified complication: Secondary | ICD-10-CM

## 2024-02-07 NOTE — Telephone Encounter (Signed)
 Requested Prescriptions  Pending Prescriptions Disp Refills   atorvastatin (LIPITOR) 20 MG tablet [Pharmacy Med Name: ATORVASTATIN 20 MG TABLET] 90 tablet 1    Sig: TAKE 1 TABLET BY MOUTH EVERY DAY     Cardiovascular:  Antilipid - Statins Failed - 02/07/2024  2:19 PM      Failed - Lipid Panel in normal range within the last 12 months    Cholesterol, Total  Date Value Ref Range Status  05/31/2023 119 100 - 199 mg/dL Final   Cholesterol Piccolo, Waived  Date Value Ref Range Status  06/19/2018 128 <200 mg/dL Final    Comment:                            Desirable                <200                         Borderline High      200- 239                         High                     >239    LDL Chol Calc (NIH)  Date Value Ref Range Status  05/31/2023 64 0 - 99 mg/dL Final   HDL  Date Value Ref Range Status  05/31/2023 32 (L) >39 mg/dL Final   Triglycerides  Date Value Ref Range Status  05/31/2023 130 0 - 149 mg/dL Final   Triglycerides Piccolo,Waived  Date Value Ref Range Status  06/19/2018 163 (H) <150 mg/dL Final    Comment:                            Normal                   <150                         Borderline High     150 - 199                         High                200 - 499                         Very High                >499          Passed - Patient is not pregnant      Passed - Valid encounter within last 12 months    Recent Outpatient Visits           8 months ago Controlled type 2 diabetes mellitus with diabetic neuropathy, without long-term current use of insulin (HCC)   [place] [facility] [place], Rex T, NP   1 year ago Medicare annual wellness visit, subsequent   [place] [facility] [place], Corrie Dandy T, NP   1 year ago Subacute cough   [place] [facility] Gabriel Cirri, NP   2 years ago Need for influenza vaccination   [place] [facility] Loura Pardon, MD  2 years ago  Hyperlipidemia associated with type 2 diabetes mellitus (HCC)   [place] [facility] Vigg, Avanti, MD       Future Appointments             In 5 days Cannady, Dorie Rank, NP [place] [facility], [facility]   In 6 months Richardo Hanks, Laurette Schimke, MD [facility] [place]

## 2024-02-09 DIAGNOSIS — E119 Type 2 diabetes mellitus without complications: Secondary | ICD-10-CM | POA: Insufficient documentation

## 2024-02-09 NOTE — Patient Instructions (Incomplete)
 Be Involved in Caring For Your Health:  Taking Medications When medications are taken as directed, they can greatly improve your health. But if they are not taken as prescribed, they may not work. In some cases, not taking them correctly can be harmful. To help ensure your treatment remains effective and safe, understand your medications and how to take them. Bring your medications to each visit for review by your provider.  Your lab results, notes, and after visit summary will be available on My Chart. We strongly encourage you to use this feature. If lab results are abnormal the clinic will contact you with the appropriate steps. If the clinic does not contact you assume the results are satisfactory. You can always view your results on My Chart. If you have questions regarding your health or results, please contact the clinic during office hours. You can also ask questions on My Chart.  We at [facility] are grateful that you chose [place] to provide your care. We strive to provide evidence-based and compassionate care and are always looking for feedback. If you get a survey from the clinic please complete this so we can hear your opinions.  Diabetes Mellitus and Foot Care Diabetes, also called diabetes mellitus, may cause problems with your feet and legs because of poor blood flow (circulation). Poor circulation may make your skin: Become thinner and drier. Break more easily. Heal more slowly. Peel and crack. You may also have nerve damage (neuropathy). This can cause decreased feeling in your legs and feet. This means that you may not notice minor injuries to your feet that could lead to more serious problems. Finding and treating problems early is the best way to prevent future foot problems. How to care for your feet Foot hygiene  Wash your feet daily with warm water and mild soap. Do not use hot water. Then, pat your feet and the areas between your toes until they are fully dry. Do  not soak your feet. This can dry your skin. Trim your toenails straight across. Do not dig under them or around the cuticle. File the edges of your nails with an emery board or nail file. Apply a moisturizing lotion or petroleum jelly to the skin on your feet and to dry, brittle toenails. Use lotion that does not contain alcohol and is unscented. Do not apply lotion between your toes. Shoes and socks Wear clean socks or stockings every day. Make sure they are not too tight. Do not wear knee-high stockings. These may decrease blood flow to your legs. Wear shoes that fit well and have enough cushioning. Always look in your shoes before you put them on to be sure there are no objects inside. To break in new shoes, wear them for just a few hours a day. This prevents injuries on your feet. Wounds, scrapes, corns, and calluses  Check your feet daily for blisters, cuts, bruises, sores, and redness. If you cannot see the bottom of your feet, use a mirror or ask someone for help. Do not cut off corns or calluses or try to remove them with medicine. If you find a minor scrape, cut, or break in the skin on your feet, keep it and the skin around it clean and dry. You may clean these areas with mild soap and water. Do not clean the area with peroxide, alcohol, or iodine. If you have a wound, scrape, corn, or callus on your foot, look at it several times a day to make sure it  is healing and not infected. Check for: Redness, swelling, or pain. Fluid or blood. Warmth. Pus or a bad smell. General tips Do not cross your legs. This may decrease blood flow to your feet. Do not use heating pads or hot water bottles on your feet. They may burn your skin. If you have lost feeling in your feet or legs, you may not know this is happening until it is too late. Protect your feet from hot and cold by wearing shoes, such as at the beach or on hot pavement. Schedule a complete foot exam at least once a year or more often if  you have foot problems. Report any cuts, sores, or bruises to your health care provider right away. Where to find more information American Diabetes Association: diabetes.org Association of Diabetes Care & Education Specialists: diabeteseducator.org Contact a health care provider if: You have a condition that increases your risk of infection, and you have any cuts, sores, or bruises on your feet. You have an injury that is not healing. You have redness on your legs or feet. You feel burning or tingling in your legs or feet. You have pain or cramps in your legs and feet. Your legs or feet are numb. Your feet always feel cold. You have pain around any toenails. Get help right away if: You have a wound, scrape, corn, or callus on your foot and: You have signs of infection. You have a fever. You have a red line going up your leg. This information is not intended to replace advice given to you by your health care provider. Make sure you discuss any questions you have with your health care provider. Document Revised: 05/16/2022 Document Reviewed: 05/16/2022 Elsevier Patient Education  2024 [employer].

## 2024-02-12 ENCOUNTER — Ambulatory Visit (INDEPENDENT_AMBULATORY_CARE_PROVIDER_SITE_OTHER): Payer: Medicare Other | Admitting: Nurse Practitioner

## 2024-02-12 ENCOUNTER — Encounter: Payer: Self-pay | Admitting: Nurse Practitioner

## 2024-02-12 VITALS — BP 128/65 | HR 73 | Temp 98.1°F | Ht 65.7 in | Wt 201.4 lb

## 2024-02-12 DIAGNOSIS — I152 Hypertension secondary to endocrine disorders: Secondary | ICD-10-CM

## 2024-02-12 DIAGNOSIS — E119 Type 2 diabetes mellitus without complications: Secondary | ICD-10-CM | POA: Diagnosis not present

## 2024-02-12 DIAGNOSIS — Z7984 Long term (current) use of oral hypoglycemic drugs: Secondary | ICD-10-CM

## 2024-02-12 DIAGNOSIS — N2581 Secondary hyperparathyroidism of renal origin: Secondary | ICD-10-CM

## 2024-02-12 DIAGNOSIS — Z6833 Body mass index (BMI) 33.0-33.9, adult: Secondary | ICD-10-CM | POA: Diagnosis not present

## 2024-02-12 DIAGNOSIS — E66811 Obesity, class 1: Secondary | ICD-10-CM | POA: Diagnosis not present

## 2024-02-12 DIAGNOSIS — E6609 Other obesity due to excess calories: Secondary | ICD-10-CM

## 2024-02-12 DIAGNOSIS — E1169 Type 2 diabetes mellitus with other specified complication: Secondary | ICD-10-CM

## 2024-02-12 DIAGNOSIS — N1832 Chronic kidney disease, stage 3b: Secondary | ICD-10-CM

## 2024-02-12 DIAGNOSIS — Z0000 Encounter for general adult medical examination without abnormal findings: Secondary | ICD-10-CM

## 2024-02-12 DIAGNOSIS — E1140 Type 2 diabetes mellitus with diabetic neuropathy, unspecified: Secondary | ICD-10-CM | POA: Diagnosis not present

## 2024-02-12 DIAGNOSIS — E538 Deficiency of other specified B group vitamins: Secondary | ICD-10-CM

## 2024-02-12 DIAGNOSIS — G4733 Obstructive sleep apnea (adult) (pediatric): Secondary | ICD-10-CM

## 2024-02-12 DIAGNOSIS — K5900 Constipation, unspecified: Secondary | ICD-10-CM | POA: Insufficient documentation

## 2024-02-12 DIAGNOSIS — E1159 Type 2 diabetes mellitus with other circulatory complications: Secondary | ICD-10-CM

## 2024-02-12 DIAGNOSIS — N401 Enlarged prostate with lower urinary tract symptoms: Secondary | ICD-10-CM

## 2024-02-12 DIAGNOSIS — K5901 Slow transit constipation: Secondary | ICD-10-CM

## 2024-02-12 DIAGNOSIS — K219 Gastro-esophageal reflux disease without esophagitis: Secondary | ICD-10-CM

## 2024-02-12 DIAGNOSIS — K862 Cyst of pancreas: Secondary | ICD-10-CM

## 2024-02-12 LAB — BAYER DCA HB A1C WAIVED: HB A1C (BAYER DCA - WAIVED): 6.4 % — ABNORMAL HIGH (ref 4.8–5.6)

## 2024-02-12 LAB — MICROALBUMIN, URINE WAIVED
Creatinine, Urine Waived: 200 mg/dL (ref 10–300)
Microalb, Ur Waived: 80 mg/L — ABNORMAL HIGH (ref 0–19)

## 2024-02-12 MED ORDER — GLIMEPIRIDE 2 MG PO TABS: ORAL_TABLET | ORAL | 4 refills | Status: AC

## 2024-02-12 MED ORDER — FINASTERIDE 5 MG PO TABS: 5.0000 mg | ORAL_TABLET | Freq: Every day | ORAL | 4 refills | Status: DC

## 2024-02-12 MED ORDER — ATORVASTATIN CALCIUM 20 MG PO TABS: 20.0000 mg | ORAL_TABLET | Freq: Every day | ORAL | 4 refills | Status: DC

## 2024-02-12 NOTE — Assessment & Plan Note (Signed)
Chronic, stable on current regimen.  Followed by urology, continue this collaboration, recent notes reviewed.

## 2024-02-12 NOTE — Progress Notes (Signed)
 BP 128/65 (BP Location: Left Arm, Cuff Size: Normal)   Pulse 73   Temp 98.1 F (36.7 C) (Oral)   Ht 5' 5.7" (1.669 m)   Wt 201 lb 6.4 oz (91.4 kg)   SpO2 98%   BMI 32.80 kg/m    Subjective:    Patient ID: Thomas Huang, male    DOB: 11-Jan-1951, 73 y.o.   MRN: [id number]  HPI: Thomas Huang is a 73 y.o. male presenting on 02/12/2024 for annual physical. Current medical complaints include: constipation  He currently lives with: wife Interim Problems from his last visit: no  Has had a dull pain to lower left quadrant abdomen.  Been present for 6 months.  Currently is constant, notices it most in the morning before getting out of bed and then goes to bathroom and improves/or goes away.  Denies N&V, diarrhea, fever.  Has been more constipated than usual.  Has a BM daily.  Does strain when passes bowels. Takes 1/2 dose Miralax daily, has tried full dose and this helps a little.  Probably does not drink enough fluid or get enough fiber. Tried Metamucil capsules and this gave him indigestion.    DIABETES Last A1c was 6.6% in July.  Takes Glimepiride 2 MG BID, at times only takes one dose due to sugar levels.  Takes Alpha Lipoic Acid + B12 for left foot neuropathy. Hypoglycemic episodes:no Polydipsia/polyuria: no Visual disturbance: no Chest pain: no Paresthesias: no Glucose Monitoring: yes             Accucheck frequency: a few times a week             Fasting glucose: 120 range             Post prandial: last night 159             Evening:             Before meals:  Taking Insulin?: no             Long acting insulin:             Short acting insulin: Blood Pressure Monitoring: daily Retinal Examination: Not Up to Date -- recommend he obtain Foot Exam: Up to Date Pneumovax: Up to Date Influenza: Up to Date Aspirin: no    HYPERTENSION / HYPERLIPIDEMIA Takes Lisinopril and Atorvastatin.  Has tried higher doses of statin and different statins in past with joint pain, so only  takes 1/2 a pill = 10 MG.  Uses CPAP 100% at home for OSA. Last echo was 03/28/21 with EF >55%.  Saw cardiology in 2017 and has not returned, heart murmur is present.   Satisfied with current treatment? yes Duration of hypertension: chronic BP monitoring frequency: occasional BP range: < 130/80 on average BP medication side effects: no Duration of hyperlipidemia: chronic Cholesterol medication side effects: no Cholesterol supplements: none Medication compliance: good compliance Aspirin: no Recent stressors: no Recurrent headaches: no Visual changes: no Palpitations: no Dyspnea: no Chest pain: no Lower extremity edema: no Dizzy/lightheaded: no    CHRONIC KIDNEY DISEASE Follows with nephrology, last 05/22/23.  Goes once a year to see them.   CKD status: stable Medications renally dose: yes Previous renal evaluation: yes Pneumovax:  Up to Date Influenza Vaccine:  Up to Date   GERD Taking Prilosec. They follow for pancreas cyst, repeat imagine done annually by GI.  Last saw GI 06/26/23. GERD control status: stable  Satisfied with current treatment? yes Heartburn  frequency: intermittent Medication side effects: no  Medication compliance: stable Previous GERD medications: Prilosec Antacid use frequency:  none Dysphagia: no Odynophagia:  no Hematemesis: no Blood in stool: no EGD: no  BPH Takes Flomax 0.4 MG daily.  Follows with urology with last visit 08/27/23. BPH status: stable Satisfied with current treatment?: yes Medication side effects: no Medication compliance: good compliance Duration: chronic Nocturia: no Urinary frequency:no Incomplete voiding: no Urgency: yes Weak urinary stream: yes Straining to start stream: no Dysuria: no  Functional Status Survey: Is the patient deaf or have difficulty hearing?: No Does the patient have difficulty seeing, even when wearing glasses/contacts?: No Does the patient have difficulty concentrating, remembering, or making  decisions?: No Does the patient have difficulty walking or climbing stairs?: No Does the patient have difficulty dressing or bathing?: No Does the patient have difficulty doing errands alone such as visiting a doctor's office or shopping?: No  FALL RISK:    02/12/2024    8:58 AM 05/31/2023    8:03 AM 11/29/2022    8:29 AM 05/22/2022    8:55 AM 11/21/2021    9:01 AM  Fall Risk   Falls in the past year? 0 0 0 0 0  Number falls in past yr: 0 0 0 0 0  Injury with Fall? 0 0 0 0 0  Risk for fall due to : No Fall Risks No Fall Risks No Fall Risks No Fall Risks No Fall Risks  Follow up Falls evaluation completed Falls evaluation completed Education provided Falls evaluation completed Falls evaluation completed       02/12/2024    8:58 AM 05/31/2023    8:03 AM 11/29/2022    8:17 AM 05/22/2022    8:56 AM 11/21/2021    9:01 AM  Depression screen PHQ 2/9  Decreased Interest 0 0 0 0 0  Down, Depressed, Hopeless 0 0 0 0 0  PHQ - 2 Score 0 0 0 0 0  Altered sleeping 0 0 0 0 0  Tired, decreased energy 0 0 0 0 0  Change in appetite 0 0 0 0 0  Feeling bad or failure about yourself  0 0 0 0 0  Trouble concentrating 0 0 0 0 0  Moving slowly or fidgety/restless 0 0 0 0 0  Suicidal thoughts 0 0 0 0 0  PHQ-9 Score 0 0 0 0 0  Difficult doing work/chores Not difficult at all Not difficult at all Not difficult at all Not difficult at all Not difficult at all       02/12/2024    8:58 AM 05/31/2023    8:04 AM 11/29/2022    8:17 AM 05/22/2022    8:56 AM  GAD 7 : Generalized Anxiety Score  Nervous, Anxious, on Edge 0 0 0 0  Control/stop worrying 0 0 0 0  Worry too much - different things 0 0 0 0  Trouble relaxing 0 0 0 0  Restless 0 0 0 0  Easily annoyed or irritable 0 0 0 0  Afraid - awful might happen 0 0 0 0  Total GAD 7 Score 0 0 0 0  Anxiety Difficulty Not difficult at all Not difficult at all Not difficult at all Not difficult at all   Past Medical History:  Past Medical History:  Diagnosis Date    Arthritis    lower back   Carpal tunnel syndrome of left wrist    Chronic kidney disease    Diabetes mellitus, type 2 (HCC)  GERD (gastroesophageal reflux disease)    Heart murmur    followed by PCP   Hyperlipidemia    Kidney stone    Sleep apnea    CPAP   Vertigo    mild. several episodes/wk    Surgical History:  Past Surgical History:  Procedure Laterality Date   CARPAL TUNNEL RELEASE     CLOSED REDUCTION NASAL FRACTURE N/A 11/05/2017   Procedure: CLOSED REDUCTION NASAL FRACTURE WITH NASAL TURBINATE COBLATION;  Surgeon: Geanie Logan, MD;  Location: [facility];  Service: ENT;  Laterality: N/A;  Diabetic - oral meds   LAMINECTOMY     lumbar, [facility]   recurrent kidney stones      Medications:  Current Outpatient Medications on File Prior to Visit  Medication Sig   Alpha-Lipoic Acid 200 MG TABS Take 1 tablet by mouth daily at 2 PM.   cyanocobalamin (VITAMIN B12) 1000 MCG tablet Take 1,000 mcg by mouth daily.   fexofenadine-pseudoephedrine (ALLEGRA-D 24) 180-240 MG 24 hr tablet Take 1 tablet by mouth daily as needed.   fluticasone (FLONASE) 50 MCG/ACT nasal spray Place 1 spray into both nostrils as needed for allergies or rhinitis.   glucose blood test strip 1 each by Other route as needed for other. Use as instructed   lisinopril (ZESTRIL) 10 MG tablet TAKE 1 TABLET BY MOUTH EVERY DAY   omeprazole (PRILOSEC) 20 MG capsule TAKE 1 CAPSULE BY MOUTH EVERY DAY   tamsulosin (FLOMAX) 0.4 MG CAPS capsule TAKE 1 CAPSULE BY MOUTH EVERY DAY   No current facility-administered medications on file prior to visit.    Allergies:  Allergies  Allergen Reactions   Ergocalciferol Other (See Comments)    Insomnia   Nsaids     Avoids because of kidney issues   Other    Pollen Extract Other (See Comments)    Sneezing, watery eyes, sinus drainage   Shellfish Allergy Other (See Comments)    Cannot have IVP due. Pt has renal failure   Vitamin D Analogs Other (See Comments)     Insomnia    Social History:  Social History   Socioeconomic History   Marital status: Married    Spouse name: Not on file   Number of children: Not on file   Years of education: Not on file   Highest education level: Not on file  Occupational History   Not on file  Tobacco Use   Smoking status: Never    Passive exposure: Never   Smokeless tobacco: Never  Vaping Use   Vaping status: Never Used  Substance and Sexual Activity   Alcohol use: No    Alcohol/week: 0.0 standard drinks of alcohol   Drug use: No   Sexual activity: Yes  Other Topics Concern   Not on file  Social History Narrative   Not on file   Social Drivers of Health   Financial Resource Strain: Low Risk  (11/29/2022)   Overall Financial Resource Strain (CARDIA)    Difficulty of Paying Living Expenses: Not hard at all  Food Insecurity: No Food Insecurity (11/29/2022)   Hunger Vital Sign    Worried About Running Out of Food in the Last Year: Never true    Ran Out of Food in the Last Year: Never true  Transportation Needs: No Transportation Needs (11/29/2022)   PRAPARE - Administrator, Civil Service (Medical): No    Lack of Transportation (Non-Medical): No  Physical Activity: Insufficiently Active (11/29/2022)   Exercise Vital Sign  Days of Exercise per Week: 3 days    Minutes of Exercise per Session: 20 min  Stress: No Stress Concern Present (11/29/2022)   [employer] of Occupational Health - Occupational Stress Questionnaire    Feeling of Stress : Not at all  Social Connections: Socially Integrated (11/29/2022)   Social Connection and Isolation Panel [NHANES]    Frequency of Communication with Friends and Family: More than three times a week    Frequency of Social Gatherings with Friends and Family: More than three times a week    Attends Religious Services: More than 4 times per year    Active Member of [employer] or Organizations: No    Attends Engineer, structural: 1 to 4 times per year     Marital Status: Married  Catering manager Violence: Not At Risk (11/29/2022)   Humiliation, Afraid, Rape, and Kick questionnaire    Fear of Current or Ex-Partner: No    Emotionally Abused: No    Physically Abused: No    Sexually Abused: No   Social History   Tobacco Use  Smoking Status Never   Passive exposure: Never  Smokeless Tobacco Never   Social History   Substance and Sexual Activity  Alcohol Use No   Alcohol/week: 0.0 standard drinks of alcohol    Family History:  Family History  Problem Relation Age of Onset   Diabetes Mother    Heart disease Mother    Cancer Mother        breast and lung   Stroke Mother    Heart attack Mother    Cancer Father        pancreatic   Hypertension Brother    Hyperlipidemia Brother    Diabetes Brother    Diabetes Maternal Grandmother    Diabetes Paternal Grandmother     Past medical history, surgical history, medications, allergies, family history and social history reviewed with patient today and changes made to appropriate areas of the chart.   ROS All other ROS negative except what is listed above and in the HPI.      Objective:    BP 128/65 (BP Location: Left Arm, Cuff Size: Normal)   Pulse 73   Temp 98.1 F (36.7 C) (Oral)   Ht 5' 5.7" (1.669 m)   Wt 201 lb 6.4 oz (91.4 kg)   SpO2 98%   BMI 32.80 kg/m   Wt Readings from Last 3 Encounters:  02/12/24 201 lb 6.4 oz (91.4 kg)  08/27/23 203 lb 9.6 oz (92.4 kg)  05/31/23 204 lb (92.5 kg)    Physical Exam Vitals and nursing note reviewed.  Constitutional:      General: He is awake. He is not in acute distress.    Appearance: He is well-developed and well-groomed. He is obese. He is not ill-appearing or toxic-appearing.  HENT:     Head: Normocephalic and atraumatic.     Right Ear: Hearing normal. No drainage.     Left Ear: Hearing normal. No drainage.  Eyes:     General: Lids are normal.        Right eye: No discharge.        Left eye: No discharge.      Conjunctiva/sclera: Conjunctivae normal.     Pupils: Pupils are equal, round, and reactive to light.  Neck:     Vascular: No carotid bruit.  Cardiovascular:     Rate and Rhythm: Normal rate and regular rhythm.     Heart sounds: S1 normal and  S2 normal. Murmur heard.     Systolic murmur is present with a grade of 2/6.     No gallop.  Pulmonary:     Effort: Pulmonary effort is normal. No accessory muscle usage or respiratory distress.     Breath sounds: Normal breath sounds.  Abdominal:     General: Bowel sounds are normal.     Palpations: Abdomen is soft.  Musculoskeletal:        General: Normal range of motion.     Cervical back: Normal range of motion and neck supple.     Right lower leg: No edema.     Left lower leg: No edema.  Skin:    General: Skin is warm and dry.     Capillary Refill: Capillary refill takes less than 2 seconds.  Neurological:     Mental Status: He is alert and oriented to person, place, and time.  Psychiatric:        Attention and Perception: Attention normal.        Mood and Affect: Mood normal.        Speech: Speech normal.        Behavior: Behavior normal. Behavior is cooperative.        Thought Content: Thought content normal.    Diabetic Foot Exam - Simple   Simple Foot Form Visual Inspection See comments: Yes Sensation Testing See comments: Yes Pulse Check Posterior Tibialis and Dorsalis pulse intact bilaterally: Yes Comments Dry skin and some calluses to both feet.  Sensation left 5/10 and right 6/10.        11/29/2022    8:44 AM  6CIT Screen  What Year? 0 points  What month? 0 points  What time? 0 points  Count back from 20 0 points  Months in reverse 0 points  Repeat phrase 0 points  Total Score 0 points   Results for orders placed or performed in visit on 08/27/23  Bladder Scan (Post Void Residual) in office   Collection Time: 08/27/23 10:05 AM  Result Value Ref Range   Scan Result       Assessment & Plan:    Problem List Items Addressed This Visit       Cardiovascular and Mediastinum   Hypertension associated with diabetes (HCC)   Chronic, stable.  BP at goal in office and at home.  Continue current medication regimen and adjust as needed.  Recommend checking BP at least three times a week at home.  Focus on DASH diet.  LABS: CBC, CMP, TS, urine ALB.  Urine ALB 80 March 2025.  Return in 6 months.      Relevant Medications   atorvastatin (LIPITOR) 20 MG tablet   glimepiride (AMARYL) 2 MG tablet   Other Relevant Orders   Bayer DCA Hb A1c Waived   Microalbumin, Urine Waived   CBC with Differential/Platelet   Comprehensive metabolic panel   TSH     Respiratory   OSA on CPAP   Chronic.  100% use of CPAP at home, continue adherent regimen.        Digestive   GERD (gastroesophageal reflux disease)   Chronic, stable with minimal Prilosec use.  Continue current regimen and check Mag level annually. Risks of PPI use were discussed with patient including bone loss, C. Diff diarrhea, pneumonia, infections, CKD, electrolyte abnormalities.  Verbalizes understanding and chooses to continue the medication.       Relevant Orders   Magnesium   Pancreatic cyst  Endocrine   Controlled type 2 diabetes mellitus with diabetic neuropathy, without long-term current use of insulin (HCC) - Primary   Chronic, ongoing with A1c 6.4% today, trend down with some weight loss.  Continue current medication regimen and adjust as needed, discussed with him to closely monitor BS with this and notify provider if consistent <70.  He wishes to focus on diet and exercise.  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  Monitor use of Glimepiride due to age and if BS consistently <70 consider alternate options.  Check BS at home as least three mornings a week.  Return in 6 months.  - Needs eye exam.  Foot exam up to date. - Statin and ACE on board. - Vaccines up to date.      Relevant Medications    atorvastatin (LIPITOR) 20 MG tablet   glimepiride (AMARYL) 2 MG tablet   Other Relevant Orders   Bayer DCA Hb A1c Waived   Microalbumin, Urine Waived   Diabetes mellitus treated with oral medication (HCC)   Chronic, ongoing with A1c 6.4% today, trend down with some weight loss.  Continue current medication regimen and adjust as needed, discussed with him to closely monitor BS with this and notify provider if consistent <70.  He wishes to focus on diet and exercise.  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  Monitor use of Glimepiride due to age and if BS consistently <70 consider alternate options.  Check BS at home as least three mornings a week.  Return in 6 months.  - Needs eye exam.  Foot exam up to date. - Statin and ACE on board. - Vaccines up to date.      Relevant Medications   atorvastatin (LIPITOR) 20 MG tablet   glimepiride (AMARYL) 2 MG tablet   Other Relevant Orders   Bayer DCA Hb A1c Waived   Hyperlipidemia associated with type 2 diabetes mellitus (HCC)   Chronic, ongoing.  Continue current medication regimen and adjust as needed.  Lipid panel today.      Relevant Medications   atorvastatin (LIPITOR) 20 MG tablet   glimepiride (AMARYL) 2 MG tablet   Other Relevant Orders   Bayer DCA Hb A1c Waived   Comprehensive metabolic panel   Lipid Panel w/o Chol/HDL Ratio   Hyperparathyroidism due to renal insufficiency (HCC)   Chronic, ongoing.  Followed by nephrology at [facility], recent notes and labs reviewed.        Genitourinary   BPH (benign prostatic hyperplasia)   Chronic, stable on current regimen.  Followed by urology, continue this collaboration, recent notes reviewed.      Relevant Medications   finasteride (PROSCAR) 5 MG tablet   CKD (chronic kidney disease), stage III (HCC)   Chronic, ongoing.  Monitored by nephrology at [facility], continue collaboration and Lisinopril at this time.  Recent notes reviewed.  Labs obtained today.      Relevant Orders    Microalbumin, Urine Waived   Comprehensive metabolic panel     Other   B12 deficiency   Chronic, stable.  Continue supplement and recheck level.      Relevant Orders   Vitamin B12   Constipation   Ongoing for 6 months.  Recommend taking full dose Miralax daily + adding on Senna S one to two tablets daily.  No Metamucil due to increased reflux with this.  Ensure plenty of water and fiber intake daily.  Return to office if ongoing or worsening.  Goal is  daily BM with no straining.      Obesity   BMI 32.80.  Recommended eating smaller high protein, low fat meals more frequently and exercising 30 mins a day 5 times a week with a goal of 10-15lb weight loss in the next 3 months. Patient voiced their understanding and motivation to adhere to these recommendations.       Relevant Medications   glimepiride (AMARYL) 2 MG tablet    Discussed aspirin prophylaxis for myocardial infarction prevention and decision was it was not indicated  LABORATORY TESTING:  Health maintenance labs ordered today as discussed above.   IMMUNIZATIONS:   - Tdap: Tetanus vaccination status reviewed: last tetanus booster within 10 years. - Influenza: Up to date - Pneumovax: Up to date - Prevnar: Up to date - Zostavax vaccine: Up to date  SCREENING: - Colonoscopy: Up to date  Discussed with patient purpose of the colonoscopy is to detect colon cancer at curable precancerous or early stages   - AAA Screening: Not applicable  -Hearing Test: Not applicable  -Spirometry: Not applicable   PATIENT COUNSELING:    Sexuality: Discussed sexually transmitted diseases, partner selection, use of condoms, avoidance of unintended pregnancy  and contraceptive alternatives.   Advised to avoid cigarette smoking.  I discussed with the patient that most people either abstain from alcohol or drink within safe limits (<=14/week and <=4 drinks/occasion for males, <=7/weeks and <= 3 drinks/occasion for females) and that the  risk for alcohol disorders and other health effects rises proportionally with the number of drinks per week and how often a drinker exceeds daily limits.  Discussed cessation/primary prevention of drug use and availability of treatment for abuse.   Diet: Encouraged to adjust caloric intake to maintain  or achieve ideal body weight, to reduce intake of dietary saturated fat and total fat, to limit sodium intake by avoiding high sodium foods and not adding table salt, and to maintain adequate dietary potassium and calcium preferably from fresh fruits, vegetables, and low-fat dairy products.    Stressed the importance of regular exercise  Injury prevention: Discussed safety belts, safety helmets, smoke detector, smoking near bedding or upholstery.   Dental health: Discussed importance of regular tooth brushing, flossing, and dental visits.   Follow up plan: NEXT PREVENTATIVE PHYSICAL DUE IN 1 YEAR. Return in about 6 months (around 08/14/2024) for T2DM, HTN/HLD, CKD + needs Medicare Wellness scheduled with nurse.

## 2024-02-12 NOTE — Assessment & Plan Note (Signed)
 Ongoing for 6 months.  Recommend taking full dose Miralax daily + adding on Senna S one to two tablets daily.  No Metamucil due to increased reflux with this.  Ensure plenty of water and fiber intake daily.  Return to office if ongoing or worsening.  Goal is daily BM with no straining.

## 2024-02-12 NOTE — Assessment & Plan Note (Signed)
 Chronic, stable.  Continue supplement and recheck level.

## 2024-02-12 NOTE — Assessment & Plan Note (Signed)
 Chronic, ongoing with A1c 6.4% today, trend down with some weight loss.  Continue current medication regimen and adjust as needed, discussed with him to closely monitor BS with this and notify provider if consistent <70.  He wishes to focus on diet and exercise.  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  Monitor use of Glimepiride due to age and if BS consistently <70 consider alternate options.  Check BS at home as least three mornings a week.  Return in 6 months.  - Needs eye exam.  Foot exam up to date. - Statin and ACE on board. - Vaccines up to date.

## 2024-02-12 NOTE — Assessment & Plan Note (Signed)
Chronic, stable with minimal Prilosec use.  Continue current regimen and check Mag level annually. Risks of PPI use were discussed with patient including bone loss, C. Diff diarrhea, pneumonia, infections, CKD, electrolyte abnormalities.  Verbalizes understanding and chooses to continue the medication.

## 2024-02-12 NOTE — Assessment & Plan Note (Signed)
 BMI 32.80.  Recommended eating smaller high protein, low fat meals more frequently and exercising 30 mins a day 5 times a week with a goal of 10-15lb weight loss in the next 3 months. Patient voiced their understanding and motivation to adhere to these recommendations.

## 2024-02-12 NOTE — Assessment & Plan Note (Signed)
 Chronic, ongoing.  Monitored by nephrology at [facility], continue collaboration and Lisinopril at this time.  Recent notes reviewed.  Labs obtained today.

## 2024-02-12 NOTE — Assessment & Plan Note (Signed)
 Chronic, ongoing.  Continue current medication regimen and adjust as needed. Lipid panel today.

## 2024-02-12 NOTE — Assessment & Plan Note (Signed)
 Chronic, stable.  BP at goal in office and at home.  Continue current medication regimen and adjust as needed.  Recommend checking BP at least three times a week at home.  Focus on DASH diet.  LABS: CBC, CMP, TS, urine ALB.  Urine ALB 80 March 2025.  Return in 6 months.

## 2024-02-12 NOTE — Assessment & Plan Note (Signed)
Chronic.  100% use of CPAP at home, continue adherent regimen. 

## 2024-02-12 NOTE — Assessment & Plan Note (Signed)
Chronic, ongoing.  Followed by nephrology at [facility], recent notes and labs reviewed. 

## 2024-02-13 ENCOUNTER — Encounter: Payer: Self-pay | Admitting: Nurse Practitioner

## 2024-02-13 NOTE — Progress Notes (Signed)
 Contacted via MyChart   Good afternoon [place], your labs have returned: - CBC is continuing to show some anemia, we will continue to monitor this along with kidney doctor.  If continues to drop we may need to check some other things.  Platelets very mildly low this check. - Kidney function continues to show Stage 3b kidney disease.  We will continue to watch levels - Remainder of labs stable.  Waiting on B12 and if abnormal will let you know.  Any questions? Keep being amazing!!  Thank you for allowing me to participate in your care.  I appreciate you. Kindest regards, Jasan Doughtie

## 2024-02-14 LAB — COMPREHENSIVE METABOLIC PANEL
ALT: 21 IU/L (ref 0–44)
AST: 20 IU/L (ref 0–40)
Albumin: 4.1 g/dL (ref 3.8–4.8)
Alkaline Phosphatase: 65 IU/L (ref 44–121)
BUN/Creatinine Ratio: 13 (ref 10–24)
BUN: 25 mg/dL (ref 8–27)
Bilirubin Total: 0.3 mg/dL (ref 0.0–1.2)
CO2: 19 mmol/L — ABNORMAL LOW (ref 20–29)
Calcium: 8.8 mg/dL (ref 8.6–10.2)
Chloride: 109 mmol/L — ABNORMAL HIGH (ref 96–106)
Creatinine, Ser: 1.87 mg/dL — ABNORMAL HIGH (ref 0.76–1.27)
Globulin, Total: 2.3 g/dL (ref 1.5–4.5)
Glucose: 110 mg/dL — ABNORMAL HIGH (ref 70–99)
Potassium: 4.5 mmol/L (ref 3.5–5.2)
Sodium: 142 mmol/L (ref 134–144)
Total Protein: 6.4 g/dL (ref 6.0–8.5)
eGFR: 38 mL/min/{1.73_m2} — ABNORMAL LOW (ref >59)

## 2024-02-14 LAB — CBC WITH DIFFERENTIAL/PLATELET
Basophils Absolute: 0.1 10*3/uL (ref 0.0–0.2)
Basos: 1 %
EOS (ABSOLUTE): 0.4 10*3/uL (ref 0.0–0.4)
Eos: 8 %
Hematocrit: 35.9 % — ABNORMAL LOW (ref 37.5–51.0)
Hemoglobin: 11.7 g/dL — ABNORMAL LOW (ref 13.0–17.7)
Immature Grans (Abs): 0 10*3/uL (ref 0.0–0.1)
Immature Granulocytes: 0 %
Lymphocytes Absolute: 1.1 10*3/uL (ref 0.7–3.1)
Lymphs: 21 %
MCH: 30.5 pg (ref 26.6–33.0)
MCHC: 32.6 g/dL (ref 31.5–35.7)
MCV: 94 fL (ref 79–97)
Monocytes Absolute: 0.5 10*3/uL (ref 0.1–0.9)
Monocytes: 9 %
Neutrophils Absolute: 3.2 10*3/uL (ref 1.4–7.0)
Neutrophils: 61 %
Platelets: 141 10*3/uL — ABNORMAL LOW (ref 150–450)
RBC: 3.83 x10E6/uL — ABNORMAL LOW (ref 4.14–5.80)
RDW: 13.7 % (ref 11.6–15.4)
WBC: 5.3 10*3/uL (ref 3.4–10.8)

## 2024-02-14 LAB — MAGNESIUM: Magnesium: 2.1 mg/dL (ref 1.6–2.3)

## 2024-02-14 LAB — LIPID PANEL W/O CHOL/HDL RATIO
Cholesterol, Total: 103 mg/dL (ref 100–199)
HDL: 35 mg/dL — ABNORMAL LOW (ref >39)
LDL Chol Calc (NIH): 53 mg/dL (ref 0–99)
Triglycerides: 69 mg/dL (ref 0–149)
VLDL Cholesterol Cal: 15 mg/dL (ref 5–40)

## 2024-02-14 LAB — VITAMIN B12: Vitamin B-12: 1297 pg/mL — ABNORMAL HIGH (ref 232–1245)

## 2024-02-14 LAB — TSH: TSH: 2.38 u[IU]/mL (ref 0.450–4.500)

## 2024-04-21 ENCOUNTER — Ambulatory Visit: Admitting: Emergency Medicine

## 2024-04-21 VITALS — Ht 66.5 in | Wt 200.0 lb

## 2024-04-21 DIAGNOSIS — Z0000 Encounter for general adult medical examination without abnormal findings: Secondary | ICD-10-CM | POA: Diagnosis not present

## 2024-04-21 NOTE — Patient Instructions (Signed)
 Thomas Huang , Thank you for taking time out of your busy schedule to complete your Annual Wellness Visit with me. I enjoyed our conversation and look forward to speaking with you again next year. I, as well as your care team,  appreciate your ongoing commitment to your health goals. Please review the following plan we discussed and let me know if I can assist you in the future. Your Game plan/ To Do List    Referrals: None   Follow up Visits: Next Medicare AWV with our clinical staff: 04/27/25 @ 11:20am (phone visit)   Have you seen your provider in the last 6 months (3 months if uncontrolled diabetes)? Yes Next Office Visit with your provider: 08/14/24 @ 8:20am with Thomas Cannady, NP  Clinician Recommendations: Call Dr. Sylvester Evert Nice's office @ [phone] to schedule a diabetic eye exam at your earliest convenience. Aim for 30 minutes of exercise or brisk walking, 6-8 glasses of water, and 5 servings of fruits and vegetables each day.       This is a list of the screening recommended for you and due dates:  Health Maintenance  Topic Date Due   Eye exam for diabetics  09/09/2019   COVID-19 Vaccine (3 - 2024-25 season) 07/28/2023   Flu Shot  06/26/2024   Hemoglobin A1C  08/14/2024   Yearly kidney function blood test for diabetes  02/11/2025   Yearly kidney health urinalysis for diabetes  02/11/2025   Complete foot exam   02/11/2025   Medicare Annual Wellness Visit  04/21/2025   Colon Cancer Screening  10/04/2025   DTaP/Tdap/Td vaccine (3 - Tdap) 10/05/2025   Pneumonia Vaccine  Completed   Hepatitis C Screening  Completed   Zoster (Shingles) Vaccine  Completed   HPV Vaccine  Aged Out   Meningitis B Vaccine  Aged Out    Advanced directives: (Copy Requested) Please bring a copy of your health care power of attorney and living will to the office to be added to your chart at your convenience. You can mail to [name], [name] or email to  [email or web address] Advance Care Planning is important because it:  [x]  Makes sure you receive the medical care that is consistent with your values, goals, and preferences  [x]  It provides guidance to your family and loved ones and reduces their decisional burden about whether or not they are making the right decisions based on your wishes.  Follow the link provided in your after visit summary or read over the paperwork we have mailed to you to help you started getting your Advance Directives in place. If you need assistance in completing these, please reach out to us  so that we can help you!  See attachments for Preventive Care and Fall Prevention Tips.   Fall Prevention in the Home, Adult Falls can cause injuries and affect people of all ages. There are many simple things that you can do to make your home safe and to help prevent falls. If you need it, ask for help making these changes. What actions can I take to prevent falls? General information Use good lighting in all rooms. Make sure to: Replace any light bulbs that burn out. Turn on lights if it is dark and use night-lights. Keep items that you use often in easy-to-reach places. Lower the shelves around your home if needed. Move furniture so that there are clear paths around it. Do not keep throw rugs or other things on the  floor that can make you trip. If any of your floors are uneven, fix them. Add color or contrast paint or tape to clearly mark and help you see: Grab bars or handrails. First and last steps of staircases. Where the edge of each step is. If you use a ladder or stepladder: Make sure that it is fully opened. Do not climb a closed ladder. Make sure the sides of the ladder are locked in place. Have someone hold the ladder while you use it. Know where your pets are as you move through your home. What can I do in the bathroom?     Keep the floor dry. Clean up any water that is on the floor right  away. Remove soap buildup in the bathtub or shower. Buildup makes bathtubs and showers slippery. Use non-skid mats or decals on the floor of the bathtub or shower. Attach bath mats securely with double-sided, non-slip rug tape. If you need to sit down while you are in the shower, use a non-slip stool. Install grab bars by the toilet and in the bathtub and shower. Do not use towel bars as grab bars. What can I do in the bedroom? Make sure that you have a light by your bed that is easy to reach. Do not use any sheets or blankets on your bed that hang to the floor. Have a firm bench or chair with side arms that you can use for support when you get dressed. What can I do in the kitchen? Clean up any spills right away. If you need to reach something above you, use a sturdy step stool that has a grab bar. Keep electrical cables out of the way. Do not use floor polish or wax that makes floors slippery. What can I do with my stairs? Do not leave anything on the stairs. Make sure that you have a light switch at the top and the bottom of the stairs. Have them installed if you do not have them. Make sure that there are handrails on both sides of the stairs. Fix handrails that are broken or loose. Make sure that handrails are as long as the staircases. Install non-slip stair treads on all stairs in your home if they do not have carpet. Avoid having throw rugs at the top or bottom of stairs, or secure the rugs with carpet tape to prevent them from moving. Choose a carpet design that does not hide the edge of steps on the stairs. Make sure that carpet is firmly attached to the stairs. Fix any carpet that is loose or worn. What can I do on the outside of my home? Use bright outdoor lighting. Repair the edges of walkways and driveways and fix any cracks. Clear paths of anything that can make you trip, such as tools or rocks. Add color or contrast paint or tape to clearly mark and help you see high doorway  thresholds. Trim any bushes or trees on the main path into your home. Check that handrails are securely fastened and in good repair. Both sides of all steps should have handrails. Install guardrails along the edges of any raised decks or porches. Have leaves, snow, and ice cleared regularly. Use sand, salt, or ice melt on walkways during winter months if you live where there is ice and snow. In the garage, clean up any spills right away, including grease or oil spills. What other actions can I take? Review your medicines with your health care provider. Some medicines can make you  confused or feel dizzy. This can increase your chance of falling. Wear closed-toe shoes that fit well and support your feet. Wear shoes that have rubber soles and low heels. Use a cane, walker, scooter, or crutches that help you move around if needed. Talk with your provider about other ways that you can decrease your risk of falls. This may include seeing a physical therapist to learn to do exercises to improve movement and strength. Where to find more information Centers for Disease Control and Prevention, STEADI: TonerPromos.no [employer] on Aging: BaseRingTones.pl National Institute on Aging: BaseRingTones.pl Contact a health care provider if: You are afraid of falling at home. You feel weak, drowsy, or dizzy at home. You fall at home. Get help right away if you: Lose consciousness or have trouble moving after a fall. Have a fall that causes a head injury. These symptoms may be an emergency. Get help right away. Call 911. Do not wait to see if the symptoms will go away. Do not drive yourself to the hospital. This information is not intended to replace advice given to you by your health care provider. Make sure you discuss any questions you have with your health care provider. Document Revised: 07/16/2022 Document Reviewed: 07/16/2022 Elsevier Patient Education  2024 [employer].

## 2024-04-21 NOTE — Progress Notes (Signed)
 Subjective:   Thomas Huang is a 73 y.o. who presents for a Medicare Wellness preventive visit.  As a reminder, Annual Wellness Visits don't include a physical exam, and some assessments may be limited, especially if this visit is performed virtually. We may recommend an in-person follow-up visit with your provider if needed.  Visit Complete: Virtual I connected with  [name] on 04/21/24 by a audio enabled telemedicine application and verified that I am speaking with the correct person using two identifiers.  Patient Location: Home  Provider Location: Office/Clinic  I discussed the limitations of evaluation and management by telemedicine. The patient expressed understanding and agreed to proceed.  Vital Signs: Because this visit was a virtual/telehealth visit, some criteria may be missing or patient reported. Any vitals not documented were not able to be obtained and vitals that have been documented are patient reported.  VideoDeclined- This patient declined Librarian, academic. Therefore the visit was completed with audio only.  Persons Participating in Visit: Patient.  AWV Questionnaire: No: Patient Medicare AWV questionnaire was not completed prior to this visit.  Cardiac Risk Factors include: advanced age (>61men, >31 women);male gender;hypertension;diabetes mellitus;dyslipidemia;obesity (BMI >30kg/m2);Other (see comment), Risk factor comments: OSA (cpap)     Objective:     Today's Vitals   04/21/24 1117  Weight: 200 lb (90.7 kg)  Height: 5' 6.5" (1.689 m)   Body mass index is 31.8 kg/m.     04/21/2024   11:28 AM 11/05/2017    8:55 AM 10/23/2017    8:30 PM  Advanced Directives  Does Patient Have a Medical Advance Directive? Yes Yes Yes  Type of Estate agent of [place];Living will Healthcare Power of [place];Living will Healthcare Power of [place];Living will  Does patient want to make changes to medical  advance directive? No - Patient declined    Copy of Healthcare Power of Attorney in Chart? No - copy requested No - copy requested No - copy requested    Current Medications (verified) Outpatient Encounter Medications as of 04/21/2024  Medication Sig   Alpha-Lipoic Acid 200 MG TABS Take 1 tablet by mouth daily at 2 PM.   atorvastatin  (LIPITOR) 20 MG tablet Take 1 tablet (20 mg total) by mouth daily.   cyanocobalamin (VITAMIN B12) 1000 MCG tablet Take 1,000 mcg by mouth daily.   fexofenadine -pseudoephedrine (ALLEGRA-D 24) 180-240 MG 24 hr tablet Take 1 tablet by mouth daily as needed.   finasteride  (PROSCAR ) 5 MG tablet Take 1 tablet (5 mg total) by mouth daily.   fluticasone  (FLONASE ) 50 MCG/ACT nasal spray Place 1 spray into both nostrils as needed for allergies or rhinitis.   glimepiride  (AMARYL ) 2 MG tablet TAKE 1 TABLET (2 MG TOTAL) BY MOUTH 2 (TWO) TIMES DAILY BEFORE A MEAL.   glucose blood test strip 1 each by Other route as needed for other. Use as instructed   lisinopril  (ZESTRIL ) 10 MG tablet TAKE 1 TABLET BY MOUTH EVERY DAY   omeprazole  (PRILOSEC) 20 MG capsule TAKE 1 CAPSULE BY MOUTH EVERY DAY   polyethylene glycol (MIRALAX / GLYCOLAX) 17 g packet Take 17 g by mouth daily.   tamsulosin  (FLOMAX ) 0.4 MG CAPS capsule TAKE 1 CAPSULE BY MOUTH EVERY DAY   No facility-administered encounter medications on file as of 04/21/2024.    Allergies (verified) Ergocalciferol, Nsaids, Other, Pollen extract, Shellfish allergy, and Vitamin d analogs   History: Past Medical History:  Diagnosis Date   Arthritis    lower back  Carpal tunnel syndrome of left wrist    Chronic kidney disease    Diabetes mellitus, type 2 (HCC)    GERD (gastroesophageal reflux disease)    Heart murmur    followed by PCP   Hyperlipidemia    Kidney stone    Sleep apnea    CPAP   Vertigo    mild. several episodes/wk   Past Surgical History:  Procedure Laterality Date   CARPAL TUNNEL RELEASE     CLOSED  REDUCTION NASAL FRACTURE N/A 11/05/2017   Procedure: CLOSED REDUCTION NASAL FRACTURE WITH NASAL TURBINATE COBLATION;  Surgeon: Von Grumbling, MD;  Location: [facility];  Service: ENT;  Laterality: N/A;  Diabetic - oral meds   LAMINECTOMY     lumbar, [facility]   recurrent kidney stones     Family History  Problem Relation Age of Onset   Diabetes Mother    Heart disease Mother    Cancer Mother        breast and lung   Stroke Mother    Heart attack Mother    Cancer Father        pancreatic   Hypertension Brother    Hyperlipidemia Brother    Diabetes Brother    Diabetes Maternal Grandmother    Diabetes Paternal Grandmother    Social History   Socioeconomic History   Marital status: Married    Spouse name: Tanya Fantasia   Number of children: 3   Years of education: Not on file   Highest education level: Not on file  Occupational History   Occupation: retired  Tobacco Use   Smoking status: Never    Passive exposure: Never   Smokeless tobacco: Never  Vaping Use   Vaping status: Never Used  Substance and Sexual Activity   Alcohol use: No    Alcohol/week: 0.0 standard drinks of alcohol   Drug use: No   Sexual activity: Yes  Other Topics Concern   Not on file  Social History Narrative   Not on file   Social Drivers of Health   Financial Resource Strain: Low Risk  (04/21/2024)   Overall Financial Resource Strain (CARDIA)    Difficulty of Paying Living Expenses: Not hard at all  Food Insecurity: No Food Insecurity (04/21/2024)   Hunger Vital Sign    Worried About Running Out of Food in the Last Year: Never true    Ran Out of Food in the Last Year: Never true  Transportation Needs: No Transportation Needs (04/21/2024)   PRAPARE - Administrator, Civil Service (Medical): No    Lack of Transportation (Non-Medical): No  Physical Activity: Inactive (04/21/2024)   Exercise Vital Sign    Days of Exercise per Week: 0 days    Minutes of Exercise per Session: 0 min   Stress: No Stress Concern Present (04/21/2024)   [employer] of Occupational Health - Occupational Stress Questionnaire    Feeling of Stress : Not at all  Social Connections: Socially Integrated (04/21/2024)   Social Connection and Isolation Panel [NHANES]    Frequency of Communication with Friends and Family: More than three times a week    Frequency of Social Gatherings with Friends and Family: More than three times a week    Attends Religious Services: More than 4 times per year    Active Member of [employer] or Organizations: Yes    Attends Engineer, structural: More than 4 times per year    Marital Status: Married    Tobacco Counseling  Counseling given: No    Clinical Intake:  Pre-visit preparation completed: Yes  Pain : No/denies pain     BMI - recorded: 31.8 Nutritional Status: BMI > 30  Obese Nutritional Risks: None Diabetes: Yes CBG done?: No Did pt. bring in CBG monitor from home?: No  Lab Results  Component Value Date   HGBA1C 6.4 (H) 02/12/2024   HGBA1C 6.6 (H) 05/31/2023   HGBA1C 7.1 (H) 11/29/2022     How often do you need to have someone help you when you read instructions, pamphlets, or other written materials from your doctor or pharmacy?: 1 - Never  Interpreter Needed?: No  Information entered by :: Jaunita Messier, CMA   Activities of Daily Living     04/21/2024   11:19 AM 02/12/2024    9:32 AM  In your present state of health, do you have any difficulty performing the following activities:  Hearing? 0 0  Vision? 0 0  Difficulty concentrating or making decisions? 0 0  Walking or climbing stairs? 0 0  Dressing or bathing? 0 0  Doing errands, shopping? 0 0  Preparing Food and eating ? N   Using the Toilet? N   In the past six months, have you accidently leaked urine? N   Do you have problems with loss of bowel control? N   Managing your Medications? N   Managing your Finances? N   Housekeeping or managing your Housekeeping? N      Patient Care Team: Cannady, Jolene T, NP as PCP - General (Nurse Practitioner) Reche Canales, OD (Optometry) Bowdens, Alphonsus Jeans, MD as Consulting Physician (Gastroenterology) Lawerence Pressman, MD as Consulting Physician (Urology) Lemuel Quaker, MD as Referring Physician (Surgical Oncology) Monica Ann, MD (Nephrology)  Indicate any recent Medical Services you may have received from other than [facility] providers in the past year (date may be approximate).     Assessment:    This is a routine wellness examination for [employer].  Hearing/Vision screen Hearing Screening - Comments:: Denies hearing loss Vision Screening - Comments:: Needs DM eye exam, Dr. Barrie Borer, [place]   Goals Addressed             This Visit's Progress    Patient Stated       Get blood sugar controlled and get DM eye exam       Depression Screen     04/21/2024   11:26 AM 02/12/2024    8:58 AM 05/31/2023    8:03 AM 11/29/2022    8:17 AM 05/22/2022    8:56 AM 11/21/2021    9:01 AM 05/22/2021    9:50 AM  PHQ 2/9 Scores  PHQ - 2 Score 0 0 0 0 0 0 0  PHQ- 9 Score 0 0 0 0 0 0     Fall Risk     04/21/2024   11:30 AM 02/12/2024    8:58 AM 05/31/2023    8:03 AM 11/29/2022    8:29 AM 05/22/2022    8:55 AM  Fall Risk   Falls in the past year? 0 0 0 0 0  Number falls in past yr: 0 0 0 0 0  Injury with Fall? 0 0 0 0 0  Risk for fall due to : No Fall Risks No Fall Risks No Fall Risks No Fall Risks No Fall Risks  Follow up Falls evaluation completed Falls evaluation completed Falls evaluation completed Education provided Falls evaluation completed    MEDICARE RISK AT  HOME:  Medicare Risk at Home Any stairs in or around the home?: Yes If so, are there any without handrails?: Yes Home free of loose throw rugs in walkways, pet beds, electrical cords, etc?: Yes Adequate lighting in your home to reduce risk of falls?: Yes Life alert?: No Use of a cane, walker or w/c?: No Grab bars in the bathroom?:  No Shower chair or bench in shower?: No Elevated toilet seat or a handicapped toilet?: No  TIMED UP AND GO:  Was the test performed?  No  Cognitive Function: 6CIT completed        04/21/2024   11:31 AM 11/29/2022    8:44 AM  6CIT Screen  What Year? 0 points 0 points  What month? 0 points 0 points  What time? 0 points 0 points  Count back from 20 0 points 0 points  Months in reverse 2 points 0 points  Repeat phrase 0 points 0 points  Total Score 2 points 0 points    Immunizations Immunization History  Administered Date(s) Administered   Fluad Quad(high Dose 65+) 10/26/2020, 11/21/2021, 11/29/2022   Influenza, High Dose Seasonal PF 09/25/2017, 10/06/2018   Influenza,inj,Quad PF,6+ Mos 10/06/2015, 09/10/2016, 10/07/2019   Influenza-Unspecified 09/24/2012, 09/02/2013, 08/27/2015, 12/26/2023   PFIZER(Purple Top)SARS-COV-2 Vaccination 01/29/2020, 02/22/2020   Pneumococcal Conjugate-13 06/19/2018   Pneumococcal Polysaccharide-23 09/04/2005, 12/07/2019   Td 09/03/2005, 10/06/2015   Zoster Recombinant(Shingrix ) 02/11/2019, 05/18/2019   Zoster, Live 01/06/2013    Screening Tests Health Maintenance  Topic Date Due   OPHTHALMOLOGY EXAM  09/09/2019   COVID-19 Vaccine (3 - 2024-25 season) 07/28/2023   INFLUENZA VACCINE  06/26/2024   HEMOGLOBIN A1C  08/14/2024   Diabetic kidney evaluation - eGFR measurement  02/11/2025   Diabetic kidney evaluation - Urine ACR  02/11/2025   FOOT EXAM  02/11/2025   Medicare Annual Wellness (AWV)  04/21/2025   Colonoscopy  10/04/2025   DTaP/Tdap/Td (3 - Tdap) 10/05/2025   Pneumonia Vaccine 72+ Years old  Completed   Hepatitis C Screening  Completed   Zoster Vaccines- Shingrix   Completed   HPV VACCINES  Aged Out   Meningococcal B Vaccine  Aged Out    Health Maintenance  Health Maintenance Due  Topic Date Due   OPHTHALMOLOGY EXAM  09/09/2019   COVID-19 Vaccine (3 - 2024-25 season) 07/28/2023   Health Maintenance Items Addressed: See Nurse  Notes  Additional Screening:  Vision Screening: Recommended annual ophthalmology exams for early detection of glaucoma and other disorders of the eye.  Dental Screening: Recommended annual dental exams for proper oral hygiene  Community Resource Referral / Chronic Care Management: CRR required this visit?  No   CCM required this visit?  No   Plan:    I have personally reviewed and noted the following in the patient's chart:   Medical and social history Use of alcohol, tobacco or illicit drugs  Current medications and supplements including opioid prescriptions. Patient is not currently taking opioid prescriptions. Functional ability and status Nutritional status Physical activity Advanced directives List of other physicians Hospitalizations, surgeries, and ER visits in previous 12 months Vitals Screenings to include cognitive, depression, and falls Referrals and appointments  In addition, I have reviewed and discussed with patient certain preventive protocols, quality metrics, and best practice recommendations. A written personalized care plan for preventive services as well as general preventive health recommendations were provided to patient.   Jaunita Messier, CMA   04/21/2024   After Visit Summary: (MyChart) Due to this being a telephonic  visit, the after visit summary with patients personalized plan was offered to patient via MyChart   Notes: Please refer to Routing Comments.

## 2024-06-10 ENCOUNTER — Encounter: Payer: Self-pay | Admitting: Urology

## 2024-08-09 NOTE — Patient Instructions (Addendum)
 Over the counter Astepro   Be Involved in Caring For Your Health:  Taking Medications When medications are taken as directed, they can greatly improve your health. But if they are not taken as prescribed, they may not work. In some cases, not taking them correctly can be harmful. To help ensure your treatment remains effective and safe, understand your medications and how to take them. Bring your medications to each visit for review by your provider.  Your lab results, notes, and after visit summary will be available on My Chart. We strongly encourage you to use this feature. If lab results are abnormal the clinic will contact you with the appropriate steps. If the clinic does not contact you assume the results are satisfactory. You can always view your results on My Chart. If you have questions regarding your health or results, please contact the clinic during office hours. You can also ask questions on My Chart.  We at [facility] are grateful that you chose us  to provide your care. We strive to provide evidence-based and compassionate care and are always looking for feedback. If you get a survey from the clinic please complete this so we can hear your opinions.   Diabetes Mellitus and Exercise Regular exercise is important for your health, especially if you have diabetes mellitus. Exercise is not just about losing weight. It can also help you increase muscle strength and bone density and reduce body fat and stress. This can help your level of endurance and make you more fit and flexible. Why should I exercise if I have diabetes? Exercise has many benefits for people with diabetes. It can: Help lower and control your blood sugar (glucose). Help your body respond better and become more sensitive to the hormone insulin. Reduce how much insulin your body needs. Lower your risk for heart disease by: Lowering how much bad cholesterol and triglycerides you have in your body. Increasing  how much good cholesterol you have in your body. Lowering your blood pressure. Lowering your blood glucose levels. What is my activity plan? Your health care provider or an expert trained in diabetes care (certified diabetes educator) can help you make an activity plan. This plan can help you find the type of exercise that works for you. It may also tell you how often to exercise and for how long. Be sure to: Get at least 150 minutes of medium-intensity or high-intensity exercise each week. This may involve brisk walking, biking, or water aerobics. Do stretching and strengthening exercises at least 2 times a week. This may involve yoga or weight lifting. Spread out your activity over at least 3 days of the week. Get some form of physical activity each day. Do not go more than 2 days in a row without some kind of activity. Avoid being inactive for more than 30 minutes at a time. Take frequent breaks to walk or stretch. Choose activities that you enjoy. Set goals that you know you can accomplish. Start slowly and increase the intensity of your exercise over time. How do I manage my diabetes during exercise?  Monitor your blood glucose Check your blood glucose before and after you exercise. If your blood glucose is 240 mg/dL (86.6 mmol/L) or higher before you exercise, check your urine for ketones. These are chemicals created by the liver. If you have ketones in your urine, do not exercise until your blood glucose returns to normal. If your blood glucose is 100 mg/dL (5.6 mmol/L) or lower, eat a snack that  has 15-20 grams of carbohydrate in it. Check your blood glucose 15 minutes after the snack to make sure that your level is above 100 mg/dL (5.6 mmol/L) before you start to exercise. Your risk for low blood glucose (hypoglycemia) goes up during and after exercise. Know the symptoms of this condition and how to treat it. Follow these instructions at home: Keep a carbohydrate snack on hand for use  before, during, and after exercise. This can help prevent or treat hypoglycemia. Avoid injecting insulin into parts of your body that are going to be used during exercise. This may include: Your arms, when you are going to play tennis. Your legs, when you are about to go jogging. Keep track of your exercise habits. This can help you and your health care provider watch and adjust your activity plan. Write down: What you eat before and after you exercise. Blood glucose levels before and after you exercise. The type and amount of exercise you do. Talk to your health care provider before you start a new activity. They may need to: Make sure that the activity is safe for you. Adjust your insulin, other medicines, and food that you eat. Drink water while you exercise. This can stop you from losing too much water (dehydration). It can also prevent problems caused by having a lot of heat in your body (heat stroke). Where to find more information American Diabetes Association: diabetes.org Association of Diabetes Care & Education Specialists: diabeteseducator.org This information is not intended to replace advice given to you by your health care provider. Make sure you discuss any questions you have with your health care provider. Document Revised: 05/02/2022 Document Reviewed: 05/02/2022 Elsevier Patient Education  2024 [employer].

## 2024-08-14 ENCOUNTER — Encounter: Payer: Self-pay | Admitting: Nurse Practitioner

## 2024-08-14 ENCOUNTER — Ambulatory Visit: Admitting: Nurse Practitioner

## 2024-08-14 VITALS — BP 123/70 | HR 78 | Ht 66.0 in | Wt 200.4 lb

## 2024-08-14 DIAGNOSIS — N1832 Chronic kidney disease, stage 3b: Secondary | ICD-10-CM

## 2024-08-14 DIAGNOSIS — Z7984 Long term (current) use of oral hypoglycemic drugs: Secondary | ICD-10-CM

## 2024-08-14 DIAGNOSIS — Z23 Encounter for immunization: Secondary | ICD-10-CM

## 2024-08-14 DIAGNOSIS — E1140 Type 2 diabetes mellitus with diabetic neuropathy, unspecified: Secondary | ICD-10-CM | POA: Diagnosis not present

## 2024-08-14 DIAGNOSIS — N2581 Secondary hyperparathyroidism of renal origin: Secondary | ICD-10-CM

## 2024-08-14 DIAGNOSIS — J301 Allergic rhinitis due to pollen: Secondary | ICD-10-CM

## 2024-08-14 DIAGNOSIS — E66811 Obesity, class 1: Secondary | ICD-10-CM

## 2024-08-14 DIAGNOSIS — G4733 Obstructive sleep apnea (adult) (pediatric): Secondary | ICD-10-CM

## 2024-08-14 DIAGNOSIS — E1159 Type 2 diabetes mellitus with other circulatory complications: Secondary | ICD-10-CM

## 2024-08-14 DIAGNOSIS — K862 Cyst of pancreas: Secondary | ICD-10-CM

## 2024-08-14 DIAGNOSIS — I152 Hypertension secondary to endocrine disorders: Secondary | ICD-10-CM

## 2024-08-14 DIAGNOSIS — E119 Type 2 diabetes mellitus without complications: Secondary | ICD-10-CM

## 2024-08-14 DIAGNOSIS — E1169 Type 2 diabetes mellitus with other specified complication: Secondary | ICD-10-CM

## 2024-08-14 DIAGNOSIS — E6609 Other obesity due to excess calories: Secondary | ICD-10-CM

## 2024-08-14 DIAGNOSIS — E785 Hyperlipidemia, unspecified: Secondary | ICD-10-CM

## 2024-08-14 LAB — BAYER DCA HB A1C WAIVED: HB A1C (BAYER DCA - WAIVED): 6.3 % — ABNORMAL HIGH (ref 4.8–5.6)

## 2024-08-14 MED ORDER — ATORVASTATIN CALCIUM 20 MG PO TABS: 10.0000 mg | ORAL_TABLET | Freq: Every day | ORAL | Status: AC

## 2024-08-14 NOTE — Assessment & Plan Note (Signed)
 Chronic, ongoing.  Recommend he add Astepro  to nasal sprays with his Flonase .  Continue current OTC regimen at home.  May need to return to ENT in future.

## 2024-08-14 NOTE — Assessment & Plan Note (Signed)
 Chronic, ongoing with A1c 6.3% today, trend down.  Continue current medication regimen and adjust as needed, discussed with him to closely monitor BS with this and notify provider if consistent <70.  He wishes to focus on diet and exercise.  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  Monitor use of Glimepiride  due to age and if BS consistently <70 consider alternate options.  Check BS at home as least three mornings a week.  Return in 6 months.  - Needs eye exam.  Foot exam up to date. - Statin and ACE on board. - Vaccines up to date.

## 2024-08-14 NOTE — Assessment & Plan Note (Signed)
Chronic.  100% use of CPAP at home, continue adherent regimen. 

## 2024-08-14 NOTE — Assessment & Plan Note (Signed)
 Chronic, stable.  BP at goal in office and at home.  Continue current medication regimen and adjust as needed.  Recommend checking BP at least three times a week at home.  Focus on DASH diet.  LABS: up to date.  Urine ALB 80 March 2025.  Return in 6 months.

## 2024-08-14 NOTE — Assessment & Plan Note (Signed)
 Chronic, ongoing.  Continue current medication regimen and adjust as needed. Lipid panel today.

## 2024-08-14 NOTE — Assessment & Plan Note (Signed)
 Chronic, ongoing with A1c 6.3% today, trend down with some weight loss.  Continue current medication regimen and adjust as needed, discussed with him to closely monitor BS with this and notify provider if consistent <70.  He wishes to focus on diet and exercise.  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  Monitor use of Glimepiride  due to age and if BS consistently <70 consider alternate options.  Check BS at home as least three mornings a week.  Return in 6 months.  - Needs eye exam.  Foot exam up to date. - Statin and ACE on board. - Vaccines up to date.

## 2024-08-14 NOTE — Assessment & Plan Note (Signed)
Chronic, ongoing.  Followed by nephrology at [facility], recent notes and labs reviewed. 

## 2024-08-14 NOTE — Assessment & Plan Note (Signed)
BMI 32.35.  Recommended eating smaller high protein, low fat meals more frequently and exercising 30 mins a day 5 times a week with a goal of 10-15lb weight loss in the next 3 months. Patient voiced their understanding and motivation to adhere to these recommendations.  

## 2024-08-14 NOTE — Assessment & Plan Note (Signed)
 Chronic, ongoing.  Monitored by nephrology at [facility], continue collaboration and Lisinopril  at this time.  Recent notes reviewed.  Labs up to date.

## 2024-08-14 NOTE — Progress Notes (Signed)
 BP 123/70 (BP Location: Left Arm, Patient Position: Sitting, Cuff Size: Large)   Pulse 78   Ht 5' 6 (1.676 m)   Wt 200 lb 6.4 oz (90.9 kg)   SpO2 98%   BMI 32.35 kg/m    Subjective:    Patient ID: Thomas Huang, male    DOB: Aug 18, 1951, 73 y.o.   MRN: [id number]  HPI: Thomas Huang is a 73 y.o. male  Chief Complaint  Patient presents with   Hypertension   Diabetes   Allergic Reaction    Constant drainage in the nose like water, all year round , prior treatment is allegra D   DIABETES March 6.4% A1c.  Takes Glimepiride  2 MG BID, occasionally takes once a day dependent on sugars.  Continues Alpha Lipoic Acid + B12 for foot neuropathy, L>R. Hypoglycemic episodes:no Polydipsia/polyuria: no Visual disturbance: no Chest pain: no Paresthesias: no Glucose Monitoring: yes             Accucheck frequency: none recent             Fasting glucose:              Post prandial:              Evening:             Before meals:  Taking Insulin?: no             Long acting insulin:             Short acting insulin: Blood Pressure Monitoring: daily Retinal Examination: Not Up to Date -- recommend he obtain Foot Exam: Up to Date Pneumovax: Up to Date Influenza: Up to Date Aspirin: no    HYPERTENSION / HYPERLIPIDEMIA Continues Lisinopril  and Atorvastatin  (takes 1/2 tablet only). Tried higher doses of statin and different statins in past with joint pain.  Uses CPAP 100% at home for OSA. Echo was 03/28/21 with EF >55%.  Saw cardiology in 2017 and has not returned, heart murmur is present.   Satisfied with current treatment? yes Duration of hypertension: chronic BP monitoring frequency: occasional BP range: < 130/80 on average BP medication side effects: no Duration of hyperlipidemia: chronic Cholesterol medication side effects: no Cholesterol supplements: none Medication compliance: good compliance Aspirin: no Recent stressors: no Recurrent headaches: no Visual changes:  no Palpitations: no Dyspnea: no Chest pain: no Lower extremity edema: no Dizzy/lightheaded: has vertigo at baseline   CHRONIC KIDNEY DISEASE Last saw nephrology 05/25/24. eGFR 40, creatinine 1.79. CKD status: stable Medications renally dose: yes Previous renal evaluation: yes Pneumovax:  Up to Date Influenza Vaccine:  Up to Date   ALLERGIES Takes Allegra D as needed.  Symptoms have been worse this year. Saw ENT in past. Duration: chronic Runny nose: yes clear Nasal congestion: no Nasal itching: no Sneezing: no Eye swelling, itching or discharge: in spring Post nasal drip: yes Cough: occasional Sinus pressure: no  Ear pain: none Ear pressure: occasional Fever: none Symptoms occur seasonally: no Symptoms occur perenially: yes -- spring is the worst Satisfied with current treatment: yes Allergist evaluation in past: yes Allergen injection immunotherapy: yes Recurrent sinus infections: no ENT evaluation in past: yes Known environmental allergy: yes Indoor pets: yes History of asthma: no Current allergy medications: allegra D and flonase  Treatments attempted: multiple medications   Relevant past medical, surgical, family and social history reviewed and updated as indicated. Interim medical history since our last visit reviewed. Allergies and medications reviewed and updated.  Review  of Systems  Constitutional:  Negative for activity change, diaphoresis, fatigue and fever.  HENT:  Positive for postnasal drip and rhinorrhea.   Respiratory:  Negative for cough, chest tightness, shortness of breath and wheezing.   Cardiovascular:  Negative for chest pain, palpitations and leg swelling.  Gastrointestinal: Negative.   Endocrine: Negative for polydipsia, polyphagia and polyuria.  Neurological: Negative.   Psychiatric/Behavioral: Negative.      Per HPI unless specifically indicated above     Objective:    BP 123/70 (BP Location: Left Arm, Patient Position: Sitting,  Cuff Size: Large)   Pulse 78   Ht 5' 6 (1.676 m)   Wt 200 lb 6.4 oz (90.9 kg)   SpO2 98%   BMI 32.35 kg/m   Wt Readings from Last 3 Encounters:  08/14/24 200 lb 6.4 oz (90.9 kg)  04/21/24 200 lb (90.7 kg)  02/12/24 201 lb 6.4 oz (91.4 kg)    Physical Exam Vitals and nursing note reviewed.  Constitutional:      General: He is awake. He is not in acute distress.    Appearance: He is well-developed and well-groomed. He is obese. He is not ill-appearing or toxic-appearing.  HENT:     Head: Normocephalic and atraumatic.     Right Ear: Hearing, tympanic membrane, ear canal and external ear normal. No drainage. No middle ear effusion. There is no impacted cerumen. Tympanic membrane is not injected.     Left Ear: Hearing, tympanic membrane, ear canal and external ear normal. No drainage.  No middle ear effusion. There is no impacted cerumen. Tympanic membrane is not injected.     Nose: Rhinorrhea present. Rhinorrhea is clear.     Right Sinus: No maxillary sinus tenderness or frontal sinus tenderness.     Left Sinus: No maxillary sinus tenderness or frontal sinus tenderness.     Mouth/Throat:     Mouth: Mucous membranes are moist.     Pharynx: Posterior oropharyngeal erythema (mild) present. No pharyngeal swelling or oropharyngeal exudate.  Eyes:     General: Lids are normal.        Right eye: No discharge.        Left eye: No discharge.     Conjunctiva/sclera: Conjunctivae normal.     Pupils: Pupils are equal, round, and reactive to light.  Neck:     Vascular: No carotid bruit.  Cardiovascular:     Rate and Rhythm: Normal rate and regular rhythm.     Heart sounds: S1 normal and S2 normal. Murmur heard.     Systolic murmur is present with a grade of 2/6.     No gallop.  Pulmonary:     Effort: Pulmonary effort is normal. No accessory muscle usage or respiratory distress.     Breath sounds: Normal breath sounds.  Abdominal:     General: Bowel sounds are normal.     Palpations:  Abdomen is soft.  Musculoskeletal:        General: Normal range of motion.     Cervical back: Normal range of motion and neck supple.     Right lower leg: No edema.     Left lower leg: No edema.  Skin:    General: Skin is warm and dry.     Capillary Refill: Capillary refill takes less than 2 seconds.  Neurological:     Mental Status: He is alert and oriented to person, place, and time.  Psychiatric:        Attention and Perception: Attention normal.  Mood and Affect: Mood normal.        Speech: Speech normal.        Behavior: Behavior normal. Behavior is cooperative.        Thought Content: Thought content normal.     Results for orders placed or performed in visit on 02/12/24  Bayer DCA Hb A1c Waived   Collection Time: 02/12/24  9:14 AM  Result Value Ref Range   HB A1C (BAYER DCA - WAIVED) 6.4 (H) 4.8 - 5.6 %  Microalbumin, Urine Waived   Collection Time: 02/12/24  9:14 AM  Result Value Ref Range   Microalb, Ur Waived 80 (H) 0 - 19 mg/L   Creatinine, Urine Waived 200 10 - 300 mg/dL   Microalb/Creat Ratio 30-300 (H) <30 mg/g  CBC with Differential/Platelet   Collection Time: 02/12/24  9:15 AM  Result Value Ref Range   WBC 5.3 3.4 - 10.8 x10E3/uL   RBC 3.83 (L) 4.14 - 5.80 x10E6/uL   Hemoglobin 11.7 (L) 13.0 - 17.7 g/dL   Hematocrit 64.0 (L) 62.4 - 51.0 %   MCV 94 79 - 97 fL   MCH 30.5 26.6 - 33.0 pg   MCHC 32.6 31.5 - 35.7 g/dL   RDW 86.2 88.3 - 84.5 %   Platelets 141 (L) 150 - 450 x10E3/uL   Neutrophils 61 Not Estab. %   Lymphs 21 Not Estab. %   Monocytes 9 Not Estab. %   Eos 8 Not Estab. %   Basos 1 Not Estab. %   Neutrophils Absolute 3.2 1.4 - 7.0 x10E3/uL   Lymphocytes Absolute 1.1 0.7 - 3.1 x10E3/uL   Monocytes Absolute 0.5 0.1 - 0.9 x10E3/uL   EOS (ABSOLUTE) 0.4 0.0 - 0.4 x10E3/uL   Basophils Absolute 0.1 0.0 - 0.2 x10E3/uL   Immature Granulocytes 0 Not Estab. %   Immature Grans (Abs) 0.0 0.0 - 0.1 x10E3/uL  Comprehensive metabolic panel    Collection Time: 02/12/24  9:15 AM  Result Value Ref Range   Glucose 110 (H) 70 - 99 mg/dL   BUN 25 8 - 27 mg/dL   Creatinine, Ser 8.12 (H) 0.76 - 1.27 mg/dL   eGFR 38 (L) >40 fO/fpw/8.26   BUN/Creatinine Ratio 13 10 - 24   Sodium 142 134 - 144 mmol/L   Potassium 4.5 3.5 - 5.2 mmol/L   Chloride 109 (H) 96 - 106 mmol/L   CO2 19 (L) 20 - 29 mmol/L   Calcium  8.8 8.6 - 10.2 mg/dL   Total Protein 6.4 6.0 - 8.5 g/dL   Albumin 4.1 3.8 - 4.8 g/dL   Globulin, Total 2.3 1.5 - 4.5 g/dL   Bilirubin Total 0.3 0.0 - 1.2 mg/dL   Alkaline Phosphatase 65 44 - 121 IU/L   AST 20 0 - 40 IU/L   ALT 21 0 - 44 IU/L  TSH   Collection Time: 02/12/24  9:15 AM  Result Value Ref Range   TSH 2.380 0.450 - 4.500 uIU/mL  Lipid Panel w/o Chol/HDL Ratio   Collection Time: 02/12/24  9:15 AM  Result Value Ref Range   Cholesterol, Total 103 100 - 199 mg/dL   Triglycerides 69 0 - 149 mg/dL   HDL 35 (L) >60 mg/dL   VLDL Cholesterol Cal 15 5 - 40 mg/dL   LDL Chol Calc (NIH) 53 0 - 99 mg/dL  Magnesium   Collection Time: 02/12/24  9:15 AM  Result Value Ref Range   Magnesium 2.1 1.6 - 2.3 mg/dL  Vitamin B12  Collection Time: 02/12/24  9:15 AM  Result Value Ref Range   Vitamin B-12 1,297 (H) 232 - 1,245 pg/mL      Assessment & Plan:   Problem List Items Addressed This Visit       Cardiovascular and Mediastinum   Hypertension associated with diabetes (HCC)   Chronic, stable.  BP at goal in office and at home.  Continue current medication regimen and adjust as needed.  Recommend checking BP at least three times a week at home.  Focus on DASH diet.  LABS: up to date.  Urine ALB 80 March 2025.  Return in 6 months.      Relevant Medications   atorvastatin  (LIPITOR) 20 MG tablet   Other Relevant Orders   Bayer DCA Hb A1c Waived     Respiratory   OSA on CPAP   Chronic.  100% use of CPAP at home, continue adherent regimen.      Allergic rhinitis   Chronic, ongoing.  Recommend he add Astepro  to nasal  sprays with his Flonase .  Continue current OTC regimen at home.  May need to return to ENT in future.        Endocrine   Hyperparathyroidism due to renal insufficiency (HCC)   Chronic, ongoing.  Followed by nephrology at [facility], recent notes and labs reviewed.      Relevant Orders   Bayer DCA Hb A1c Waived   Hyperlipidemia associated with type 2 diabetes mellitus (HCC)   Chronic, ongoing.  Continue current medication regimen and adjust as needed.  Lipid panel today.      Relevant Medications   atorvastatin  (LIPITOR) 20 MG tablet   Other Relevant Orders   Bayer DCA Hb A1c Waived   Lipid Panel w/o Chol/HDL Ratio   Diabetes mellitus treated with oral medication (HCC)   Chronic, ongoing with A1c 6.3% today, trend down.  Continue current medication regimen and adjust as needed, discussed with him to closely monitor BS with this and notify provider if consistent <70.  He wishes to focus on diet and exercise.  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  Monitor use of Glimepiride  due to age and if BS consistently <70 consider alternate options.  Check BS at home as least three mornings a week.  Return in 6 months.  - Needs eye exam.  Foot exam up to date. - Statin and ACE on board. - Vaccines up to date.      Relevant Medications   atorvastatin  (LIPITOR) 20 MG tablet   Other Relevant Orders   Bayer DCA Hb A1c Waived   Controlled type 2 diabetes mellitus with diabetic neuropathy, without long-term current use of insulin (HCC) - Primary   Chronic, ongoing with A1c 6.3% today, trend down with some weight loss.  Continue current medication regimen and adjust as needed, discussed with him to closely monitor BS with this and notify provider if consistent <70.  He wishes to focus on diet and exercise.  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  Monitor use of Glimepiride  due to age and if BS consistently <70 consider alternate options.  Check BS at home as least three  mornings a week.  Return in 6 months.  - Needs eye exam.  Foot exam up to date. - Statin and ACE on board. - Vaccines up to date.      Relevant Medications   atorvastatin  (LIPITOR) 20 MG tablet   Other Relevant Orders   Bayer DCA Hb A1c Waived  Genitourinary   CKD (chronic kidney disease), stage III (HCC)   Chronic, ongoing.  Monitored by nephrology at [facility], continue collaboration and Lisinopril  at this time.  Recent notes reviewed.  Labs up to date.        Other   Obesity   BMI 32.35.  Recommended eating smaller high protein, low fat meals more frequently and exercising 30 mins a day 5 times a week with a goal of 10-15lb weight loss in the next 3 months. Patient voiced their understanding and motivation to adhere to these recommendations.       Relevant Medications   sodium bicarbonate 650 MG tablet     Follow up plan: Return in about 6 months (around 02/11/2025) for Annual Physical -- after 02/11/25.

## 2024-08-15 ENCOUNTER — Ambulatory Visit: Payer: Self-pay | Admitting: Nurse Practitioner

## 2024-08-15 LAB — LIPID PANEL W/O CHOL/HDL RATIO
Cholesterol, Total: 134 mg/dL (ref 100–199)
HDL: 38 mg/dL — ABNORMAL LOW (ref >39)
LDL Chol Calc (NIH): 78 mg/dL (ref 0–99)
Triglycerides: 92 mg/dL (ref 0–149)
VLDL Cholesterol Cal: 18 mg/dL (ref 5–40)

## 2024-08-15 NOTE — Progress Notes (Signed)
 Contacted via MyChart  Good evening Thomas Huang, labs have returned and overall are stable.  No changes needed. Great job!! Keep being amazing!!  Thank you for allowing me to participate in your care.  I appreciate you. Kindest regards, Colyn Miron

## 2024-08-24 ENCOUNTER — Other Ambulatory Visit: Payer: Self-pay

## 2024-08-24 DIAGNOSIS — N200 Calculus of kidney: Secondary | ICD-10-CM

## 2024-08-25 ENCOUNTER — Ambulatory Visit
Admission: RE | Admit: 2024-08-25 | Discharge: 2024-08-25 | Disposition: A | Discharge status: Home / Self Care | Attending: Urology | Admitting: Urology

## 2024-08-25 ENCOUNTER — Ambulatory Visit: Admitting: Urology

## 2024-08-25 ENCOUNTER — Ambulatory Visit
Admission: RE | Admit: 2024-08-25 | Discharge: 2024-08-25 | Disposition: A | Discharge status: Home / Self Care | Source: Ambulatory Visit | Attending: Urology | Admitting: Urology

## 2024-08-25 VITALS — BP 134/73 | HR 89 | Ht 66.0 in | Wt 201.6 lb

## 2024-08-25 DIAGNOSIS — N401 Enlarged prostate with lower urinary tract symptoms: Secondary | ICD-10-CM

## 2024-08-25 DIAGNOSIS — N400 Enlarged prostate without lower urinary tract symptoms: Secondary | ICD-10-CM | POA: Diagnosis not present

## 2024-08-25 DIAGNOSIS — N138 Other obstructive and reflux uropathy: Secondary | ICD-10-CM | POA: Diagnosis not present

## 2024-08-25 DIAGNOSIS — N3281 Overactive bladder: Secondary | ICD-10-CM

## 2024-08-25 DIAGNOSIS — N200 Calculus of kidney: Secondary | ICD-10-CM

## 2024-08-25 DIAGNOSIS — R3911 Hesitancy of micturition: Secondary | ICD-10-CM

## 2024-08-25 LAB — BLADDER SCAN AMB NON-IMAGING

## 2024-08-25 MED ORDER — TAMSULOSIN HCL 0.4 MG PO CAPS: 0.4000 mg | ORAL_CAPSULE | Freq: Every day | ORAL | 4 refills | Status: AC

## 2024-08-25 MED ORDER — OXYBUTYNIN CHLORIDE ER 10 MG PO TB24: 10.0000 mg | ORAL_TABLET | Freq: Every day | ORAL | 11 refills | Status: AC

## 2024-08-25 MED ORDER — FINASTERIDE 5 MG PO TABS: 5.0000 mg | ORAL_TABLET | Freq: Every day | ORAL | 4 refills | Status: AC

## 2024-08-25 NOTE — Progress Notes (Signed)
   08/25/2024 10:09 AM   Thomas Huang [birth year] [id number]  Reason for visit: Follow up BPH/LUTS, nephrolithiasis, ED, atrophic left kidney  History: Atrophic left kidney, known right nonobstructing renal stones present at least 10 years, history of ureteroscopy with Dr. Twylla, has deferred treatment for nonobstructing renal stones and essentially solitary kidney but has deferred On maximal medical therapy with Flomax  and finasteride  for BPH  Physical Exam: BP 134/73 (BP Location: Left Arm, Patient Position: Sitting, Cuff Size: Large)   Pulse 89   Ht 5' 6 (1.676 m)   Wt 201 lb 9.6 oz (91.4 kg)   SpO2 96%   BMI 32.54 kg/m   Imaging/labs: MRI August 2025 at [facility] with 1.7 cm stable right renal pelvis stone, no hydronephrosis Renal function stable, creatinine 1.79, eGFR 40 I personally viewed and interpreted the KUB today that appears to show a stable 1.5 cm right renal pelvis stone  Today: Denies any stone events or gross hematuria, no flank pain Worsening urgency, occasional urge incontinence and interested in other options PVR today normal at 0ml  Plan:   BPH/LUTS: Emptying well, continue Flomax  and finasteride  for maximal medical therapy OAB: Interested in trial of medication, oxybutynin 10 mg XL, risks and benefits discussed Nephrolithiasis: Essentially solitary right kidney, 1.7 cm renal pelvis stone, I recommended CT for further evaluation and consideration of treatment based on solitary kidney but he defers.  Risks and benefits were discussed extensively and return precautions discussed including flank pain, and urea, gross hematuria RTC 1 year PVR, KUB prior   Redell JAYSON Burnet, MD  [facility] [address] [place], [place] [address] [phone] [address]

## 2024-08-26 ENCOUNTER — Ambulatory Visit: Payer: Self-pay | Admitting: Urology

## 2024-09-25 NOTE — Progress Notes (Signed)
 RAYWOOD WAILES                                          MRN: [id number]   09/25/2024   The [facility] Quality Team Specialist reviewed this patient medical record for the purposes of chart review for care gap closure. The following were reviewed: chart review for care gap closure-diabetic eye exam.    [facility] Quality Team

## 2025-02-11 ENCOUNTER — Ambulatory Visit: Admitting: Nurse Practitioner

## 2025-04-27 ENCOUNTER — Ambulatory Visit

## 2025-08-26 ENCOUNTER — Ambulatory Visit: Admitting: Urology
